# Patient Record
Sex: Female | Born: 1970
Health system: Southern US, Community
[De-identification: ages and names within clinical notes are randomized; demographics above are authoritative.]

## PROBLEM LIST (undated history)

## (undated) DIAGNOSIS — F419 Anxiety disorder, unspecified: Secondary | ICD-10-CM

## (undated) DIAGNOSIS — F329 Major depressive disorder, single episode, unspecified: Secondary | ICD-10-CM

## (undated) DIAGNOSIS — L309 Dermatitis, unspecified: Secondary | ICD-10-CM

## (undated) DIAGNOSIS — IMO0002 Reserved for concepts with insufficient information to code with codable children: Secondary | ICD-10-CM

## (undated) DIAGNOSIS — I10 Essential (primary) hypertension: Secondary | ICD-10-CM

## (undated) DIAGNOSIS — E785 Hyperlipidemia, unspecified: Secondary | ICD-10-CM

## (undated) DIAGNOSIS — M1990 Unspecified osteoarthritis, unspecified site: Secondary | ICD-10-CM

## (undated) DIAGNOSIS — F32A Depression, unspecified: Secondary | ICD-10-CM

## (undated) DIAGNOSIS — E7800 Pure hypercholesterolemia, unspecified: Secondary | ICD-10-CM

## (undated) DIAGNOSIS — E0590 Thyrotoxicosis, unspecified without thyrotoxic crisis or storm: Secondary | ICD-10-CM

## (undated) DIAGNOSIS — R87619 Unspecified abnormal cytological findings in specimens from cervix uteri: Secondary | ICD-10-CM

## (undated) HISTORY — DX: Dermatitis, unspecified: L30.9

## (undated) HISTORY — DX: Hyperlipidemia, unspecified: E78.5

## (undated) HISTORY — DX: Unspecified abnormal cytological findings in specimens from cervix uteri: R87.619

## (undated) HISTORY — PX: WISDOM TOOTH EXTRACTION: SHX21

## (undated) HISTORY — DX: Pure hypercholesterolemia, unspecified: E78.00

## (undated) HISTORY — DX: Essential (primary) hypertension: I10

## (undated) HISTORY — DX: Reserved for concepts with insufficient information to code with codable children: IMO0002

## (undated) HISTORY — DX: Thyrotoxicosis, unspecified without thyrotoxic crisis or storm: E05.90

## (undated) HISTORY — PX: GYNECOLOGIC CRYOSURGERY: SHX857

---

## 1998-06-30 ENCOUNTER — Other Ambulatory Visit: Admission: RE | Admit: 1998-06-30 | Discharge: 1998-06-30 | Payer: Self-pay | Admitting: Obstetrics and Gynecology

## 1999-09-14 ENCOUNTER — Other Ambulatory Visit: Admission: RE | Admit: 1999-09-14 | Discharge: 1999-09-14 | Payer: Self-pay | Admitting: Obstetrics and Gynecology

## 2000-01-24 ENCOUNTER — Other Ambulatory Visit: Admission: RE | Admit: 2000-01-24 | Discharge: 2000-01-24 | Payer: Self-pay | Admitting: Obstetrics and Gynecology

## 2000-08-22 ENCOUNTER — Inpatient Hospital Stay (HOSPITAL_COMMUNITY): Admission: AD | Admit: 2000-08-22 | Discharge: 2000-08-22 | Payer: Self-pay | Admitting: Obstetrics and Gynecology

## 2000-12-25 ENCOUNTER — Inpatient Hospital Stay (HOSPITAL_COMMUNITY): Admission: AD | Admit: 2000-12-25 | Discharge: 2000-12-28 | Payer: Self-pay | Admitting: Obstetrics and Gynecology

## 2001-01-28 ENCOUNTER — Other Ambulatory Visit: Admission: RE | Admit: 2001-01-28 | Discharge: 2001-01-28 | Payer: Self-pay | Admitting: Obstetrics and Gynecology

## 2002-02-23 ENCOUNTER — Other Ambulatory Visit: Admission: RE | Admit: 2002-02-23 | Discharge: 2002-02-23 | Payer: Self-pay | Admitting: Obstetrics and Gynecology

## 2002-10-11 ENCOUNTER — Other Ambulatory Visit: Admission: RE | Admit: 2002-10-11 | Discharge: 2002-10-11 | Payer: Self-pay | Admitting: Obstetrics and Gynecology

## 2003-03-17 ENCOUNTER — Inpatient Hospital Stay (HOSPITAL_COMMUNITY): Admission: AD | Admit: 2003-03-17 | Discharge: 2003-03-17 | Payer: Self-pay | Admitting: Obstetrics and Gynecology

## 2003-04-08 ENCOUNTER — Inpatient Hospital Stay (HOSPITAL_COMMUNITY): Admission: AD | Admit: 2003-04-08 | Discharge: 2003-04-11 | Payer: Self-pay | Admitting: Obstetrics and Gynecology

## 2003-05-17 ENCOUNTER — Other Ambulatory Visit: Admission: RE | Admit: 2003-05-17 | Discharge: 2003-05-17 | Payer: Self-pay | Admitting: Obstetrics and Gynecology

## 2004-07-20 ENCOUNTER — Other Ambulatory Visit: Admission: RE | Admit: 2004-07-20 | Discharge: 2004-07-20 | Payer: Self-pay | Admitting: Obstetrics and Gynecology

## 2004-08-03 ENCOUNTER — Ambulatory Visit: Payer: Self-pay | Admitting: Family Medicine

## 2004-12-18 ENCOUNTER — Other Ambulatory Visit: Admission: RE | Admit: 2004-12-18 | Discharge: 2004-12-18 | Payer: Self-pay | Admitting: Obstetrics and Gynecology

## 2004-12-18 LAB — HM HIV SCREENING LAB: HM HIV Screening: NEGATIVE

## 2004-12-18 LAB — HM HEPATITIS C SCREENING LAB: HM Hepatitis Screen: NEGATIVE

## 2004-12-18 LAB — CHLAMYDIA SCREEN: Chlamydia, DNA Probe: NEGATIVE

## 2009-12-13 ENCOUNTER — Emergency Department (HOSPITAL_COMMUNITY): Admission: EM | Admit: 2009-12-13 | Discharge: 2009-12-13 | Payer: Self-pay | Admitting: Family Medicine

## 2010-04-07 ENCOUNTER — Encounter: Payer: Self-pay | Admitting: Obstetrics and Gynecology

## 2010-08-03 NOTE — Op Note (Signed)
[name], Angela Wells                             ACCOUNT NO.:  [id number]   MEDICAL RECORD NO.:  [id number]                   PATIENT TYPE:  INP   LOCATION:  9311                                 FACILITY:  [facility]   PHYSICIAN:  Dineen Kid. Rana Snare, M.D.                 DATE OF BIRTH:  28-Nov-1970   DATE OF PROCEDURE:  04/09/2003  DATE OF DISCHARGE:                                 OPERATIVE REPORT   DELIVERY NOTE:  The patient presented at 37-1/2 weeks in early labor.  He was given IV  antibiotics.  Labor was augmented with rupture of membranes.  She progressed  to complete.  The infant was in the right occipitoposterior presentation.  She pushed approximately one hour.  Fetal heart rate now tachycardic in the  190s-200s with variable decelerations.  The patient does have group B  Streptococcus, but has been given IV antibiotics.  The vertex was manually  rotated from ROP to LOA, +3 station.  Continued with deep variables and  fetal tachycardia.  With maternal exhaustion, the patient requested vacuum  extractor.  The risks and benefits were discussed at length.  Informed  consent was obtained.  The Mityvac vacuum extractor was applied and with one  very easy pull delivered a viable female infant.  Apgars were 8 and 8.  The  placenta delivered spontaneously intact with a three-vessel cord.  No  lacerations were noted.  Mother and baby were doing well.  The estimated  blood loss was 400 mL.  The arterial pH returned at 7.04.                                               Dineen Kid Rana Snare, M.D.    DCL/MEDQ  D:  04/10/2003  T:  04/10/2003  Job:  [id number]

## 2010-08-17 HISTORY — PX: IUD REMOVAL: SHX5392

## 2010-10-23 ENCOUNTER — Other Ambulatory Visit: Payer: Self-pay | Admitting: Obstetrics & Gynecology

## 2010-10-23 DIAGNOSIS — Z1231 Encounter for screening mammogram for malignant neoplasm of breast: Secondary | ICD-10-CM

## 2011-02-05 ENCOUNTER — Encounter (HOSPITAL_COMMUNITY): Payer: Self-pay | Admitting: *Deleted

## 2011-02-25 ENCOUNTER — Ambulatory Visit (HOSPITAL_COMMUNITY)
Admission: RE | Admit: 2011-02-25 | Discharge: 2011-02-25 | Disposition: A | Discharge status: Home / Self Care | Payer: 59 | Source: Ambulatory Visit | Attending: Obstetrics & Gynecology | Admitting: Obstetrics & Gynecology

## 2011-02-25 DIAGNOSIS — Z1231 Encounter for screening mammogram for malignant neoplasm of breast: Secondary | ICD-10-CM | POA: Insufficient documentation

## 2011-02-26 ENCOUNTER — Encounter (HOSPITAL_COMMUNITY): Payer: Self-pay

## 2011-03-03 MED ORDER — CEFAZOLIN SODIUM-DEXTROSE 2-3 GM-% IV SOLR
2.0000 g | INTRAVENOUS | Status: AC
  Administered 2011-03-04: 2 g via INTRAVENOUS
  Filled 2011-03-03: qty 50

## 2011-03-04 ENCOUNTER — Encounter (HOSPITAL_COMMUNITY): Payer: Self-pay | Admitting: Anesthesiology

## 2011-03-04 ENCOUNTER — Ambulatory Visit (HOSPITAL_COMMUNITY): Payer: 59 | Admitting: Anesthesiology

## 2011-03-04 ENCOUNTER — Ambulatory Visit (HOSPITAL_COMMUNITY)
Admission: RE | Admit: 2011-03-04 | Discharge: 2011-03-04 | Disposition: A | Discharge status: Home / Self Care | Payer: 59 | Source: Ambulatory Visit | Attending: Obstetrics & Gynecology | Admitting: Obstetrics & Gynecology

## 2011-03-04 ENCOUNTER — Encounter (HOSPITAL_COMMUNITY)
Admission: RE | Disposition: A | Discharge status: Home / Self Care | Payer: Self-pay | Source: Ambulatory Visit | Attending: Obstetrics & Gynecology

## 2011-03-04 ENCOUNTER — Encounter (HOSPITAL_COMMUNITY): Payer: Self-pay | Admitting: *Deleted

## 2011-03-04 DIAGNOSIS — Z302 Encounter for sterilization: Secondary | ICD-10-CM

## 2011-03-04 HISTORY — DX: Anxiety disorder, unspecified: F41.9

## 2011-03-04 HISTORY — PX: LAPAROSCOPIC TUBAL LIGATION: SHX1937

## 2011-03-04 HISTORY — PX: TUBAL LIGATION: SHX77

## 2011-03-04 LAB — CBC
Hemoglobin: 14.1 g/dL (ref 12.0–15.0)
Platelets: 266 10*3/uL (ref 150–400)
RBC: 4.59 MIL/uL (ref 3.87–5.11)
WBC: 8.6 10*3/uL (ref 4.0–10.5)

## 2011-03-04 LAB — HCG, SERUM, QUALITATIVE: Preg, Serum: NEGATIVE

## 2011-03-04 SURGERY — ESSURE TUBAL STERILIZATION
Anesthesia: General | Site: Uterus | Laterality: Bilateral | Wound class: Clean Contaminated

## 2011-03-04 MED ORDER — ROCURONIUM BROMIDE 50 MG/5ML IV SOLN
INTRAVENOUS | Status: AC
  Filled 2011-03-04: qty 1

## 2011-03-04 MED ORDER — LACTATED RINGERS IV SOLN
INTRAVENOUS | Status: DC
  Administered 2011-03-04 (×3): via INTRAVENOUS

## 2011-03-04 MED ORDER — FENTANYL CITRATE 0.05 MG/ML IJ SOLN
25.0000 ug | INTRAMUSCULAR | Status: DC | PRN
  Administered 2011-03-04: 50 ug via INTRAVENOUS
  Administered 2011-03-04: 25 ug via INTRAVENOUS
  Administered 2011-03-04: 50 ug via INTRAVENOUS
  Administered 2011-03-04: 25 ug via INTRAVENOUS

## 2011-03-04 MED ORDER — NEOSTIGMINE METHYLSULFATE 1 MG/ML IJ SOLN
INTRAMUSCULAR | Status: DC | PRN
  Administered 2011-03-04: 4 mg via INTRAVENOUS

## 2011-03-04 MED ORDER — PROPOFOL 10 MG/ML IV BOLUS
INTRAVENOUS | Status: DC | PRN
  Administered 2011-03-04: 120 mg via INTRAVENOUS
  Administered 2011-03-04: 80 mg via INTRAVENOUS
  Administered 2011-03-04: 180 mg via INTRAVENOUS

## 2011-03-04 MED ORDER — FENTANYL CITRATE 0.05 MG/ML IJ SOLN
INTRAMUSCULAR | Status: AC
  Filled 2011-03-04: qty 2

## 2011-03-04 MED ORDER — KETOROLAC TROMETHAMINE 30 MG/ML IJ SOLN
INTRAMUSCULAR | Status: AC
  Filled 2011-03-04: qty 1

## 2011-03-04 MED ORDER — GLYCOPYRROLATE 0.2 MG/ML IJ SOLN
INTRAMUSCULAR | Status: DC | PRN
  Administered 2011-03-04: .07 mg via INTRAVENOUS

## 2011-03-04 MED ORDER — GLYCOPYRROLATE 0.2 MG/ML IJ SOLN
INTRAMUSCULAR | Status: AC
  Filled 2011-03-04: qty 2

## 2011-03-04 MED ORDER — ROCURONIUM BROMIDE 100 MG/10ML IV SOLN
INTRAVENOUS | Status: DC | PRN
  Administered 2011-03-04: 40 mg via INTRAVENOUS

## 2011-03-04 MED ORDER — KETOROLAC TROMETHAMINE 30 MG/ML IJ SOLN
INTRAMUSCULAR | Status: DC | PRN
  Administered 2011-03-04: 30 mg via INTRAVENOUS

## 2011-03-04 MED ORDER — ONDANSETRON HCL 4 MG/2ML IJ SOLN
INTRAMUSCULAR | Status: AC
  Filled 2011-03-04: qty 2

## 2011-03-04 MED ORDER — FENTANYL CITRATE 0.05 MG/ML IJ SOLN
INTRAMUSCULAR | Status: AC
  Administered 2011-03-04: 25 ug via INTRAVENOUS
  Filled 2011-03-04: qty 2

## 2011-03-04 MED ORDER — KETOROLAC TROMETHAMINE 30 MG/ML IJ SOLN: 15.0000 mg | Freq: Once | INTRAMUSCULAR | Status: DC | PRN

## 2011-03-04 MED ORDER — PROPOFOL 10 MG/ML IV EMUL
INTRAVENOUS | Status: AC
  Filled 2011-03-04: qty 20

## 2011-03-04 MED ORDER — LIDOCAINE-EPINEPHRINE 1 %-1:100000 IJ SOLN
INTRAMUSCULAR | Status: DC | PRN
  Administered 2011-03-04: 10 mL

## 2011-03-04 MED ORDER — SODIUM CHLORIDE 0.9 % IR SOLN
Status: DC | PRN
  Administered 2011-03-04: 3000 mL

## 2011-03-04 MED ORDER — FENTANYL CITRATE 0.05 MG/ML IJ SOLN
INTRAMUSCULAR | Status: DC | PRN
  Administered 2011-03-04: 100 ug via INTRAVENOUS

## 2011-03-04 MED ORDER — HYDROCODONE-ACETAMINOPHEN 5-325 MG PO TABS
1.0000 | ORAL_TABLET | Freq: Once | ORAL | Status: AC
  Administered 2011-03-04: 1 via ORAL

## 2011-03-04 MED ORDER — LIDOCAINE HCL (CARDIAC) 20 MG/ML IV SOLN
INTRAVENOUS | Status: AC
  Filled 2011-03-04: qty 5

## 2011-03-04 MED ORDER — DEXAMETHASONE SODIUM PHOSPHATE 4 MG/ML IJ SOLN
INTRAMUSCULAR | Status: DC | PRN
  Administered 2011-03-04: 10 mg via INTRAVENOUS

## 2011-03-04 MED ORDER — DEXAMETHASONE SODIUM PHOSPHATE 10 MG/ML IJ SOLN
INTRAMUSCULAR | Status: AC
  Filled 2011-03-04: qty 1

## 2011-03-04 MED ORDER — MEPERIDINE HCL 25 MG/ML IJ SOLN
INTRAMUSCULAR | Status: DC | PRN
  Administered 2011-03-04: 25 mg via INTRAVENOUS

## 2011-03-04 MED ORDER — NEOSTIGMINE METHYLSULFATE 1 MG/ML IJ SOLN
INTRAMUSCULAR | Status: AC
  Filled 2011-03-04: qty 10

## 2011-03-04 MED ORDER — LIDOCAINE HCL (CARDIAC) 20 MG/ML IV SOLN
INTRAVENOUS | Status: DC | PRN
  Administered 2011-03-04: 60 mg via INTRAVENOUS

## 2011-03-04 MED ORDER — HYDROCODONE-ACETAMINOPHEN 5-325 MG PO TABS
ORAL_TABLET | ORAL | Status: AC
  Filled 2011-03-04: qty 1

## 2011-03-04 MED ORDER — ONDANSETRON HCL 4 MG/2ML IJ SOLN
INTRAMUSCULAR | Status: DC | PRN
  Administered 2011-03-04: 4 mg via INTRAVENOUS

## 2011-03-04 MED ORDER — MIDAZOLAM HCL 2 MG/2ML IJ SOLN
INTRAMUSCULAR | Status: AC
  Filled 2011-03-04: qty 2

## 2011-03-04 MED ORDER — BUPIVACAINE HCL 0.25 % IJ SOLN
INTRAMUSCULAR | Status: DC | PRN
  Administered 2011-03-04: 4 mL

## 2011-03-04 MED ORDER — MEPERIDINE HCL 25 MG/ML IJ SOLN
INTRAMUSCULAR | Status: AC
  Filled 2011-03-04: qty 1

## 2011-03-04 SURGICAL SUPPLY — 27 items
ADH SKN CLS APL DERMABOND .7 (GAUZE/BANDAGES/DRESSINGS) ×2
CATH ROBINSON RED A/P 16FR (CATHETERS) ×3 IMPLANT
CLIP FILSHIE TUBAL LIGA STRL (Clip) ×1 IMPLANT
CLOTH BEACON ORANGE TIMEOUT ST (SAFETY) ×3 IMPLANT
CONTAINER PREFILL 10% NBF 60ML (FORM) ×4 IMPLANT
COVER LIGHT HANDLE  1/PK (MISCELLANEOUS) ×2
COVER LIGHT HANDLE 1/PK (MISCELLANEOUS) IMPLANT
DERMABOND ADVANCED (GAUZE/BANDAGES/DRESSINGS) ×1
DERMABOND ADVANCED .7 DNX12 (GAUZE/BANDAGES/DRESSINGS) IMPLANT
ELECT REM PT RETURN 9FT ADLT (ELECTROSURGICAL) ×3
ELECTRODE REM PT RTRN 9FT ADLT (ELECTROSURGICAL) IMPLANT
GAUZE SPONGE 4X4 16PLY XRAY LF (GAUZE/BANDAGES/DRESSINGS) ×1 IMPLANT
GLOVE BIOGEL PI IND STRL 7.0 (GLOVE) ×2 IMPLANT
GLOVE BIOGEL PI INDICATOR 7.0 (GLOVE) ×2
GLOVE ECLIPSE 6.5 STRL STRAW (GLOVE) ×6 IMPLANT
GOWN PREVENTION PLUS LG XLONG (DISPOSABLE) ×7 IMPLANT
NDL INSUFFLATION 14GA 120MM (NEEDLE) IMPLANT
NEEDLE INSUFFLATION 14GA 120MM (NEEDLE) ×3 IMPLANT
PACK LAPAROSCOPY BASIN (CUSTOM PROCEDURE TRAY) ×1 IMPLANT
PENCIL BUTTON HOLSTER BLD 10FT (ELECTRODE) ×1 IMPLANT
SUT VIC AB 2-0 FS1 27 (SUTURE) ×1 IMPLANT
SUT VIC AB 3-0 PS2 18 (SUTURE) ×1 IMPLANT
SUT VICRYL 0 UR6 27IN ABS (SUTURE) ×1 IMPLANT
TOWEL OR 17X24 6PK STRL BLUE (TOWEL DISPOSABLE) ×6 IMPLANT
TROCAR XCEL NON-BLD 11X100MML (ENDOMECHANICALS) ×1 IMPLANT
TUBING HYDROFLEX HYSTEROSCOPY (TUBING) ×1 IMPLANT
WATER STERILE IRR 1000ML POUR (IV SOLUTION) ×3 IMPLANT

## 2011-03-04 NOTE — Transfer of Care (Signed)
Immediate Anesthesia Transfer of Care Note  Patient: Angela Wells  Procedure(s) Performed:  ESSURE TUBAL STERILIZATION - Attempted; LAPAROSCOPIC TUBAL LIGATION - with filshie clips  Patient Location: PACU  Anesthesia Type: General  Level of Consciousness: sedated  Airway & Oxygen Therapy: Patient Spontanous Breathing and Patient connected to nasal cannula oxygen  Post-op Assessment: Report given to PACU RN  Post vital signs: Reviewed and stable  Complications: No apparent anesthesia complications

## 2011-03-04 NOTE — OR Nursing (Signed)
Procedure Attempted Essure sterilization, Hysteroscopy, Laparoscopic Tubal sterilization with Filshie Clips.

## 2011-03-04 NOTE — Anesthesia Preprocedure Evaluation (Signed)
Anesthesia Evaluation  Patient identified by MRN, date of birth, ID band Patient awake    Reviewed: Allergy & Precautions, H&P , NPO status , Patient's Chart, lab work & pertinent test results, reviewed documented beta blocker date and time   History of Anesthesia Complications Negative for: history of anesthetic complications  Airway Mallampati: I TM Distance: >3 FB Neck ROM: full    Dental  (+) Teeth Intact   Pulmonary neg pulmonary ROS,  clear to auscultation  Pulmonary exam normal       Cardiovascular Exercise Tolerance: Good neg cardio ROS regular Normal    Neuro/Psych PSYCHIATRIC DISORDERS (anxiety) Negative Neurological ROS     GI/Hepatic negative GI ROS, Neg liver ROS,   Endo/Other  Negative Endocrine ROS  Renal/GU negative Renal ROS  Genitourinary negative   Musculoskeletal   Abdominal   Peds  Hematology negative hematology ROS (+)   Anesthesia Other Findings   Reproductive/Obstetrics negative OB ROS                           Anesthesia Physical Anesthesia Plan  ASA: I  Anesthesia Plan: General LMA   Post-op Pain Management:    Induction:   Airway Management Planned:   Additional Equipment:   Intra-op Plan:   Post-operative Plan:   Informed Consent: I have reviewed the patients History and Physical, chart, labs and discussed the procedure including the risks, benefits and alternatives for the proposed anesthesia with the patient or authorized representative who has indicated his/her understanding and acceptance.   Dental Advisory Given  Plan Discussed with: CRNA and Surgeon  Anesthesia Plan Comments:         Anesthesia Quick Evaluation

## 2011-03-04 NOTE — H&P (Signed)
Angela Wells is an 40 y.o. female who is desirous of permanent sterilization.  Patient, her husband, and I all discussed the options including laparoscopic BTL, Essure tubal occlusion, vasectomy, IUD, oral and injectable contraceptives and patient has decided to proceed with Essure procedure.  She does want to have her tubes tied, no matter what, and if I am unable to do this with the Essure, she wants to proceed with laparoscopic BTL.  Pertinent Gynecological History: Menses: irregular spotting since on the micronor Bleeding: dysfunctional on micronor, otherwise normal Contraception: oral progesterone-only contraceptive DES exposure: denies Blood transfusions: none Sexually transmitted diseases: no past history Previous GYN Procedures: none  Last mammogram: normal Date: 12/12 Last pap: normal Date: 12/12  OB History: G4, P2   Menstrual History: Menarche age: 10 Patient's last menstrual period was 01/28/2011.    Past Medical History  Diagnosis Date  . Anxiety     Past Surgical History  Procedure Date  . No past surgeries     History reviewed. No pertinent family history.  Social History:  reports that she has never smoked. She does not have any smokeless tobacco history on file. She reports that she drinks alcohol. She reports that she does not use illicit drugs.  Allergies: No Known Allergies  No prescriptions prior to admission    Review of Systems  Constitutional: Negative for fever and chills.  Eyes: Negative for blurred vision.  Respiratory: Negative for cough.   Cardiovascular: Negative for chest pain and palpitations.  Gastrointestinal: Negative for heartburn.  Genitourinary: Negative for dysuria.  Musculoskeletal: Negative for myalgias.  Skin: Negative for rash.  Neurological: Negative for dizziness and headaches.  Endo/Heme/Allergies: Does not bruise/bleed easily.  Psychiatric/Behavioral: Negative for depression.    Last menstrual period  01/28/2011. Physical Exam  Constitutional: She is oriented to person, place, and time. She appears well-developed and well-nourished.  HENT:  Head: Normocephalic and atraumatic.  Neck: Normal range of motion. Neck supple. No thyromegaly present.  Cardiovascular: Normal rate and regular rhythm.   Respiratory: Effort normal and breath sounds normal.  GI: Soft. Bowel sounds are normal. She exhibits no distension.  Genitourinary: Vagina normal and uterus normal.  Musculoskeletal: Normal range of motion.  Neurological: She is alert and oriented to person, place, and time.  Skin: Skin is warm and dry.  Psychiatric: She has a normal mood and affect.    No results found for this or any previous visit (from the past 24 hour(s)).  No results found.  Assessment/Plan: 40 year old WF here for Essure tubal occlusion.  Risks and benefits all discussed in my office and are documented in patient's chart.  Patient is ready to proceed.  Angela Wells,M SUZANNE 03/04/2011, 6:57 AM

## 2011-03-04 NOTE — Op Note (Signed)
03/04/2011  1:49 PM  PATIENT:  Angela Wells  40 y.o. female G46 P2 MWF who desires permanent sterilization  PRE-OPERATIVE DIAGNOSIS:  desires sterilization  POST-OPERATIVE DIAGNOSIS:  desires sterilization  PROCEDURE:  Procedure(s): ATTEMPTED HYSTEROSCOPIC ESSURE TUBAL OCCLUSION, LAPAROSCOPIC TUBAL LIGATION  SURGEON:  Drakkar Medeiros,M SUZANNE  ASSISTANTS: OR staff   ANESTHESIA:   LMA and general  ESTIMATED BLOOD LOSS: * No blood loss amount entered *  BLOOD ADMINISTERED:none   FLUIDS: 1700cc LR  UOP: 250cc clear drained   SPECIMEN:  none  DISPOSITION OF SPECIMEN:  N/A  FINDINGS: with hysteroscopy, endometrial cavity was clean.  Left ostium clearly visible.  Right ostium could not be seen due to endometrial fluffiness and possible scar.  With laparoscopy, normal tubes, ovaries, uterus, and normal upper abdomen  DESCRIPTION OF OPERATION:  Patient was taken to the operating room. She is placed in the supine position.  Legs were placed in the low lithotomy position in [place] stirrups. SCDs were on the lower shortness bilaterally and functioning properly. Patient was comfortable before anesthesia was administered. Dr. Rodman Pickle oversaw case. An LMA was placed without difficulty. Timeout was performed. The abdomen was prepped with chlor prep.  T and he inner thighs vagina and perineum were prepped with Betadine x3. The patient was then draped in a normal sterile fashion. Legs are lifted to the high lithotomy position. A bivalve speculum was placed in the vagina and the anterior lip of the cervix was grasped with single-tooth tenaculum. A paracervical block of 1% lidocaine mixed one-to-one with epinephrine (1-100,000 units) was instilled. 10 cc total was used. The uterus sounded to 10 cm. The cervix was dilated up to #21 [facility] dilator. Then a 3.9 mm operative hysteroscope was obtained. Normal saline was the hysteroscopic fluid was used for the procedure. This was passed and endometrial cavity. The left  tubal ostium was noted but the right tubal ostium was very difficult see. There is little fluffy endometrial tissue as well as some scarring is present. The hysteroscopic grasper was obtained and an attempt was made to try and tease this tissue away so I could better see the tubal ostium. This was unsuccessful.  I did actually attempt to pass the Essure device into the tube and this was unsuccessful because I just could not fully identify the tubal opening.   After spending approximately 25 minutes doing this and fluid deficit of 300 cc of normal saline for this portion of the procedure, I decided to stop with the hysteroscopic portion.  Speculum was placed in the vagina.  A single toothed tenaculum was attached to the anterior lip of the cervix.  An acorn uterine manipulator was attached to the tenaculum as a means of manipulating the uterus.  Legs were lowered to the low lithotomy position. The anesthesia was changed to general. Quarter percent Marcaine was used to anesthetize the umbilicus. A 10 mm skin incision was made 11 blade. A Veress needle was obtained. The valve was open.  While lifting the abdomen, the needle was passed through the abdominal layers.  A syringe of CO2 with normal saline attached. An aspiration was performed without any blood or fluid being noted.  Fluid was injected easily into the needle. Another aspiration was performed and no blood, fluid, or saline was obtained. Pneumoperitoneum was achieved with low flow CO2 gas. The pressure never got above 8 mm mercury. Once the 2.5 liters of CO2 gas was in the abdomen, the Veress needle was removed. Then a 10 mm diagnostic laparoscope  was attached to an Optiview port. Using a twisting motion this is passed to the fascial layer and into the abdomen. A survey of the pelvis and upper abdomen was performed. No abnormal findings were noted.  The patient was placed in Trendelenburg positioning. With the uterus placed on stretch to stretch to the left  side, a Filshie clip was placed along the isthmic region of the fallopian tube on the right side.  The fimbriated end of this tube was seen.  Then in a similar fashion with the uterus on stretch to the opposite side the left tube was clamped in the isthmic region with a Filshie clip. At this point photodocumentation was made. Excellent placement of the Filshie clips was noted. There is no bleeding noted. The procedure was ended at this point. The laparoscope was removed. The CRNA give patient several deep breaths in an attempt to remove any gas from the abdomen. Then the port was removed. The incision was closed first at the fascial level with a figure-of-eight suture of #0 Vicryl. Then the skin was closed with subcuticular stitch of 0 Vicryl. The incision was cleansed and Dermabond was placed. The vaginal instruments were removed. There is no bleeding noted from the cervix. The legs were lower back to this low lithotomy position. Patient is in place and the sponge stick position. The skin prep like a bad been. Sponge, lap, instrument, needles were correct or counts were correct x2. Patient was later anesthesia and taken to the room in stable condition.  COUNTS:  YES  PLAN OF CARE: Transfer to PACU

## 2011-03-04 NOTE — Anesthesia Postprocedure Evaluation (Signed)
  Anesthesia Post-op Note  Patient: Angela Wells  Procedure(s) Performed:  ESSURE TUBAL STERILIZATION - Attempted; LAPAROSCOPIC TUBAL LIGATION - with filshie clips  Patient is awake and responsive. Pain and nausea are reasonably well controlled. Vital signs are stable and clinically acceptable. Oxygen saturation is clinically acceptable. There are no apparent anesthetic complications at this time. Patient is ready for discharge.

## 2011-03-05 ENCOUNTER — Encounter (HOSPITAL_COMMUNITY): Payer: Self-pay | Admitting: Obstetrics & Gynecology

## 2011-12-30 ENCOUNTER — Other Ambulatory Visit: Payer: Self-pay | Admitting: Obstetrics & Gynecology

## 2011-12-30 DIAGNOSIS — Z1231 Encounter for screening mammogram for malignant neoplasm of breast: Secondary | ICD-10-CM

## 2012-03-26 ENCOUNTER — Ambulatory Visit (HOSPITAL_COMMUNITY): Payer: 59

## 2012-05-08 ENCOUNTER — Ambulatory Visit (HOSPITAL_COMMUNITY): Payer: 59 | Attending: Obstetrics & Gynecology

## 2012-09-21 ENCOUNTER — Telehealth: Payer: Self-pay | Admitting: Obstetrics & Gynecology

## 2012-09-21 NOTE — Telephone Encounter (Signed)
pt is going to the beach and wants to know if there is something she can take to stop her cycle

## 2012-09-21 NOTE — Telephone Encounter (Signed)
See note below.  [facility] 08/30/2012 for 8-9 days. Patient states since her tubal ligation cycles have been all over the place. Will be at beach for 2 weeks and request to not be bothered with this. She is leaving Saturday for the beach. Please advise.

## 2012-09-22 MED ORDER — PROGESTERONE MICRONIZED 200 MG PO CAPS: 200.0000 mg | ORAL_CAPSULE | Freq: Every day | ORAL | Status: DC

## 2012-09-22 NOTE — Telephone Encounter (Signed)
Per Dr Hyacinth Meeker, Patient could try Prometrium 200mg  QHS and continue until trip is over. Advised that this may not work and that she may feel bloated and have PMS type symptoms while on this medication. Patient will need to consider if potential symptoms are worth the potential benefit of missing menses.  For best result, should start ASAP (patient's pharmacy is already closed for the night so she will have to wait till tomm).  She requests to have rx sent in and she will consider all of this information before deciding to pick it up.

## 2012-09-22 NOTE — Telephone Encounter (Signed)
Since she isn't on ocp, we cannot do a cycle delay without her start of flow, if she just starts ocp, she will have nothing but break thru bleeding, i'm sorry

## 2012-09-22 NOTE — Telephone Encounter (Signed)
Last AEX 04-2012 by Dr Farrel Gobble  Non smoker Last MMG 02-2011 at [facility]. No recent result noted.  Intolerance of OCP in past due to mood changes, noted on 2012 NGYN exam (see paper chart)  Anything to try?

## 2012-10-14 ENCOUNTER — Other Ambulatory Visit: Payer: Self-pay | Admitting: Obstetrics & Gynecology

## 2012-10-14 DIAGNOSIS — Z1231 Encounter for screening mammogram for malignant neoplasm of breast: Secondary | ICD-10-CM

## 2012-10-29 ENCOUNTER — Ambulatory Visit: Payer: 59

## 2012-11-11 ENCOUNTER — Ambulatory Visit
Admission: RE | Admit: 2012-11-11 | Discharge: 2012-11-11 | Disposition: A | Discharge status: Home / Self Care | Payer: 59 | Source: Ambulatory Visit | Attending: Obstetrics & Gynecology | Admitting: Obstetrics & Gynecology

## 2012-11-11 DIAGNOSIS — Z1231 Encounter for screening mammogram for malignant neoplasm of breast: Secondary | ICD-10-CM

## 2012-12-14 ENCOUNTER — Telehealth: Payer: Self-pay | Admitting: Emergency Medicine

## 2012-12-14 NOTE — Telephone Encounter (Signed)
Patient calling with request for appointment with Dr. Hyacinth Meeker. Having some irregular bleeding and has questions regarding bleeding and menopause. Denies increased vaginal bleeding states "it's not urgent, I'd just like to talk to Dr. Hyacinth Meeker about it".  OV appointment scheduled.   Chart routed for signature.

## 2012-12-22 ENCOUNTER — Encounter: Payer: Self-pay | Admitting: Obstetrics & Gynecology

## 2012-12-24 ENCOUNTER — Ambulatory Visit (INDEPENDENT_AMBULATORY_CARE_PROVIDER_SITE_OTHER): Payer: 59 | Admitting: Obstetrics & Gynecology

## 2012-12-24 ENCOUNTER — Encounter: Payer: Self-pay | Admitting: Obstetrics & Gynecology

## 2012-12-24 VITALS — BP 138/84 | HR 60 | Resp 12 | Ht 66.75 in | Wt 210.2 lb

## 2012-12-24 DIAGNOSIS — N949 Unspecified condition associated with female genital organs and menstrual cycle: Secondary | ICD-10-CM

## 2012-12-24 DIAGNOSIS — N938 Other specified abnormal uterine and vaginal bleeding: Secondary | ICD-10-CM

## 2012-12-24 MED ORDER — NORETHIN ACE-ETH ESTRAD-FE 1-20 MG-MCG(24) PO CHEW: 1.0000 | CHEWABLE_TABLET | Freq: Every day | ORAL | Status: DC

## 2012-12-24 NOTE — Patient Instructions (Signed)
Please call and give me an update in about a month or if you have any unacceptable side effects.

## 2012-12-24 NOTE — Progress Notes (Signed)
42 y.o. Married Caucasian female G4P2 here to discuss irregular cycles.  Brought menstrual calendar.  Bleeding is all over the place.  In September, for example, she bled or spotted all fur 7 days.  She is feeling hormonally all over the place as well.  States sometimes she "hates herself".  No suicidal ideations.  Affect is normal.  Just doesn't like the way she feels.    We discussed options including OCPs, micronor, Mirena (which she used in the past but was "lodged" in her cervix and she declines use again), and endometrial ablation.  U/S and endometrial biopsy evaluation of uterus before procedure discussed.  Pt would like to try something conservative first.  OCPs discussed.  Risks including but not limited to headache, DUB, elevated BP, DVT, PE discussed.  Will try monophasic pill.  Instructions for use given.  She will start today.  If has unacceptable side effects or it doesn't help, she is interested in ablation.  Non smoker.  No h/o DVT, even with child bearing.  No HTN hx.  O: Healthy WD,WN female Affect: nl  A:DUB  P: Trial of Minastrin.  RX for 4 months to pharmacy.  Pt to call and give update in one month or sooner wish any issues.  AEX scheduled for 4/15.    ~15 minutes spent with patient >50% of time was in face to face discussion of above.

## 2013-03-10 ENCOUNTER — Other Ambulatory Visit: Payer: Self-pay | Admitting: *Deleted

## 2013-03-10 MED ORDER — CITALOPRAM HYDROBROMIDE 10 MG PO TABS: 10.0000 mg | ORAL_TABLET | Freq: Every day | ORAL | Status: DC

## 2013-03-10 NOTE — Telephone Encounter (Signed)
Faxed refill request received from [facility] for CITALOPRAM Last filled by MD on 08/19/11, #90 X 2 Last AEX - 04/22/12 Next AEX - 07/08/13  Please advise refills.  Paper chart on your desk.

## 2013-04-12 ENCOUNTER — Telehealth: Payer: Self-pay | Admitting: Obstetrics & Gynecology

## 2013-04-12 NOTE — Telephone Encounter (Signed)
Patient is requesting refills of birth control and generic celexa. Patient request a 3 month refill. [facility]

## 2013-04-13 MED ORDER — NORETHIN ACE-ETH ESTRAD-FE 1-20 MG-MCG(24) PO CHEW: 1.0000 | CHEWABLE_TABLET | Freq: Every day | ORAL | Status: DC

## 2013-04-13 MED ORDER — CITALOPRAM HYDROBROMIDE 10 MG PO TABS: 10.0000 mg | ORAL_TABLET | Freq: Every day | ORAL | Status: DC

## 2013-04-13 NOTE — Telephone Encounter (Signed)
Refill given 03/10/13 for Celexa 10mg  #30 x 0 refills, Minastrin 24 Fe 1 pack x 3 refills given 12/24/12 Annual Exam scheduled  For 07/08/13  Please advise on refill for Celexa. Minastrin refill already sent 1 pack w/ 3 refills until Annual Exam 07/08/13.

## 2013-04-13 NOTE — Telephone Encounter (Signed)
Last AEX 04/22/2012 Last refill of citalopram 03/10/2013 #30/0 refills Next AEX 07/08/2013.  Please advise.

## 2013-05-19 ENCOUNTER — Encounter: Payer: Self-pay | Admitting: Obstetrics & Gynecology

## 2013-07-08 ENCOUNTER — Ambulatory Visit (INDEPENDENT_AMBULATORY_CARE_PROVIDER_SITE_OTHER): Payer: 59 | Admitting: Obstetrics & Gynecology

## 2013-07-08 ENCOUNTER — Encounter: Payer: Self-pay | Admitting: Obstetrics & Gynecology

## 2013-07-08 VITALS — BP 118/80 | HR 60 | Resp 16 | Ht 66.75 in | Wt 212.2 lb

## 2013-07-08 DIAGNOSIS — Z01419 Encounter for gynecological examination (general) (routine) without abnormal findings: Secondary | ICD-10-CM

## 2013-07-08 DIAGNOSIS — R232 Flushing: Secondary | ICD-10-CM

## 2013-07-08 DIAGNOSIS — Z0000 Encounter for general adult medical examination without abnormal findings: Secondary | ICD-10-CM

## 2013-07-08 DIAGNOSIS — Z124 Encounter for screening for malignant neoplasm of cervix: Secondary | ICD-10-CM

## 2013-07-08 DIAGNOSIS — N951 Menopausal and female climacteric states: Secondary | ICD-10-CM

## 2013-07-08 LAB — POCT URINALYSIS DIPSTICK
BILIRUBIN UA: NEGATIVE
Blood, UA: NEGATIVE
Glucose, UA: NEGATIVE
KETONES UA: NEGATIVE
Nitrite, UA: NEGATIVE
PH UA: 7.5
Protein, UA: NEGATIVE
Urobilinogen, UA: NEGATIVE

## 2013-07-08 MED ORDER — CITALOPRAM HYDROBROMIDE 20 MG PO TABS: 20.0000 mg | ORAL_TABLET | Freq: Every day | ORAL | Status: DC

## 2013-07-08 NOTE — Progress Notes (Signed)
43 y.o. G4P2 MarriedCaucasianF here for annual exam.  Seeing Dr. Janie Morning, at [facility], next month.  Having follow up next month.  Newly on BP medications.  Off minastrin x 2 months.  Had one cycle but nothing since.  H/O BTL.  Having more hot flashes.  Mood is good.    Patient's last menstrual period was 05/21/2013.          Sexually active: yes  The current method of family planning is tubal ligation.    Exercising: no  not regularly Smoker:  Former smoker 14 years ago  Health Maintenance: Pap:  02/25/11 WNL/negatve HR HPV History of abnormal Pap:  Yes h/o cryosurgery MMG:  11/11/12-normal, 3D Colonoscopy:  none BMD:   Heel test 2011 TDaP:  4/10 Screening Labs: PCP, Hb today: PCP, Urine today: WBC-trace, PH-7.5   reports that she has quit smoking. She has never used smokeless tobacco. She reports that she drinks about .5 ounces of alcohol per week. She reports that she does not use illicit drugs.  Past Medical History  Diagnosis Date  . Anxiety   . Abnormal Pap smear   . Eczema   . Hypertension     on meds, ? secondary to [facility]    Past Surgical History  Procedure Laterality Date  . Tubal ligation  03/04/2011    Procedure: ESSURE TUBAL STERILIZATION;  Surgeon: Felipa Emory;  Location: [facility];  Service: Gynecology;  Laterality: Bilateral;  Attempted  . Laparoscopic tubal ligation  03/04/2011    Procedure: LAPAROSCOPIC TUBAL LIGATION;  Surgeon: Felipa Emory;  Location: [facility];  Service: Gynecology;  Laterality: Bilateral;  with filshie clips  . Gynecologic cryosurgery    . Iud removal  6/12    in cx    Current Outpatient Prescriptions  Medication Sig Dispense Refill  . BL EVENING PRIMROSE OIL PO Take by mouth daily.      . citalopram (CELEXA) 10 MG tablet Take 1 tablet (10 mg total) by mouth at bedtime.  30 tablet  2  . Multiple Vitamins-Minerals (MULTIVITAMINS THER. W/MINERALS) TABS Take 1 tablet by mouth daily.        Marland Kitchen triamterene-hydrochlorothiazide  (MAXZIDE-25) 37.5-25 MG per tablet Take 1 tablet by mouth daily.       No current facility-administered medications for this visit.    Family History  Problem Relation Age of Onset  . Hypertension Father   . Thyroid disease Mother     ROS:  Pertinent items are noted in HPI.  Otherwise, a comprehensive ROS was negative.  Exam:   BP 118/80  Pulse 60  Resp 16  Ht 5' 6.75" (1.695 m)  Wt 212 lb 3.2 oz (96.253 kg)  BMI 33.50 kg/m2  LMP 05/21/2013  Weight change: -6#   Height: 5' 6.75" (169.5 cm)  Ht Readings from Last 3 Encounters:  07/08/13 5' 6.75" (1.695 m)  12/24/12 5' 6.75" (1.695 m)  03/04/11 5' 6.5" (1.689 m)    General appearance: alert, cooperative and appears stated age Head: Normocephalic, without obvious abnormality, atraumatic Neck: no adenopathy, supple, symmetrical, trachea midline and thyroid normal to inspection and palpation Lungs: clear to auscultation bilaterally Breasts: normal appearance, no masses or tenderness Heart: regular rate and rhythm Abdomen: soft, non-tender; bowel sounds normal; no masses,  no organomegaly Extremities: extremities normal, atraumatic, no cyanosis or edema Skin: Skin color, texture, turgor normal. No rashes or lesions Lymph nodes: Cervical, supraclavicular, and axillary nodes normal. No abnormal inguinal nodes palpated Neurologic: Grossly normal  Pelvic: External genitalia:  no lesions              Urethra:  normal appearing urethra with no masses, tenderness or lesions              Bartholins and Skenes: normal                 Vagina: normal appearing vagina with normal color and discharge, no lesions              Cervix: no lesions              Pap taken: yes Bimanual Exam:  Uterus:  normal size, contour, position, consistency, mobility, non-tender              Adnexa: normal adnexa and no mass, fullness, tenderness               Rectovaginal: Confirms               Anus:  normal sphincter tone, no lesions  A:  Well  Woman with normal exam H/O BTL H/O Eczema Hypertension Hot flashes, off OCPs.  P:   Mammogram yearly.  D/W 3D due to breast density pap smear only today. Neg HR HPV 2012. FSH, Estradiol today. Rx for Celexa 20mg  daily to pharmacy.  #90/4 RF. return annually or prn  An After Visit Summary was printed and given to the patient.

## 2013-07-08 NOTE — Patient Instructions (Signed)

## 2013-07-09 ENCOUNTER — Telehealth: Payer: Self-pay | Admitting: Orthopedic Surgery

## 2013-07-09 LAB — FOLLICLE STIMULATING HORMONE: FSH: 69.2 m[IU]/mL

## 2013-07-09 LAB — ESTRADIOL: ESTRADIOL: 15.7 pg/mL

## 2013-07-09 NOTE — Telephone Encounter (Signed)
LMTCB for results.  ([facility] in-basket)

## 2013-07-09 NOTE — Telephone Encounter (Signed)
Spoke with patient. Results given from [facility] as seen below. Patient states that she may want to consider HRT depending on what her options are. Would consider HRT if Dr.Miller can recommend something that will not effect her blood pressure. Advised would send message to [facility] for recommendations and would give patient a call back. Patient agreeable.  Notes Recorded by Oleta Mouse, RN on 07/09/2013 at 2:48 PM [facility] for results. ------  Notes Recorded by Lyman Speller, MD on 07/09/2013 at 7:03 AM Inform pt labs show she is in menopause. She should not have any more bleeding and needs to call if she does. I don't think she wants to be on HRT but can try black cohosh if hot flashes are really bothersome to her.

## 2013-07-12 LAB — IPS PAP SMEAR ONLY

## 2013-07-12 NOTE — Telephone Encounter (Signed)
Well, she can always start with OTC products that are safe and will no increase BP.  First would be black cohosh or Estroven (which contains black cohosh).  Can take black cohosh once or twice daily.  Estroven as per product instructions.  Start with this--may be all she needs.  Use for one month before deciding on starting HRT.

## 2013-07-12 NOTE — Telephone Encounter (Signed)
Left message to call Kaitlyn at [phone]. 

## 2013-07-12 NOTE — Telephone Encounter (Signed)
Spoke with patient. Message from [facility] given as seen below. Patient agreeable and verbalizes understanding. Will call back after a month if OTC does not relieve symptoms.  Routing to provider for final review. Patient agreeable to disposition. Will close encounter

## 2013-12-09 ENCOUNTER — Other Ambulatory Visit: Payer: Self-pay

## 2013-12-09 DIAGNOSIS — Z1231 Encounter for screening mammogram for malignant neoplasm of breast: Secondary | ICD-10-CM

## 2013-12-14 ENCOUNTER — Ambulatory Visit (INDEPENDENT_AMBULATORY_CARE_PROVIDER_SITE_OTHER): Payer: 59 | Admitting: Gynecology

## 2013-12-14 ENCOUNTER — Telehealth: Payer: Self-pay | Admitting: Obstetrics & Gynecology

## 2013-12-14 ENCOUNTER — Telehealth: Payer: Self-pay | Admitting: *Deleted

## 2013-12-14 ENCOUNTER — Encounter: Payer: Self-pay | Admitting: Gynecology

## 2013-12-14 VITALS — BP 122/70 | HR 68 | Temp 98.4°F | Resp 18 | Ht 66.75 in | Wt 211.0 lb

## 2013-12-14 DIAGNOSIS — N950 Postmenopausal bleeding: Secondary | ICD-10-CM

## 2013-12-14 LAB — CBC
HCT: 40.2 % (ref 36.0–46.0)
HEMOGLOBIN: 14.2 g/dL (ref 12.0–15.0)
MCH: 31 pg (ref 26.0–34.0)
MCHC: 35.3 g/dL (ref 30.0–36.0)
MCV: 87.8 fL (ref 78.0–100.0)
PLATELETS: 355 10*3/uL (ref 150–400)
RBC: 4.58 MIL/uL (ref 3.87–5.11)
RDW: 13.5 % (ref 11.5–15.5)
WBC: 8.2 10*3/uL (ref 4.0–10.5)

## 2013-12-14 MED ORDER — NORETHINDRONE ACETATE 5 MG PO TABS: ORAL_TABLET | ORAL | Status: DC

## 2013-12-14 NOTE — Progress Notes (Signed)
Pt called to report severe menorrhagia.  Current bleeding started 9/23 and then got heavy 12/10/13.  Pt states bleeding through super tampons every 2-3h with clots and cramping.  Prior cycle was 3/15, heavy but not this heavy.  Pt had an elevated [place] 4/15-69.2, E2 15.7.  Pt had labs based on irratic cycles but never went 1y without cycle.  No hormones, +hot flashes, uses black cohash. Pt has BTL. Pt denies fatigue, SOB, nausea, fever or chills  BP 122/70  Pulse 68  Temp(Src) 98.4 F (36.9 C) (Oral)  Resp 18  Ht 5' 6.75" (1.695 m)  Wt 211 lb (95.709 kg)  BMI 33.31 kg/m2  LMP 12/10/2013 General appearance: alert, cooperative and appears stated age Pelvic: external genitalia normal, no adnexal masses or tenderness, no cervical motion tenderness, uterus normal size, shape, and consistency and cervix with clot, vagina with moderate dark menstrum Neurologic: Grossly normal   Recommend EMB, pt agrees and consents.  Endometrial Biopsy Procedure Note     Procedure Details   The risks (including infection, bleeding, pain, and uterine perforation) and benefits of the procedure were explained to the patient and written informed consent was obtained.    The patient was placed in the dorsal lithotomy position.  Bimanual exam was performed to assess uterine size and position.  A  speculum inserted in the vagina, and the cervix prepped with povidone iodine.  Xylocaine jelly placed anterior lip and endocervix.  A sharp tenaculum  was  applied to the cervix.  Dilation of the cervix was not  necessary.    A pipelle was used to sample the endometrium.  The uterus sounded to  7 cm.   Moderate amount of tissue and blood on 2 passes. Sample was sent for pathologic examination.  Condition: Stable  Complications: None  Plan:  1. Postmenopausal bleeding  - Tissue Biopsy - CBC Aygestin-Take 3 tablets at once daily until bleeding stops then 2 tablets for 5d and once a day for a week.   The patient  was advised to call for any fever or for prolonged or severe pain or bleeding. She was advised to use OTC pain relievers as needed for mild to moderate pain. She was advised to avoid vaginal intercourse for 48 hours or until the bleeding has completely stopped.  An after visit summary was provided to the patient.

## 2013-12-14 NOTE — Patient Instructions (Addendum)
Endometrial Biopsy Post- Procedure Instructions  1. You may take Ibuprofen, Aleve or Tylenol for pain if needed.     Cramping should resolve within 24 hours.  2.  You may have a small amount of spotting. You should wear a mini pad for the next few days.  3. You may have intercourse after 24 hours.  4. You need to call if you have any pelvic pain, fever, heavy bleeding or foul smelling     vaginal discharge.  5. Shower or bathe as normal.  6. We will call you within one week with results or we will discuss the results at your follow-up appointment if needed.   Take 3 tablets at once daily until bleeding stops then 2 tablets for 5d and once a day for a week

## 2013-12-14 NOTE — Telephone Encounter (Signed)
Pt says she has started bleeding heavily since Friday.

## 2013-12-14 NOTE — Telephone Encounter (Signed)
Fax from [facility] wanting to clarify rx that was sent today for patient's Aygestin.  They needed to clarify frequency according to Dr. Brion Aliment note   Patient is to Take 3 tablets at once daily until bleeding stops then 2 tablets for 5d and once a day for a week.  S/w associate Monica and clarified rx.  Routed to provider for review, encounter closed.

## 2013-12-14 NOTE — Telephone Encounter (Signed)
Angela Wells is a 43 y.o. female C/o post menopausal bleeding. Started last week with spotting and cramping. Over the weekend, bleeding increased with heavy cramps and some clotting. Patient reports that she is changing her super tampon q 2 hours. Feels unwell, but denies dizziness, fatigue, sob.   Spoke with Dr. Charlies Constable, ask patient to come now. Patient coming now.   Not on HRT. Hx BTL.  Last [facility] in 06/2013, 69.2   Ordered endometrial biopsy for precert.

## 2013-12-16 LAB — IPS OTHER TISSUE BIOPSY

## 2013-12-17 ENCOUNTER — Telehealth: Payer: Self-pay

## 2013-12-17 NOTE — Telephone Encounter (Signed)
Message copied by Jasmine Awe on Fri Dec 17, 2013 11:22 AM ------      Message from: Angela Wells      Created: Fri Dec 17, 2013  8:25 AM       INFORM BX IS BENIGN, PLS ASK HOW HER BLEEDING IS ------

## 2013-12-17 NOTE — Telephone Encounter (Signed)
Spoke with patient. Advised of message as seen below from Dr.Lathrop. Patient is agreeable and verbalizes understanding. Patient states that her bleeding has now stopped.  Routing to provider for final review. Patient agreeable to disposition. Will close encounter

## 2013-12-22 ENCOUNTER — Ambulatory Visit
Admission: RE | Admit: 2013-12-22 | Discharge: 2013-12-22 | Disposition: A | Discharge status: Home / Self Care | Payer: 59 | Source: Ambulatory Visit

## 2013-12-22 DIAGNOSIS — Z1231 Encounter for screening mammogram for malignant neoplasm of breast: Secondary | ICD-10-CM

## 2013-12-24 ENCOUNTER — Encounter: Payer: Self-pay | Admitting: Gynecology

## 2014-01-17 ENCOUNTER — Encounter: Payer: Self-pay | Admitting: Gynecology

## 2014-07-22 ENCOUNTER — Ambulatory Visit: Payer: 59 | Admitting: Obstetrics & Gynecology

## 2014-07-29 ENCOUNTER — Ambulatory Visit: Payer: 59 | Admitting: Obstetrics & Gynecology

## 2014-10-18 ENCOUNTER — Other Ambulatory Visit: Payer: Self-pay | Admitting: Obstetrics & Gynecology

## 2014-10-18 NOTE — Telephone Encounter (Signed)
Medication refill request: Celexa 20 mg Last AEX:  07/08/13 with SM Next AEX: 11/10/14 with SM  Last MMG (if hormonal medication request): n/a Refill authorized: #[id number]

## 2014-10-19 ENCOUNTER — Other Ambulatory Visit: Payer: Self-pay | Admitting: Obstetrics & Gynecology

## 2014-10-19 NOTE — Telephone Encounter (Signed)
Medication refill request: Celexa 20 Last AEX:  07/08/13 SM Next AEX: 11/10/14 SM Last MMG (if hormonal medication request):  Refill authorized: please advise

## 2014-11-08 ENCOUNTER — Other Ambulatory Visit: Payer: Self-pay

## 2014-11-08 DIAGNOSIS — Z1231 Encounter for screening mammogram for malignant neoplasm of breast: Secondary | ICD-10-CM

## 2014-11-10 ENCOUNTER — Ambulatory Visit (INDEPENDENT_AMBULATORY_CARE_PROVIDER_SITE_OTHER): Payer: 59 | Admitting: Obstetrics & Gynecology

## 2014-11-10 ENCOUNTER — Encounter: Payer: Self-pay | Admitting: Obstetrics & Gynecology

## 2014-11-10 VITALS — BP 108/64 | HR 56 | Resp 12 | Ht 66.75 in | Wt 173.0 lb

## 2014-11-10 DIAGNOSIS — N938 Other specified abnormal uterine and vaginal bleeding: Secondary | ICD-10-CM | POA: Diagnosis not present

## 2014-11-10 DIAGNOSIS — Z01419 Encounter for gynecological examination (general) (routine) without abnormal findings: Secondary | ICD-10-CM

## 2014-11-10 DIAGNOSIS — E7800 Pure hypercholesterolemia, unspecified: Secondary | ICD-10-CM

## 2014-11-10 DIAGNOSIS — M1990 Unspecified osteoarthritis, unspecified site: Secondary | ICD-10-CM

## 2014-11-10 DIAGNOSIS — N950 Postmenopausal bleeding: Secondary | ICD-10-CM

## 2014-11-10 DIAGNOSIS — Z0000 Encounter for general adult medical examination without abnormal findings: Secondary | ICD-10-CM

## 2014-11-10 DIAGNOSIS — Z124 Encounter for screening for malignant neoplasm of cervix: Secondary | ICD-10-CM | POA: Diagnosis not present

## 2014-11-10 LAB — POCT URINALYSIS DIPSTICK
Bilirubin, UA: NEGATIVE
Blood, UA: NEGATIVE
Glucose, UA: NEGATIVE
KETONES UA: NEGATIVE
Nitrite, UA: NEGATIVE
PH UA: 5
PROTEIN UA: NEGATIVE
UROBILINOGEN UA: NEGATIVE

## 2014-11-10 LAB — HEMOGLOBIN, FINGERSTICK: Hemoglobin, fingerstick: 13.5 g/dL (ref 12.0–16.0)

## 2014-11-10 MED ORDER — CITALOPRAM HYDROBROMIDE 20 MG PO TABS: 20.0000 mg | ORAL_TABLET | Freq: Every day | ORAL | Status: DC

## 2014-11-10 NOTE — Addendum Note (Signed)
Addended by: [name] on: 11/10/2014 11:30 AM   Modules accepted: Orders, SmartSet

## 2014-11-10 NOTE — Progress Notes (Addendum)
44 y.o. G4P2 MarriedCaucasianF here for annual exam.  Pt having a little bit of irregular bleeding.  Had a biopsy 12/14/13 after having an episode of heavy bleeding that was proceeded by six months of amenorrhea.    Down 40 pounds form last year.  Has watched diet and exercising three to four times a week.    PCP:  Janie Morning.  [facility].  Seen recently due to arthritis in thumbs.   Patient's last menstrual period was 10/18/2014.          Sexually active: Yes.    The current method of family planning is tubal ligation.    Exercising: Yes.     Smoker:  Former smoker  Health Maintenance: Pap:  07/08/13 WNL, 02/25/11 WNL/negative HR HPV History of abnormal Pap:  Yes h/o cryo age 72 MMG:  12/22/13 3D BiRads 1-negative Colonoscopy:  none BMD:   none TDaP:  4/10 Screening Labs: will do at work, Hb today: 13.5, Urine today: WBC-trace   reports that she has quit smoking. She has never used smokeless tobacco. She reports that she drinks alcohol. She reports that she does not use illicit drugs.  Past Medical History  Diagnosis Date  . Anxiety   . Abnormal Pap smear   . Eczema   . Hypertension     on meds, ? secondary to [facility]    Past Surgical History  Procedure Laterality Date  . Tubal ligation  03/04/2011    Procedure: ESSURE TUBAL STERILIZATION;  Surgeon: Felipa Emory;  Location: [facility];  Service: Gynecology;  Laterality: Bilateral;  Attempted  . Laparoscopic tubal ligation  03/04/2011    Procedure: LAPAROSCOPIC TUBAL LIGATION;  Surgeon: Felipa Emory;  Location: [facility];  Service: Gynecology;  Laterality: Bilateral;  with filshie clips  . Gynecologic cryosurgery    . Iud removal  6/12    in cx    Current Outpatient Prescriptions  Medication Sig Dispense Refill  . BL EVENING PRIMROSE OIL PO Take by mouth daily.    Marland Kitchen BLACK COHOSH PO Take by mouth.    . citalopram (CELEXA) 20 MG tablet TAKE 1 TABLET BY MOUTH AT BEDTIME. 90 tablet 0  . IRON PO Take 40 mg by  mouth.    . Multiple Vitamins-Minerals (MULTIVITAMINS THER. W/MINERALS) TABS Take 1 tablet by mouth daily.      . Pyridoxine HCl (VITAMIN B-6 PO) Take by mouth.    . vitamin B-12 (CYANOCOBALAMIN) 100 MCG tablet Take 100 mcg by mouth daily.     No current facility-administered medications for this visit.    Family History  Problem Relation Age of Onset  . Hypertension Father   . Thyroid disease Mother     ROS:  Pertinent items are noted in HPI.  Otherwise, a comprehensive ROS was negative.  Exam:   LMP 10/18/2014  Weight change: -41#      Ht Readings from Last 3 Encounters:  12/14/13 5' 6.75" (1.695 m)  07/08/13 5' 6.75" (1.695 m)  12/24/12 5' 6.75" (1.695 m)   General appearance: alert, cooperative and appears stated age Head: Normocephalic, without obvious abnormality, atraumatic Neck: no adenopathy, supple, symmetrical, trachea midline and thyroid normal to inspection and palpation Lungs: clear to auscultation bilaterally Breasts: normal appearance, no masses or tenderness Heart: regular rate and rhythm Abdomen: soft, non-tender; bowel sounds normal; no masses,  no organomegaly Extremities: extremities normal, atraumatic, no cyanosis or edema Skin: Skin color, texture, turgor normal. No rashes or lesions Lymph nodes: Cervical,  supraclavicular, and axillary nodes normal. No abnormal inguinal nodes palpated Neurologic: Grossly normal   Pelvic: External genitalia:  no lesions              Urethra:  normal appearing urethra with no masses, tenderness or lesions              Bartholins and Skenes: normal                 Vagina: normal appearing vagina with normal color and discharge, no lesions              Cervix: no lesions              Pap taken: Yes.   Bimanual Exam:  Uterus:  normal size, contour, position, consistency, mobility, non-tender              Adnexa: normal adnexa and no mass, fullness, tenderness               Rectovaginal: Confirms               Anus:   normal sphincter tone, no lesions  Chaperone was present for exam.  A:  Well Woman with normal exam H/O BTL H/O Eczema Hypertension DUB  P: Mammogram yearly. D/W 3D due to breast density pap smear only today. Neg HR HPV 2012. [place] today.  CMP and lipids today. Rx for Celexa 20mg  daily to pharmacy. #90/4 RF. return annually or prn

## 2014-11-11 LAB — COMPREHENSIVE METABOLIC PANEL
ALK PHOS: 43 U/L (ref 33–115)
ALT: 22 U/L (ref 6–29)
AST: 22 U/L (ref 10–35)
Albumin: 4.3 g/dL (ref 3.6–5.1)
BILIRUBIN TOTAL: 0.3 mg/dL (ref 0.2–1.2)
BUN: 22 mg/dL (ref 7–25)
CALCIUM: 9.6 mg/dL (ref 8.6–10.2)
CO2: 27 mmol/L (ref 20–31)
Chloride: 104 mmol/L (ref 98–110)
Creat: 0.84 mg/dL (ref 0.50–1.10)
Glucose, Bld: 89 mg/dL (ref 65–99)
POTASSIUM: 4.1 mmol/L (ref 3.5–5.3)
Sodium: 141 mmol/L (ref 135–146)
TOTAL PROTEIN: 7.1 g/dL (ref 6.1–8.1)

## 2014-11-11 LAB — LIPID PANEL
CHOL/HDL RATIO: 3.6 ratio (ref <=5.0)
CHOLESTEROL: 249 mg/dL — AB (ref 125–200)
HDL: 69 mg/dL (ref >=46)
LDL Cholesterol: 155 mg/dL — ABNORMAL HIGH (ref <130)
Triglycerides: 127 mg/dL (ref <150)
VLDL: 25 mg/dL (ref <30)

## 2014-11-11 LAB — FOLLICLE STIMULATING HORMONE: FSH: 89.7 m[IU]/mL

## 2014-11-11 NOTE — Addendum Note (Signed)
Addended by: [name] on: 11/11/2014 05:13 AM   Modules accepted: Orders, SmartSet

## 2014-11-14 ENCOUNTER — Telehealth: Payer: Self-pay | Admitting: Emergency Medicine

## 2014-11-14 LAB — IPS PAP TEST WITH REFLEX TO HPV

## 2014-11-14 NOTE — Telephone Encounter (Signed)
Call to patient she is given message from Dr. Sabra Heck. Patient will have lipid panel recheck, she has prescription from Dr. Sabra Heck.  She is agreeable to Pelvic ultrasound and is scheduled for 11/24/14 at 1600 with Dr. Sabra Heck. Advised patient will be contacted with benefits information and patient agreeable.  Routing to provider for final review. Patient agreeable to disposition. Will close encounter.  cc Kerry Hough

## 2014-11-14 NOTE — Telephone Encounter (Signed)
-----   Message from [name], MD sent at 11/11/2014  5:11 AM EDT ----- Inform pt that her CMP was normal.  Her lipids are elevated but it wasn't a fasting test.  Total cholesterol was 249 and LDL was 155.  HDLs are great.  Triglycerides are normal.  I think she should repeat fasting.  Can do here or at her work.  She doesn't need to have the CMP repeated.  Her [place] is clearly in menopausal range.  She has had some irregular spotting this year.  Should return for PUS.  Order is placed for this and lipids if she decides to do this in our office.  Can do same day--have lab in AM and return for PUS as well.

## 2014-11-23 ENCOUNTER — Telehealth: Payer: Self-pay | Admitting: Obstetrics & Gynecology

## 2014-11-23 NOTE — Telephone Encounter (Signed)
Spoke with patient. Reviewed benefit for procedure. Patient understood and agreeable. Verified arrival date/time for procedure. Patient agreeable. Ok to close. °

## 2014-11-23 NOTE — Telephone Encounter (Signed)
Called patient to review benefits for procedure. Left voicemail to call back and review. °

## 2014-11-24 ENCOUNTER — Ambulatory Visit (INDEPENDENT_AMBULATORY_CARE_PROVIDER_SITE_OTHER): Payer: 59

## 2014-11-24 ENCOUNTER — Ambulatory Visit (INDEPENDENT_AMBULATORY_CARE_PROVIDER_SITE_OTHER): Payer: 59 | Admitting: Obstetrics & Gynecology

## 2014-11-24 VITALS — BP 104/68 | HR 60 | Resp 12

## 2014-11-24 DIAGNOSIS — N938 Other specified abnormal uterine and vaginal bleeding: Secondary | ICD-10-CM | POA: Diagnosis not present

## 2014-11-24 DIAGNOSIS — N950 Postmenopausal bleeding: Secondary | ICD-10-CM

## 2014-11-24 NOTE — Progress Notes (Addendum)
44 y.o. Angela Wells here for a pelvic ultrasound due to perimenopausal DUB.  Pt reported irregular but light bleeding with AEX 11/10/14.  [place] was 89 that day.  Previous [facility] 07/08/13 was 69.  Pt has gone with extended episode of no bleeding for almost six months in 2015 that was then followed by heavy cycle.  Endometria biopsy was done showing: ENDOMETRIUM, BIOPSY: -ABUNDANT FRAGMENTED ENDOMETRIUM CONSISTING OF FRAGMENTS OF ENDOMETRIAL GLANDS WITH INACTIVE APPEARANCE, ENDOMETRIAL GLANDULAR EPITHELIUM AND PORTIONS OF COLLAPSED STROMA. -NO INTACT ENDOMETRIUM IDENTIFIED. -NEGATIVE FOR MALIGNANCY OR ATYPIA.  Pt reports today that she doesn't think she described her cycles very well to me when here for her AEX.  She reports the bleeding is very infrequent and is very light and very short.  Denies any clots.  Is having more vasomotor symptoms over the last three to four weeks as well.  Using black cohosh and evening of primrose oil.  Asks is this could contribute to the bleeding but I am doubtful.    Patient's last menstrual period was 10/18/2014.  Sexually active:  yes  Contraception: bilateral tubal ligation  FINDINGS: UTERUS: 6.0 x 3.9 x 3.5cm EMS: 3.15mm- 4.67mm  ADNEXA:   Left ovary 1.6 x 1.1 x 0.8cm with 76mm involuted follicle   Right ovary 1.9 x 1.5 x 1.3cm CUL DE SAC: no free fluid  Findings reviewed with pt.  Endometrium thin.  Risk for endometrial cancer low based on age and significant weight loss during pas year as well.  Feel with utrasound findings and description of bleeding provided today, can wait to proceed with endometrial biopsy.  Signs and symptoms reviewed for reasons for biopsy including heavier bleeding, clots, or more frequent (even if light) bleeding.  Pt states will monitor and call with any changes.  Assessment:  Perimenopausal DUB Plan: Pt will call with changes, otherwise, she will plan to follow up with AEX.  ~15 minutes spent with patient >50% of time was in face to  face discussion of above.

## 2014-11-27 ENCOUNTER — Encounter: Payer: Self-pay | Admitting: Obstetrics & Gynecology

## 2014-12-12 ENCOUNTER — Encounter: Payer: Self-pay | Admitting: Obstetrics & Gynecology

## 2014-12-15 ENCOUNTER — Telehealth: Payer: Self-pay

## 2014-12-15 ENCOUNTER — Encounter: Payer: Self-pay | Admitting: Obstetrics and Gynecology

## 2014-12-15 NOTE — Telephone Encounter (Signed)
Spoke with patient. Advised we have received her lipid panel results from [facility] lab. Dr.Silva has reviewed as Dr.Miller is out of the office this week. Advised cholesterol levels are still elevated with essentially no change from 1 month ago. Dr.silva recommends a low cholesterol diet with increased exercise and repeat levels in one year. Patient states she is concerned because she has been working out 4 days a week and has lost 41 pounds since January. States elevated cholesterol runs in her family. Advised with additional concerns would be best to see PCP for follow up of cholesterol levels and management. Patient is agreeable.  Routing to provider for final review. Patient agreeable to disposition. Will close encounter.

## 2014-12-26 ENCOUNTER — Ambulatory Visit: Payer: 59

## 2015-01-16 ENCOUNTER — Encounter: Payer: Self-pay | Admitting: Obstetrics & Gynecology

## 2015-01-16 ENCOUNTER — Telehealth: Payer: Self-pay | Admitting: Emergency Medicine

## 2015-01-16 NOTE — Telephone Encounter (Signed)
Chief Complaint  Patient presents with  . Advice Only    Patient sent mychart message     ===View-only below this line===   ----- Message -----    From: [name]    Sent: 01/16/2015 10:23 AM EDT      To: Lyman Speller, MD Subject: Non-Urgent Medical Question  Dr Sabra Heck is aware of my new workout schedule and I seem to have developed ringworm in my groin that I have treated with otc cream and it has not helped.  It is roundish,red,scaley and very itchy.    Is there anyway she would be willing to call me in something? thanks so much Nayda

## 2015-01-16 NOTE — Telephone Encounter (Signed)
Spoke with patient. She states she has two areas one on left groin and one on left labia that have been itching and red. Patient states she has used OTC antifungal cream without relief of symptoms. Increased working out and states that area in groin remains moist. Advised office visit indicted for evaluation. Patient agreeable. Scheduled office visit with Dr. Sabra Heck for 01/19/15 at 1045 (date due to patient preference with work schedule).   Routing to provider for final review. Patient agreeable to disposition. Will close encounter.

## 2015-01-16 NOTE — Telephone Encounter (Signed)
Telephone call for triage created to discuss message with patient and disposition as appropriate.   

## 2015-01-19 ENCOUNTER — Ambulatory Visit: Payer: 59

## 2015-01-19 ENCOUNTER — Ambulatory Visit (INDEPENDENT_AMBULATORY_CARE_PROVIDER_SITE_OTHER): Payer: 59 | Admitting: Obstetrics & Gynecology

## 2015-01-19 VITALS — BP 118/80 | HR 56 | Resp 16 | Wt 173.0 lb

## 2015-01-19 DIAGNOSIS — B354 Tinea corporis: Secondary | ICD-10-CM | POA: Diagnosis not present

## 2015-01-19 MED ORDER — FLUCONAZOLE 200 MG PO TABS: ORAL_TABLET | ORAL | Status: DC

## 2015-01-19 MED ORDER — NYSTATIN 100000 UNIT/GM EX CREA: 1.0000 "application " | TOPICAL_CREAM | Freq: Two times a day (BID) | CUTANEOUS | Status: DC

## 2015-01-19 NOTE — Progress Notes (Signed)
Subjective:     Patient ID: Angela Wells, female   DOB: January 02, 1971, 44 y.o.   MRN: [id number]  HPI Very nice 44 yo G4P2 MWF here for complaint of left thigh rash and vulvar itching.  Pt has been working out a lot more this year and has lost weight.  Doing a lot of spinning and really trying to continue with weight loss.  Reports the area on inner thigh has been present for several months.  She thinks it is ring work and has tried a host of OTC products.  She tried jock itch cream, A&D ointment, hydrocortisone cream, vinegar.  She's changes her soap and she's changed her detergent.  Nothing seems to make it go away.  Would like some suggestions.  Denies vaginal bleeding or vaginal discharge.  Also denies vaginal itching or odor.  No urinary symptoms.    Review of Systems  All other systems reviewed and are negative.      Objective:   Physical Exam  Constitutional: She is oriented to person, place, and time. She appears well-developed and well-nourished.  Genitourinary:    There is no rash, tenderness, lesion or injury on the right labia. There is no rash, tenderness, lesion or injury on the left labia.  Neurological: She is alert and oriented to person, place, and time.  Skin: Skin is warm and dry.     Psychiatric: She has a normal mood and affect.       Assessment:     Tinea corporis on left inner thigh     Plan:     Fluconazole 200mg  every three days for three doses Nystatin cream BID x 7 days Pt to call if does not resolve, would treat with fluconazole for longer if improved but not gone.  If this doesn't resolve skin issues, would then biopsy or refer.

## 2015-01-22 ENCOUNTER — Encounter: Payer: Self-pay | Admitting: Obstetrics & Gynecology

## 2015-02-17 ENCOUNTER — Ambulatory Visit: Payer: 59

## 2015-03-17 ENCOUNTER — Ambulatory Visit
Admission: RE | Admit: 2015-03-17 | Discharge: 2015-03-17 | Disposition: A | Discharge status: Home / Self Care | Payer: 59 | Source: Ambulatory Visit

## 2015-03-17 DIAGNOSIS — Z1231 Encounter for screening mammogram for malignant neoplasm of breast: Secondary | ICD-10-CM

## 2015-04-08 MED FILL — CITALOPRAM HBR 20 MG TABLET: 20 | 90 days supply | Qty: 90 | Fill #1

## 2015-04-08 MED FILL — FLUCONAZOLE 200 MG TABLET: 200 | 3 days supply | Qty: 3 | Fill #1

## 2015-04-10 ENCOUNTER — Encounter: Payer: Self-pay | Admitting: Obstetrics & Gynecology

## 2015-04-11 ENCOUNTER — Telehealth: Payer: Self-pay | Admitting: Emergency Medicine

## 2015-04-11 NOTE — Telephone Encounter (Signed)
Patient is returning a call to Tracy. °

## 2015-04-11 NOTE — Telephone Encounter (Signed)
Message left to return call to [name] at [phone].    

## 2015-04-11 NOTE — Telephone Encounter (Signed)
Telephone call for triage created to discuss message with patient and disposition as appropriate.   

## 2015-04-11 NOTE — Telephone Encounter (Signed)
Spoke with patient. Reviewed mychart message with her. She states she is not having heavy bleeding at this time but has concerns about irregular cycles continuing. Advised office visit recommended at this time. . She is advised that any bleeding will need to be evaluated with by Dr. Sabra Heck. Patient agreeable to office visit to discuss with Dr. Sabra Heck.  Has a vacation coming up and does not want to have any heavy bleeding during this time.   Office visit with Dr. Sabra Heck scheduled for 04/13/15 at 0900.  Advised patient to call back with any increase in symptoms or any concerns. Routing to provider for final review. Patient agreeable to disposition. Will close encounter.

## 2015-04-11 NOTE — Telephone Encounter (Signed)
Called patient and Message left to return call to [place] at [phone] on mobile.   Sent mychart message as well.   Last FSH 89.7 on 11/10/14. Will need office visit with Dr. Sabra Heck.

## 2015-04-11 NOTE — Telephone Encounter (Signed)
Chief Complaint  Patient presents with  . Advice Only    Patient sent mychart message   . Appointment    ===View-only below this line===   ----- Message -----    From: [name]    Sent: 04/10/2015  2:22 PM EST      To: Lyman Speller, MD Subject: Non-Urgent Medical Question  Hello Dr. Addison Bailey the last time I was in your office I am now on the beginning of my third cycle.  The first was 12/8,second was 12/29 and another today.  I have very tender nipples for about a week-two weeks then have a light then to lots of clotting for about 4 days.  Is this just to a new trend for me for a while? Thanks so much for your help, Angela Wells [id number]

## 2015-04-13 ENCOUNTER — Encounter: Payer: Self-pay | Admitting: Obstetrics & Gynecology

## 2015-04-13 ENCOUNTER — Ambulatory Visit (INDEPENDENT_AMBULATORY_CARE_PROVIDER_SITE_OTHER): Payer: 59 | Admitting: Obstetrics & Gynecology

## 2015-04-13 VITALS — BP 120/74 | HR 56 | Resp 18 | Ht 66.75 in | Wt 173.0 lb

## 2015-04-13 DIAGNOSIS — N840 Polyp of corpus uteri: Secondary | ICD-10-CM | POA: Diagnosis not present

## 2015-04-13 DIAGNOSIS — N950 Postmenopausal bleeding: Secondary | ICD-10-CM

## 2015-04-13 LAB — FOLLICLE STIMULATING HORMONE: FSH: 12.9 m[IU]/mL

## 2015-04-13 MED ORDER — NORETHINDRONE 0.35 MG PO TABS: 1.0000 | ORAL_TABLET | Freq: Every day | ORAL | Status: DC

## 2015-04-13 MED FILL — NORETHINDRONE 0.35 MG TAB: 0.35 | 84 days supply | Qty: 84 | Fill #0

## 2015-04-13 NOTE — Progress Notes (Signed)
Subjective:     Patient ID: Angela Wells, female   DOB: May 24, 1970, 45 y.o.   MRN: [id number]  HPI 44 yo G2P2 here for complaint of bleeding in December and then again in January.  Pt had an [place] in 8/16 of 89 and in 4/15 of 69.  Actually today, she's started spotting so this would be the third bleeding episode if it turns into anything more significant.  Feels moody for several days around the bleeding episodes.  Only feels like she's having two good weeks each month.    Last Pap smear was 8/16.  This was normal.  She did have an endometrial biopsy on 12/14/13.    Pt is going on a cruise Feb 18- Feb 23.  Is transitioning to a new job at the [employer].  Early march would be good for her.    Review of Systems  Reason unable to perform ROS: increased moodiness.  All other systems reviewed and are negative.      Objective:   Physical Exam  Constitutional: She is oriented to person, place, and time. She appears well-developed and well-nourished.  Abdominal: Soft. Bowel sounds are normal.  Genitourinary: Vagina normal and uterus normal. There is no rash, tenderness, lesion or injury on the right labia. There is no rash, tenderness, lesion or injury on the left labia. Cervix exhibits no motion tenderness, no discharge and no friability. Right adnexum displays no mass, no tenderness and no fullness. Left adnexum displays no mass, no tenderness and no fullness.  Lymphadenopathy:       Right: No inguinal adenopathy present.       Left: No inguinal adenopathy present.  Neurological: She is alert and oriented to person, place, and time.  Skin: Skin is warm and dry.  Psychiatric: She has a normal mood and affect.   Endometrial biopsy recommended.  Discussed with patient.  Verbal and written consent obtained.   Procedure:  Speculum placed.  Cervix visualized and cleansed with betadine prep.  A single toothed tenaculum was not applied to the anterior lip of the cervix.  Endometrial  pipelle was advanced through the cervix into the endometrial cavity without difficulty.  Pipelle passed to 8cm.  Suction applied and pipelle removed with good tissue sample obtained.  Tenculum removed.  No bleeding noted.  Patient tolerated procedure well.     Assessment:     PMP bleeding in pt with FSH of 89 last fall      Plan:     Repeat [facility] today Endometrial biopsy results pending.  If biopsy is normal, she would like to consider an endometrial ablation in the office.  Start micronor now as she does not want to be bleeding when she goes on her cruise

## 2015-04-19 ENCOUNTER — Telehealth: Payer: Self-pay | Admitting: Emergency Medicine

## 2015-04-19 NOTE — Telephone Encounter (Signed)
Message left to return call to Billal Rollo at [phone].    

## 2015-04-19 NOTE — Telephone Encounter (Signed)
-----   Message from [name], MD sent at 04/16/2015  9:26 AM EST ----- Please call pt.  She was sent a message throught myChart that her pathology was negative.  Pt interested in endometrial ablation but I think this needs to be done in the OR due to polyp on pathology report.  She would need hysteroscopy with possible polyp resection, then endometrial ablation.  Pt has had a BTL.  CC:  Emelia Salisbury

## 2015-04-19 NOTE — Telephone Encounter (Signed)
Patient returned call.  She is thinking about completing procedure in OR but concerned about potential costs. Has transferred positions at [employer] so will need to wait until at least early part of March to plan procedure.   She is advised that Scientist, clinical (histocompatibility and immunogenetics) will contact her with benefits information and she can let us know what her plans are.  Patient agreeable. She will wait for call regarding coverage. Will call back with any concerns prior.   Routing to provider for final review. Patient agreeable to disposition. Will close encounter.

## 2015-04-20 NOTE — Telephone Encounter (Signed)
Sending to Gay Filler to know pt may need surgery scheduling.

## 2015-04-21 ENCOUNTER — Telehealth: Payer: Self-pay | Admitting: Obstetrics & Gynecology

## 2015-04-21 NOTE — Telephone Encounter (Signed)
Called patient to discuss benefits for a procedure. Left Voicemail requesting a call back. °

## 2015-04-25 NOTE — Telephone Encounter (Signed)
Called patient to discuss benefits for a procedure. Left Voicemail requesting a call back. °

## 2015-05-03 NOTE — Telephone Encounter (Signed)
Called patient to discuss benefits for a recommended procedure. Left Voicemail requesting a call back.

## 2015-05-09 NOTE — Telephone Encounter (Signed)
Called patient to review benefits for a recommended procedure. Left Voicemail requesting a call back. °

## 2015-05-18 ENCOUNTER — Telehealth: Payer: Self-pay | Admitting: Obstetrics & Gynecology

## 2015-05-18 NOTE — Telephone Encounter (Signed)
Patient asking to speak to [facility].

## 2015-05-18 NOTE — Telephone Encounter (Signed)
Spoke with pt regarding benefit for surgery. Patient understood and agreeable. Patient ready to schedule. Patient provided surgery deposit over the phone. Patient aware this is professional benefit only. Patient aware will be contacted by hospital for separate benefits. Staff message to Sally for scheduling °

## 2015-05-23 ENCOUNTER — Telehealth: Payer: Self-pay | Admitting: *Deleted

## 2015-05-23 NOTE — Telephone Encounter (Signed)
Patient returns call. Desires to proceed with procedure at end of March or first 3 weeks of April. Patient requesting Friday surgery date.  Advised will need to check on Friday availability and call her back.

## 2015-05-23 NOTE — Telephone Encounter (Signed)
Call to patient to schedule surgery. Left message to call back. 

## 2015-05-25 ENCOUNTER — Other Ambulatory Visit: Payer: Self-pay | Admitting: Obstetrics & Gynecology

## 2015-05-29 NOTE — Telephone Encounter (Signed)
Patient calling see if Gay Filler has a date for her.

## 2015-05-31 NOTE — Telephone Encounter (Signed)
Patient returned call. Informed of surgery date, time, location and patient agreeable. Surgery instruction sheet reviewed and printed copy will be mailed, see copy scanned to chart.  Patient last office visit 04-13-15. Surgery consult scheduled for 06-05-15.  Routing to provider for final review. Patient agreeable to disposition. Will close encounter.

## 2015-05-31 NOTE — Telephone Encounter (Signed)
Call to patient, left message to call back. Surgery is scheduled for Friday 06-16-15 at 0730 at [facility] as patient requested.

## 2015-06-05 ENCOUNTER — Encounter: Payer: Self-pay | Admitting: Obstetrics & Gynecology

## 2015-06-05 ENCOUNTER — Ambulatory Visit (INDEPENDENT_AMBULATORY_CARE_PROVIDER_SITE_OTHER): Payer: 59 | Admitting: Obstetrics & Gynecology

## 2015-06-05 VITALS — BP 118/66 | HR 56 | Resp 14 | Ht 66.75 in | Wt 174.0 lb

## 2015-06-05 DIAGNOSIS — N840 Polyp of corpus uteri: Secondary | ICD-10-CM

## 2015-06-05 DIAGNOSIS — L682 Localized hypertrichosis: Secondary | ICD-10-CM | POA: Diagnosis not present

## 2015-06-05 DIAGNOSIS — N920 Excessive and frequent menstruation with regular cycle: Secondary | ICD-10-CM | POA: Diagnosis not present

## 2015-06-05 DIAGNOSIS — L678 Other hair color and hair shaft abnormalities: Secondary | ICD-10-CM

## 2015-06-05 MED ORDER — EFLORNITHINE HCL 13.9 % EX CREA: TOPICAL_CREAM | CUTANEOUS | Status: DC

## 2015-06-05 MED ORDER — DIAZEPAM 10 MG PO TABS: 5.0000 mg | ORAL_TABLET | Freq: Three times a day (TID) | ORAL | Status: DC | PRN

## 2015-06-05 MED ORDER — HYDROCODONE-ACETAMINOPHEN 5-325 MG PO TABS: 1.0000 | ORAL_TABLET | Freq: Four times a day (QID) | ORAL | Status: DC | PRN

## 2015-06-05 MED ORDER — CITALOPRAM HYDROBROMIDE 20 MG PO TABS
20.0000 mg | ORAL_TABLET | Freq: Every day | ORAL | Status: DC
Start: 2015-06-05 — End: 2016-02-23

## 2015-06-05 NOTE — Progress Notes (Addendum)
45 y.o. G52P2 MarriedCaucasian female here for discussion of upcoming procedure.  DILATATION & CURETTAGE/HYSTEROSCOPY WITH MYOSURE and NOVASURE ABLATION is planned for 06/16/15.  Pt has been considering this for some time now.  She was recently on micornor but does not desire long term hormonal therapy.  After last visit, she called to go ahead and get procedure scheduled.  She wanted to be able to plan for work.  [place] was elevated last year on two different occasions but she's been working on weight loss over the last year or so and is down >40 pounds.  Since October, she's essentially had normal cycles.  They are heavy and last 5-7 days.  She does pass clots at times.  She is just sick of the amount of bleeding she's having.  She has tried an IUD in the past but expelled it and she has been on micronor in the past.  She is desirous of something more definitive.  Pt did have an endometrial biopsy on 04/13/15 and polyp was noted.  Hysteroscopy with polyp resection was recommended.  She had already been considering an endometrial ablation so she decided after that visit to proceed with all of this.  Since that was in January and she has postponed this due to personal needs, she is here for additional recommendations.    Ultrasound done 11/24/14 showed uterus 6 x 4 x 3.5cm.  Endometrium was 3.5-60mm and ovaries appeared normal.    Procedure discussed with patient.  Recovery and pain management discussed.  Risks discussed including but not limited to bleeding, rare risk of transfusion, infection, 1% risk of uterine perforation with risks of fluid deficit causing cardiac arrythmia, cerebral swelling and/or need to stop procedure early.  Fluid emboli and rare risk of death discussed.  Need to not proceed with endometrial ablation reviewed if perforation occurs.  DVT/PE, rare risk of risk of bowel/bladder/ureteral/vascular injury.  Patient aware if pathology abnormal she may need additional treatment.  She also knows she  should never be pregnant after ablation procedure.  She had BTL in the past.  Watery discharge and cramping after procedure reviewed as well.  All questions answered.    Lastly, and unrelated, pt reports she has some facial hair that she plucks and she's seen some ads for Vaniqa.  She asks my experience and results.  Advised I have other pt's who use this.  Advised to start with use on another part of body, not face, at first to ensure no reaction.  BID use advised.  Rx will be provided.  Ob Hx:   Patient's last menstrual period was 04/10/2015.          Sexually active: Yes.   Birth control: bilateral tubal ligation Last pap: 11/10/14 Neg Last MMG: 03/17/15 BIRADS1:neg Tobacco: Former  Past Surgical History  Procedure Laterality Date  . Tubal ligation  03/04/2011    Procedure: ESSURE TUBAL STERILIZATION;  Surgeon: Felipa Emory;  Location: [facility];  Service: Gynecology;  Laterality: Bilateral;  Attempted  . Laparoscopic tubal ligation  03/04/2011    Procedure: LAPAROSCOPIC TUBAL LIGATION;  Surgeon: Felipa Emory;  Location: [facility];  Service: Gynecology;  Laterality: Bilateral;  with filshie clips  . Gynecologic cryosurgery    . Iud removal  6/12    in cx    Past Medical History  Diagnosis Date  . Anxiety   . Abnormal Pap smear   . Eczema   . Hypertension     on meds, ? secondary to OCP  .  Dyslipidemia with elevated low density lipoprotein (LDL) cholesterol and abnormally low high density lipoprotein cholesterol     Allergies: Review of patient's allergies indicates no known allergies.  Current Outpatient Prescriptions  Medication Sig Dispense Refill  . BL EVENING PRIMROSE OIL PO Take by mouth daily. Reported on 06/05/2015    . BLACK COHOSH PO Take by mouth. Reported on 06/05/2015    . citalopram (CELEXA) 20 MG tablet Take 1 tablet (20 mg total) by mouth at bedtime. (Patient not taking: Reported on 06/05/2015) 90 tablet 4  . DHA-EPA-Vitamin E (OMEGA-3 COMPLEX) 192-251-11  MG-MG-UNIT CAPS Take 2 capsules by mouth 2 (two) times daily. Reported on 06/05/2015    . Maca Root 500 MG CAPS Take 1 capsule by mouth daily. Reported on 06/05/2015    . Multiple Vitamins-Minerals (MULTIVITAMINS THER. W/MINERALS) TABS Take 1 tablet by mouth daily. Reported on 06/05/2015    . NON FORMULARY Reported on 06/05/2015    . NON FORMULARY Reported on 06/05/2015    . nystatin cream (MYCOSTATIN) Apply 1 application topically 2 (two) times daily. Apply to affected area BID for up to 7 days. (Patient not taking: Reported on 06/05/2015) 30 g 1  . Pyridoxine HCl (VITAMIN B-6 PO) Take by mouth. Reported on 06/05/2015    . Turmeric 500 MG TABS Take 1 tablet by mouth daily. Reported on 06/05/2015    . vitamin B-12 (CYANOCOBALAMIN) 100 MCG tablet Take 100 mcg by mouth daily. Reported on 06/05/2015     No current facility-administered medications for this visit.    ROS: Pertinent items noted in HPI and remainder of comprehensive ROS otherwise negative.  Exam:    BP 118/66 mmHg  Pulse 56  Resp 14  Ht 5' 6.75" (1.695 m)  Wt 174 lb (78.926 kg)  BMI 27.47 kg/m2  LMP 04/10/2015  General appearance: alert and cooperative Head: Normocephalic, without obvious abnormality, atraumatic Neck: no adenopathy, supple, symmetrical, trachea midline and thyroid not enlarged, symmetric, no tenderness/mass/nodules Lungs: clear to auscultation bilaterally Heart: regular rate and rhythm, S1, S2 normal, no murmur, click, rub or gallop Abdomen: soft, non-tender; bowel sounds normal; no masses,  no organomegaly Extremities: extremities normal, atraumatic, no cyanosis or edema Skin: Skin color, texture, turgor normal. No rashes or lesions Lymph nodes: Cervical, supraclavicular, and axillary nodes normal. no inguinal nodes palpated Neurologic: Grossly normal  Pelvic: External genitalia:  no lesions              Urethra: normal appearing urethra with no masses, tenderness or lesions              Bartholins and  Skenes: Bartholin's, Urethra, Skene's normal                 Vagina: normal appearing vagina with normal color and discharge, no lesions              Cervix: normal appearance              Pap taken: No.        Bimanual Exam:  Uterus:  uterus is normal size, shape, consistency and nontender                                      Adnexa:    normal adnexa in size, nontender and no masses  Rectovaginal: Deferred                                      Anus:  normal sphincter tone, no lesions  A: Menorrhagia in pt who has failed Mirena IUD use and micronor use  H/O BTL 12/12 Endometrial polyp on pathology with endometrial biopsy 9/16   Facial hair  P:  Hysteroscopy with polyp resection, possible D&C and then endometrial ablation with Novasure planned Rx for Vicodin 5/325mg  po 1-2 every 6 hours prn given.  #0/0RF given.  Pt advised to also take motrin 800mg  every 8 hours.  She does not need rx.  Lastly, Valium 5mg  every 8 hours for unrelieved pain given.  #5/0RF Medications/Vitamins reviewed.  Pt knows there is nothing that she needs to stop at this time.  She is aware not to take aspirin products <5 days before procedure. Rx for vaniqu 13.9% cream to skin BID.    ~30 minutes spent with patient >50% of time was in face to face discussion of above.

## 2015-06-08 ENCOUNTER — Other Ambulatory Visit: Payer: Self-pay | Admitting: Obstetrics & Gynecology

## 2015-06-12 ENCOUNTER — Other Ambulatory Visit: Payer: Self-pay

## 2015-06-12 ENCOUNTER — Encounter (HOSPITAL_COMMUNITY)
Admission: RE | Admit: 2015-06-12 | Discharge: 2015-06-12 | Disposition: A | Discharge status: Home / Self Care | Payer: 59 | Source: Ambulatory Visit | Attending: Obstetrics & Gynecology | Admitting: Obstetrics & Gynecology

## 2015-06-12 ENCOUNTER — Encounter (HOSPITAL_COMMUNITY): Payer: Self-pay

## 2015-06-12 DIAGNOSIS — Z01810 Encounter for preprocedural cardiovascular examination: Secondary | ICD-10-CM | POA: Insufficient documentation

## 2015-06-12 DIAGNOSIS — Z01812 Encounter for preprocedural laboratory examination: Secondary | ICD-10-CM | POA: Insufficient documentation

## 2015-06-12 HISTORY — DX: Unspecified osteoarthritis, unspecified site: M19.90

## 2015-06-12 HISTORY — DX: Major depressive disorder, single episode, unspecified: F32.9

## 2015-06-12 HISTORY — DX: Depression, unspecified: F32.A

## 2015-06-12 LAB — BASIC METABOLIC PANEL
Anion gap: 9 (ref 5–15)
BUN: 16 mg/dL (ref 6–20)
CHLORIDE: 104 mmol/L (ref 101–111)
CO2: 27 mmol/L (ref 22–32)
Calcium: 9.6 mg/dL (ref 8.9–10.3)
Creatinine, Ser: 0.76 mg/dL (ref 0.44–1.00)
GFR calc Af Amer: >60 mL/min (ref >60)
GFR calc non Af Amer: >60 mL/min (ref >60)
Glucose, Bld: 135 mg/dL — ABNORMAL HIGH (ref 65–99)
POTASSIUM: 3.6 mmol/L (ref 3.5–5.1)
SODIUM: 140 mmol/L (ref 135–145)

## 2015-06-12 LAB — CBC
HCT: 37.9 % (ref 36.0–46.0)
Hemoglobin: 13 g/dL (ref 12.0–15.0)
MCH: 31.3 pg (ref 26.0–34.0)
MCHC: 34.3 g/dL (ref 30.0–36.0)
MCV: 91.1 fL (ref 78.0–100.0)
PLATELETS: 245 10*3/uL (ref 150–400)
RBC: 4.16 MIL/uL (ref 3.87–5.11)
RDW: 12.8 % (ref 11.5–15.5)
WBC: 7.8 10*3/uL (ref 4.0–10.5)

## 2015-06-12 NOTE — Patient Instructions (Addendum)
Your procedure is scheduled on: Friday, March 31  Enter through the [employer] [facility] at: [employer] up the phone at the desk and dial [phone].  Call this number if you have problems the morning of surgery: [phone].  Remember: Do NOT eat food: after midnight Thursday Do NOT drink clear liquids after midnight Thursday Take these medicines the morning of surgery with a SIP OF WATER:  None.  Do NOT wear jewelry (body piercing), metal hair clips/bobby pins, make-up, or nail polish. Do NOT wear lotions, powders, or perfumes.  You may wear deoderant. Do NOT shave for 48 hours prior to surgery. Do NOT bring valuables to the hospital.  Have a responsible adult drive you home and stay with you for 24 hours after your procedure.  Home with husband Jimmy cell [phone].

## 2015-06-15 MED FILL — HYDROCODON-APAP 5-325: 5-325 | 2 days supply | Qty: 20 | Fill #0

## 2015-06-15 MED FILL — diazePAM 10 MG TABS: 10 | 1 days supply | Qty: 5 | Fill #0

## 2015-06-16 ENCOUNTER — Encounter (HOSPITAL_COMMUNITY): Payer: Self-pay | Admitting: Anesthesiology

## 2015-06-16 ENCOUNTER — Ambulatory Visit (HOSPITAL_COMMUNITY)
Admission: RE | Admit: 2015-06-16 | Discharge: 2015-06-16 | Disposition: A | Discharge status: Home / Self Care | Payer: 59 | Source: Ambulatory Visit | Attending: Obstetrics & Gynecology | Admitting: Obstetrics & Gynecology

## 2015-06-16 ENCOUNTER — Ambulatory Visit (HOSPITAL_COMMUNITY): Payer: 59 | Admitting: Anesthesiology

## 2015-06-16 ENCOUNTER — Encounter (HOSPITAL_COMMUNITY)
Admission: RE | Disposition: A | Discharge status: Home / Self Care | Payer: Self-pay | Source: Ambulatory Visit | Attending: Obstetrics & Gynecology

## 2015-06-16 DIAGNOSIS — F418 Other specified anxiety disorders: Secondary | ICD-10-CM | POA: Insufficient documentation

## 2015-06-16 DIAGNOSIS — N938 Other specified abnormal uterine and vaginal bleeding: Secondary | ICD-10-CM | POA: Diagnosis not present

## 2015-06-16 DIAGNOSIS — N920 Excessive and frequent menstruation with regular cycle: Secondary | ICD-10-CM | POA: Insufficient documentation

## 2015-06-16 DIAGNOSIS — N840 Polyp of corpus uteri: Secondary | ICD-10-CM | POA: Diagnosis not present

## 2015-06-16 DIAGNOSIS — Z87891 Personal history of nicotine dependence: Secondary | ICD-10-CM | POA: Insufficient documentation

## 2015-06-16 HISTORY — PX: DILATATION & CURETTAGE/HYSTEROSCOPY WITH MYOSURE: SHX6511

## 2015-06-16 LAB — PREGNANCY, URINE: Preg Test, Ur: NEGATIVE

## 2015-06-16 SURGERY — DILATATION & CURETTAGE/HYSTEROSCOPY WITH MYOSURE
Anesthesia: General

## 2015-06-16 MED ORDER — LIDOCAINE-EPINEPHRINE 1 %-1:100000 IJ SOLN
INTRAMUSCULAR | Status: DC | PRN
  Administered 2015-06-16: 7 mL

## 2015-06-16 MED ORDER — ONDANSETRON HCL 4 MG/2ML IJ SOLN
INTRAMUSCULAR | Status: AC
  Filled 2015-06-16: qty 2

## 2015-06-16 MED ORDER — LIDOCAINE-EPINEPHRINE 1 %-1:100000 IJ SOLN
INTRAMUSCULAR | Status: AC
  Filled 2015-06-16: qty 1

## 2015-06-16 MED ORDER — LACTATED RINGERS IV SOLN
INTRAVENOUS | Status: DC
  Administered 2015-06-16 (×2): via INTRAVENOUS

## 2015-06-16 MED ORDER — LIDOCAINE HCL (CARDIAC) 20 MG/ML IV SOLN
INTRAVENOUS | Status: AC
  Filled 2015-06-16: qty 5

## 2015-06-16 MED ORDER — KETOROLAC TROMETHAMINE 30 MG/ML IJ SOLN
INTRAMUSCULAR | Status: AC
  Filled 2015-06-16: qty 1

## 2015-06-16 MED ORDER — HYDROMORPHONE HCL 1 MG/ML IJ SOLN: 0.2500 mg | INTRAMUSCULAR | Status: DC | PRN

## 2015-06-16 MED ORDER — KETOROLAC TROMETHAMINE 30 MG/ML IJ SOLN
INTRAMUSCULAR | Status: DC | PRN
  Administered 2015-06-16: 30 mg via INTRAVENOUS

## 2015-06-16 MED ORDER — PROPOFOL 10 MG/ML IV BOLUS
INTRAVENOUS | Status: DC | PRN
  Administered 2015-06-16: 180 mg via INTRAVENOUS

## 2015-06-16 MED ORDER — MIDAZOLAM HCL 2 MG/2ML IJ SOLN
INTRAMUSCULAR | Status: DC | PRN
  Administered 2015-06-16: 2 mg via INTRAVENOUS

## 2015-06-16 MED ORDER — FENTANYL CITRATE (PF) 250 MCG/5ML IJ SOLN
INTRAMUSCULAR | Status: AC
  Filled 2015-06-16: qty 5

## 2015-06-16 MED ORDER — ONDANSETRON HCL 4 MG/2ML IJ SOLN: 4.0000 mg | Freq: Once | INTRAMUSCULAR | Status: DC | PRN

## 2015-06-16 MED ORDER — ONDANSETRON HCL 4 MG/2ML IJ SOLN
INTRAMUSCULAR | Status: DC | PRN
Start: 2015-06-16 — End: 2015-06-16
  Administered 2015-06-16: 4 mg via INTRAVENOUS

## 2015-06-16 MED ORDER — GLYCOPYRROLATE 0.2 MG/ML IJ SOLN
INTRAMUSCULAR | Status: DC | PRN
  Administered 2015-06-16 (×2): 0.1 mg via INTRAVENOUS

## 2015-06-16 MED ORDER — PHENYLEPHRINE HCL 10 MG/ML IJ SOLN
INTRAMUSCULAR | Status: DC | PRN
  Administered 2015-06-16: 40 ug via INTRAVENOUS

## 2015-06-16 MED ORDER — FENTANYL CITRATE (PF) 250 MCG/5ML IJ SOLN
INTRAMUSCULAR | Status: DC | PRN
  Administered 2015-06-16: 100 ug via INTRAVENOUS

## 2015-06-16 MED ORDER — PROPOFOL 10 MG/ML IV BOLUS
INTRAVENOUS | Status: AC
  Filled 2015-06-16: qty 20

## 2015-06-16 MED ORDER — SCOPOLAMINE 1 MG/3DAYS TD PT72: 1.0000 | MEDICATED_PATCH | Freq: Once | TRANSDERMAL | Status: DC

## 2015-06-16 MED ORDER — DEXAMETHASONE SODIUM PHOSPHATE 10 MG/ML IJ SOLN
INTRAMUSCULAR | Status: AC
  Filled 2015-06-16: qty 1

## 2015-06-16 MED ORDER — LIDOCAINE HCL (CARDIAC) 20 MG/ML IV SOLN
INTRAVENOUS | Status: DC | PRN
  Administered 2015-06-16: 60 mg via INTRAVENOUS

## 2015-06-16 MED ORDER — DEXAMETHASONE SODIUM PHOSPHATE 4 MG/ML IJ SOLN
INTRAMUSCULAR | Status: DC | PRN
  Administered 2015-06-16: 4 mg via INTRAVENOUS

## 2015-06-16 MED ORDER — MIDAZOLAM HCL 2 MG/2ML IJ SOLN
INTRAMUSCULAR | Status: AC
  Filled 2015-06-16: qty 2

## 2015-06-16 MED ORDER — MEPERIDINE HCL 25 MG/ML IJ SOLN: 6.2500 mg | INTRAMUSCULAR | Status: DC | PRN

## 2015-06-16 SURGICAL SUPPLY — 22 items
ABLATOR ENDOMETRIAL BIPOLAR (ABLATOR) ×1 IMPLANT
CANISTER SUCT 3000ML (MISCELLANEOUS) ×2 IMPLANT
CATH ROBINSON RED A/P 16FR (CATHETERS) ×2 IMPLANT
CLOTH BEACON ORANGE TIMEOUT ST (SAFETY) ×2 IMPLANT
CONTAINER PREFILL 10% NBF 60ML (FORM) ×4 IMPLANT
DEVICE MYOSURE LITE (MISCELLANEOUS) IMPLANT
DEVICE MYOSURE REACH (MISCELLANEOUS) IMPLANT
DILATOR CANAL MILEX (MISCELLANEOUS) IMPLANT
ELECT REM PT RETURN 9FT ADLT (ELECTROSURGICAL)
ELECTRODE REM PT RTRN 9FT ADLT (ELECTROSURGICAL) IMPLANT
FILTER ARTHROSCOPY CONVERTOR (FILTER) ×2 IMPLANT
GLOVE BIOGEL PI IND STRL 7.0 (GLOVE) ×2 IMPLANT
GLOVE BIOGEL PI INDICATOR 7.0 (GLOVE) ×2
GLOVE ECLIPSE 6.5 STRL STRAW (GLOVE) ×4 IMPLANT
GOWN STRL REUS W/TWL LRG LVL3 (GOWN DISPOSABLE) ×4 IMPLANT
PACK VAGINAL MINOR WOMEN LF (CUSTOM PROCEDURE TRAY) ×2 IMPLANT
PAD OB MATERNITY 4.3X12.25 (PERSONAL CARE ITEMS) ×2 IMPLANT
SEAL ROD LENS SCOPE MYOSURE (ABLATOR) ×2 IMPLANT
TOWEL OR 17X24 6PK STRL BLUE (TOWEL DISPOSABLE) ×4 IMPLANT
TUBING AQUILEX INFLOW (TUBING) ×2 IMPLANT
TUBING AQUILEX OUTFLOW (TUBING) ×2 IMPLANT
WATER STERILE IRR 1000ML POUR (IV SOLUTION) ×2 IMPLANT

## 2015-06-16 NOTE — Discharge Instructions (Signed)
Post-surgical Instructions, Outpatient Surgery  You may expect to feel dizzy, weak, and drowsy for as long as 24 hours after receiving the medicine that made you sleep (anesthetic). For the first 24 hours after your surgery:    Do not drive a car, ride a bicycle, participate in physical activities, or take public transportation until you are done taking narcotic pain medicines or as directed by Dr. Sabra Heck.   Do not drink alcohol or take tranquilizers.   Do not take medicine that has not been prescribed by your physicians.   Do not sign important papers or make important decisions while on narcotic pain medicines.   Have a responsible person with you.   PAIN MANAGEMENT  Motrin 800mg .  (This is the same as 4-200mg  over the counter tablets of Motrin or ibuprofen.)  You may take this every eight hours or as needed for cramping.    Percocet 5/325mg .  For more severe pain, take one or two tablets every four to six hours as needed for pain control.  (Remember that narcotic pain medications increase your risk of constipation.  If this becomes a problem, you may take an over the counter stool softener like Colace 100mg  up to four times a day.)  Valium 5mg .  If the percocet doesn't work, you can then take 1/2 to one tab of valium.    DO'S AND DON'T'S  Do not take a tub bath for one week.  You may shower on the first day after your surgery  Do not do any heavy lifting for one to two weeks.  This increases the chance of bleeding.  Do move around as you feel able.  Stairs are fine.  You may begin to exercise again as you feel able.  Do not lift any weights for two weeks.  This can increase bleeding.  Do not put anything in the vagina for two weeks--no tampons, intercourse, or douching.    REGULAR MEDIATIONS/VITAMINS:  You may restart all of your regular medications as prescribed.  You may restart all of your vitamins as you normally take them.    PLEASE CALL OR SEEK MEDICAL CARE IF:  You  have persistent nausea and vomiting.   You have trouble eating or drinking.   You have an oral temperature above 100.5.   You have constipation that is not helped by adjusting diet or increasing fluid intake. Pain medicines are a common cause of constipation.   You have heavy vaginal bleeding

## 2015-06-16 NOTE — Transfer of Care (Signed)
Immediate Anesthesia Transfer of Care Note  Patient: Angela Wells  Procedure(s) Performed: Procedure(s): DILATATION & CURETTAGE/HYSTEROSCOPY WITH Novasure (N/A)  Patient Location: PACU  Anesthesia Type:General  Level of Consciousness: awake, alert  and oriented  Airway & Oxygen Therapy: Patient Spontanous Breathing and Patient connected to nasal cannula oxygen  Post-op Assessment: Report given to RN and Post -op Vital signs reviewed and stable  Post vital signs: Reviewed and stable  Last Vitals:  Filed Vitals:   06/16/15 0601  BP: 117/64  Pulse: 55  Temp: 36.6 C  Resp: 20    Complications: No apparent anesthesia complications

## 2015-06-16 NOTE — Op Note (Signed)
06/16/2015  7:55 AM  PATIENT:  Angela Wells  45 y.o. female  PRE-OPERATIVE DIAGNOSIS:  DUB with menorrhagia, possible endometrial polyp,   POST-OPERATIVE DIAGNOSIS:  DUB with menorrhagia, possible endometrial polyp,   PROCEDURE:  Procedure(s): DILATATION & CURETTAGE/HYSTEROSCOPY WITH Novasure NOVASURE ABLATION  SURGEON:  Finian Helvey SUZANNE  ASSISTANTS: OR staff   ANESTHESIA:   general, Dr. Royce Macadamia oversaw the case  ESTIMATED BLOOD LOSS: 5cc  BLOOD ADMINISTERED:none   FLUIDS: 1800cc LR (1L of this given in the pre-op area)  UOP: 30cc drained with I&O cath  SPECIMEN:  Endometrial curettings  DISPOSITION OF SPECIMEN:  PATHOLOGY  FINDINGS: thin endometrium  DESCRIPTION OF OPERATION: Patient was taken to the operating room.  She is placed in the supine position. SCDs were on her lower extremities and functioning properly. General anesthesia with an LMA was administered without difficulty. Dr. Royce Macadamia oversaw case.  Legs were then placed in the [place] in the low lithotomy position. The legs were lifted to the high lithotomy position and the Betadine prep was used on the inner thighs perineum and vagina x3. Patient was draped in a normal standard fashion. An in and out catheterization with a red rubber Foley catheter was performed. Approximately 30 cc of clear urine was noted. A bivalve speculum was placed the vagina. The anterior lip of the cervix was grasped with single-tooth tenaculum.  A paracervical block of 1% lidocaine mixed one-to-one with epinephrine (1:100,000 units).  10 cc was used total. The cervix is dilated up to #21 [facility] dilators. The endometrial cavity sounded to 7.5 cm.   A 2.9 millimeter diagnostic hysteroscope was obtained.  Saline was used as a hysteroscopic fluid. The hysteroscope was advanced through the endocervical canal into the endometrial cavity. The tubal ostia were noted bilaterally.  There was some irregular appearing endometrium on the posterior  uterine cavity.  This may have been the are where the biopsy in January showed an endometrial polyp.  Decision made to proceed with curettage.  Hysteroscope was removed.  Using #1 toothed curette, the endometrial cavity was scraped until a rough gritty texture was noted in all quadrants.  Tissue was sent for pathology.  The hysteroscope was used to re visualize the cavity and the area seen previously was now gone.    Next the Novasure device was obtained.  Cervical length was 3.5cm.  The device was opened to ensure working properly.  Then the device was passed into the endometrial cavity.  It was opened and the device seated.  The cavity was 3.6.  These settings were applied.  The power was 79.  Then the cavity assessment test was performed.  Once this was passed, the ablation cycle was begun.  The ablation cycle lasted 79 seconds.  Once the ablation was completed, the device was removed.  The cavity was re visualized and good ablation was noted.  Photo documentation was obtained.  At this point, the procedure was ended.  The hysteroscope was removed.  The fluid deficit was 88 cc. The tenaculum was removed from the anterior lip of the cervix. The speculum was returned vagina. The prep was cleansed of the patient's skin. The legs are positioned back in the supine position. Sponge, lap, needle, initially counts were correct x2. Patient was taken to recovery in stable condition.  COUNTS:  YES  PLAN OF CARE: Transfer to PACU

## 2015-06-16 NOTE — Anesthesia Postprocedure Evaluation (Signed)
Anesthesia Post Note  Patient: Angela Wells  Procedure(s) Performed: Procedure(s) (LRB): DILATATION & CURETTAGE/HYSTEROSCOPY WITH Novasure (N/A)  Patient location during evaluation: PACU Anesthesia Type: General Level of consciousness: awake and alert Pain management: pain level controlled Vital Signs Assessment: post-procedure vital signs reviewed and stable Respiratory status: spontaneous breathing, nonlabored ventilation, respiratory function stable and patient connected to nasal cannula oxygen Cardiovascular status: blood pressure returned to baseline and stable Postop Assessment: no signs of nausea or vomiting Anesthetic complications: no    Last Vitals:  Filed Vitals:   06/16/15 0900 06/16/15 0909  BP: 117/65 117/65  Pulse: 63 53  Temp:    Resp: 17 17    Last Pain:  Filed Vitals:   06/16/15 0911  PainSc: 2                  Chelcea Zahn DAVID

## 2015-06-16 NOTE — Anesthesia Procedure Notes (Signed)
Procedure Name: LMA Insertion Date/Time: 06/16/2015 7:26 AM Performed by: Flossie Dibble Pre-anesthesia Checklist: Patient identified, Timeout performed, Emergency Drugs available, Suction available and Patient being monitored Patient Re-evaluated:Patient Re-evaluated prior to inductionOxygen Delivery Method: Circle system utilized Preoxygenation: Pre-oxygenation with 100% oxygen Intubation Type: IV induction LMA: LMA inserted LMA Size: 4.0 Number of attempts: 1 Placement Confirmation: breath sounds checked- equal and bilateral and positive ETCO2 Tube secured with: Tape Dental Injury: Teeth and Oropharynx as per pre-operative assessment

## 2015-06-16 NOTE — Anesthesia Preprocedure Evaluation (Signed)
Anesthesia Evaluation  Patient identified by MRN, date of birth, ID band Patient awake    Reviewed: Allergy & Precautions, NPO status , Patient's Chart, lab work & pertinent test results  Airway Mallampati: I  TM Distance: >3 FB Neck ROM: Full    Dental   Pulmonary former smoker,    Pulmonary exam normal        Cardiovascular hypertension, Pt. on medications Normal cardiovascular exam     Neuro/Psych Anxiety Depression    GI/Hepatic   Endo/Other    Renal/GU      Musculoskeletal   Abdominal   Peds  Hematology   Anesthesia Other Findings   Reproductive/Obstetrics                             Anesthesia Physical Anesthesia Plan  ASA: II  Anesthesia Plan: General   Post-op Pain Management:    Induction: Intravenous  Airway Management Planned: LMA  Additional Equipment:   Intra-op Plan:   Post-operative Plan: Extubation in OR  Informed Consent: I have reviewed the patients History and Physical, chart, labs and discussed the procedure including the risks, benefits and alternatives for the proposed anesthesia with the patient or authorized representative who has indicated his/her understanding and acceptance.     Plan Discussed with: CRNA and Surgeon  Anesthesia Plan Comments:         Anesthesia Quick Evaluation

## 2015-06-16 NOTE — H&P (Signed)
Angela Wells is an 45 y.o. female  G4P2 MWF here for treatment of menorrhagia and endometrial biopsy.  Pt has decided on hysteroscopy with polyp removal and Novasure endometrial ablation.  Pt had negative endometrial biopsy 1/17 but this showed and endometrial polyp.  Last PUS was 9/16 showing uterus 6 x 4 x 3.5cm with 36mm endometrial thickness.  Pt has been on micronor for a short while but does not with to continue this due to this being a hormonal medication.  Risks and benefits and alternative have been reviewed.  Pt is here and ready to proceed.  Pertinent Gynecological History: Menses: irregular and heavy Contraception: tubal ligation DES exposure: denies Blood transfusions: none Sexually transmitted diseases: no past history Previous GYN Procedures: BTL with attempted essure  Last mammogram: normal Date: 12/16 Last pap: normal Date: 8/16 OB History: G4, P2   Menstrual History: No LMP recorded. Patient is not currently having periods (Reason: Perimenopausal).    Past Medical History  Diagnosis Date  . Anxiety   . Abnormal Pap smear   . Eczema   . Dyslipidemia with elevated low density lipoprotein (LDL) cholesterol and abnormally low high density lipoprotein cholesterol     diet controlled  . Hypertension     resolved - lost weight, no med  . Depression   . Arthritis     hands  . SVD (spontaneous vaginal delivery)     x 2    Past Surgical History  Procedure Laterality Date  . Tubal ligation  03/04/2011    Procedure: ESSURE TUBAL STERILIZATION;  Surgeon: Felipa Emory;  Location: [facility];  Service: Gynecology;  Laterality: Bilateral;  Attempted  . Laparoscopic tubal ligation  03/04/2011    Procedure: LAPAROSCOPIC TUBAL LIGATION;  Surgeon: Felipa Emory;  Location: [place] [place] [facility];  Service: Gynecology;  Laterality: Bilateral;  with filshie clips  . Gynecologic cryosurgery    . Iud removal  6/12    in cx  . Wisdom tooth extraction      Family History  Problem Relation  Age of Onset  . Hypertension Father   . Thyroid disease Mother     Social History:  reports that she quit smoking about 17 years ago. Her smoking use included Cigarettes. She smoked 0.25 packs per day. She has never used smokeless tobacco. She reports that she drinks alcohol. She reports that she does not use illicit drugs.  Allergies: No Known Allergies  Prescriptions prior to admission  Medication Sig Dispense Refill Last Dose  . acetaminophen (TYLENOL) 325 MG tablet Take 650 mg by mouth every 6 (six) hours as needed for mild pain or headache.     . BL EVENING PRIMROSE OIL PO Take 1 capsule by mouth 2 (two) times daily. Reported on 06/05/2015   06/15/2015 at 2200  . BLACK COHOSH PO Take 1 tablet by mouth 2 (two) times daily. Reported on 06/05/2015   06/15/2015 at 2200  . citalopram (CELEXA) 20 MG tablet Take 1 tablet (20 mg total) by mouth at bedtime. 90 tablet 4 06/15/2015 at 2200  . DHA-EPA-Vitamin E (OMEGA-3 COMPLEX) 192-251-11 MG-MG-UNIT CAPS Take 2 capsules by mouth 2 (two) times daily. Reported on 06/05/2015   Not Taking  . diazepam (VALIUM) 10 MG tablet Take 0.5 tablets (5 mg total) by mouth every 8 (eight) hours as needed (for cramping). 5 tablet 0   . Eflornithine HCl (VANIQA) 13.9 % cream Use thin amount of cream topically twice daily 45 g 5   . HYDROcodone-acetaminophen (NORCO/VICODIN)  5-325 MG tablet Take 1-2 tablets by mouth every 6 (six) hours as needed for moderate pain or severe pain. 20 tablet 0   . Maca Root 500 MG CAPS Take 1 capsule by mouth daily. Reported on 06/05/2015   Not Taking  . Multiple Vitamins-Minerals (MULTIVITAMINS THER. W/MINERALS) TABS Take 1 tablet by mouth daily. Reported on 06/05/2015   Not Taking  . NON FORMULARY Reported on 06/05/2015   Not Taking  . Nutritional Supplements (CATALYTIC FORMULA) TABS Take 1 tablet by mouth daily. Reported on 06/05/2015   Not Taking  . nystatin cream (MYCOSTATIN) Apply 1 application topically 2 (two) times daily. Apply to affected  area BID for up to 7 days. (Patient not taking: Reported on 06/05/2015) 30 g 1 Not Taking  . Pyridoxine HCl (VITAMIN B-6 PO) Take 1 tablet by mouth daily. Reported on 06/05/2015   Not Taking  . Turmeric 500 MG TABS Take 1 tablet by mouth daily. Reported on 06/05/2015   Not Taking  . vitamin B-12 (CYANOCOBALAMIN) 100 MCG tablet Take 100 mcg by mouth daily. Reported on 06/05/2015   Not Taking    Review of Systems  All other systems reviewed and are negative.   Blood pressure 117/64, pulse 55, temperature 97.9 F (36.6 C), temperature source Oral, resp. rate 20, SpO2 100 %. Physical Exam  Constitutional: She appears well-developed and well-nourished.  Cardiovascular: Normal rate and regular rhythm.   Respiratory: Effort normal and breath sounds normal.  Skin: Skin is warm and dry.  Psychiatric: She has a normal mood and affect.    Results for orders placed or performed during the hospital encounter of 06/16/15 (from the past 24 hour(s))  Pregnancy, urine     Status: None   Collection Time: 06/16/15  6:00 AM  Result Value Ref Range   Preg Test, Ur NEGATIVE NEGATIVE    No results found.  Assessment/Plan: 45 yo G4P2 MWF with irregular but heavy menstrual cycles and endometrial polyp and endometrial biopsy specimen, here for hysteroscopy, polyp resection, possible D&C and then novasure ablation.  All questions answered.    Hale Bogus [facility] 06/16/2015, 6:41 AM

## 2015-06-19 ENCOUNTER — Encounter (HOSPITAL_COMMUNITY): Payer: Self-pay | Admitting: Obstetrics & Gynecology

## 2015-07-03 ENCOUNTER — Encounter: Payer: Self-pay | Admitting: Obstetrics & Gynecology

## 2015-07-03 ENCOUNTER — Ambulatory Visit (INDEPENDENT_AMBULATORY_CARE_PROVIDER_SITE_OTHER): Payer: 59 | Admitting: Obstetrics & Gynecology

## 2015-07-03 VITALS — BP 112/70 | HR 60 | Resp 16 | Ht 66.0 in | Wt 174.0 lb

## 2015-07-03 DIAGNOSIS — B372 Candidiasis of skin and nail: Secondary | ICD-10-CM

## 2015-07-03 DIAGNOSIS — N921 Excessive and frequent menstruation with irregular cycle: Secondary | ICD-10-CM | POA: Diagnosis not present

## 2015-07-03 MED ORDER — FLUCONAZOLE 150 MG PO TABS: ORAL_TABLET | ORAL | Status: DC

## 2015-07-03 MED ORDER — TRIAMCINOLONE ACETONIDE 0.5 % EX CREA: 1.0000 "application " | TOPICAL_CREAM | Freq: Three times a day (TID) | CUTANEOUS | Status: DC

## 2015-07-03 NOTE — Progress Notes (Addendum)
Post Operative Visit  Procedure: DILATATION & CURETTAGE/HYSTEROSCOPY WITH MYOSURE Days Post-op: 18 days   Subjective: Doing well.  No vaginal bleeding.  Minimal vaginal discharge.  Took only one pain pill.  Looking forward to improved bleeding.  Pt reports significant external itching that started after the procedure.  This is again on her thigh.  She took diflucan in the fall and this seemed to resolved.  She has not seen a dermatologist.  She did not get antibiotics with the procedure.  Denies vaginal discharge.  Denies urinary symptoms.  Denies fever.  Objective: BP 112/70 mmHg  Pulse 60  Resp 16  Ht 5\' 6"  (1.676 m)  Wt 174 lb (78.926 kg)  BMI 28.10 kg/m2  LMP 04/10/2015  EXAM General: alert and cooperative GI: soft, non-tender; bowel sounds normal; no masses,  no organomegaly Extremities: extremities normal, atraumatic, no cyanosis or edema Vaginal Bleeding: none  Gyn:  NAEFG except for erythematous patch with satellite lesions c/w yeast, vaginal pink and moist, cervix close, scant darkish discharge (consistent with old blood noted), no CMT, no pelvic/adnexal masses  Assessment: s/p Hysteroscopy with D&C and Novasure ablation Yeast on the skin  Plan: Pt knows to call with any irregular bleeding.  Plan follow-up 3-6 months. Diflucan 150mg  for three dosages Kenalog 0.5% cream tid for up to a week.  #30/0RF.  Pt knows to call if symptoms do not fully resolve.

## 2015-07-04 MED FILL — FLUCONAZOLE 150 MG TABLET: 150 | 9 days supply | Qty: 3 | Fill #0

## 2015-07-04 MED FILL — TRIAMCINOLONE 0.5% CREAM: 0.5 | 10 days supply | Qty: 30 | Fill #0

## 2015-07-06 DIAGNOSIS — L821 Other seborrheic keratosis: Secondary | ICD-10-CM | POA: Diagnosis not present

## 2015-07-06 DIAGNOSIS — D2262 Melanocytic nevi of left upper limb, including shoulder: Secondary | ICD-10-CM | POA: Diagnosis not present

## 2015-07-06 DIAGNOSIS — D225 Melanocytic nevi of trunk: Secondary | ICD-10-CM | POA: Diagnosis not present

## 2015-07-06 DIAGNOSIS — D1801 Hemangioma of skin and subcutaneous tissue: Secondary | ICD-10-CM | POA: Diagnosis not present

## 2015-07-06 DIAGNOSIS — D2271 Melanocytic nevi of right lower limb, including hip: Secondary | ICD-10-CM | POA: Diagnosis not present

## 2015-07-06 DIAGNOSIS — D2272 Melanocytic nevi of left lower limb, including hip: Secondary | ICD-10-CM | POA: Diagnosis not present

## 2015-07-06 DIAGNOSIS — D2261 Melanocytic nevi of right upper limb, including shoulder: Secondary | ICD-10-CM | POA: Diagnosis not present

## 2015-07-06 DIAGNOSIS — D2211 Melanocytic nevi of right eyelid, including canthus: Secondary | ICD-10-CM | POA: Diagnosis not present

## 2015-07-06 DIAGNOSIS — D2372 Other benign neoplasm of skin of left lower limb, including hip: Secondary | ICD-10-CM | POA: Diagnosis not present

## 2015-07-11 DIAGNOSIS — H524 Presbyopia: Secondary | ICD-10-CM | POA: Diagnosis not present

## 2015-07-11 DIAGNOSIS — H5213 Myopia, bilateral: Secondary | ICD-10-CM | POA: Diagnosis not present

## 2015-07-11 DIAGNOSIS — H52223 Regular astigmatism, bilateral: Secondary | ICD-10-CM | POA: Diagnosis not present

## 2015-07-21 MED FILL — CITALOPRAM HBR 20 MG TABLET: 20 | 90 days supply | Qty: 90 | Fill #2

## 2015-08-21 ENCOUNTER — Other Ambulatory Visit: Payer: Self-pay | Admitting: Obstetrics & Gynecology

## 2015-08-22 ENCOUNTER — Telehealth: Payer: Self-pay

## 2015-08-22 ENCOUNTER — Other Ambulatory Visit: Payer: Self-pay | Admitting: Obstetrics & Gynecology

## 2015-08-22 MED ORDER — FLUCONAZOLE 150 MG PO TABS: ORAL_TABLET | ORAL | Status: DC

## 2015-08-22 MED FILL — FLUCONAZOLE 150 MG TABLET: 150 | 9 days supply | Qty: 3 | Fill #0

## 2015-08-22 NOTE — Telephone Encounter (Signed)
Left detailed message at number provided [phone], okay per ROI. Advised rx for Diflucan has been refilled to her pharmacy on file. Advised Dr.Miller recommends that she seek evaluation with dermatology as well. Advised she may contact [facility] at her convenience to schedule appointment. Advised to return call to the office with any further questions.  Routing to provider for final review. Patient agreeable to disposition. Will close encounter.

## 2015-08-22 NOTE — Telephone Encounter (Signed)
Medication Renewal Request  Message [id number]   From  [place], MD   Sent  08/22/2015 2:49 PM     Original authorizing provider: Lyman Speller, MD   Angela Wells would like a refill of the following medications:  fluconazole (DIFLUCAN) 150 MG tablet [MILLER, Satira Anis, MD]   Preferred pharmacy: [place], [place]   Comment:      Responsible Party    Pool - [facility] No one has taken responsibility for this message.     No actions have been taken on this message.     Spoke with patient. Patient states that she is experiencing itching on her thigh. Reports she took 3 diflucan, 1 tablet every 3 days as directed by Dr.Miller. Reports this got rid of her skin irritation and itching. Since then she has been using Kenalog cream as needed. 1 week ago skin irritation and itching on thigh began again. Reports Kenalog cream is not helping. Requesting a refill of Diflucan at this time. Advised I will speak with Dr.Miller and return call with further recommendations. She is agreeable.

## 2015-08-22 NOTE — Telephone Encounter (Signed)
The Diflucan refill is fine but I think she should also plan to see a dermatologist.  Thanks.

## 2015-10-23 MED FILL — CITALOPRAM HBR 20 MG TABLET: 20 | 90 days supply | Qty: 90 | Fill #3

## 2015-12-21 ENCOUNTER — Encounter: Payer: Self-pay | Admitting: Obstetrics & Gynecology

## 2016-01-26 ENCOUNTER — Other Ambulatory Visit: Payer: Self-pay | Admitting: Obstetrics & Gynecology

## 2016-01-26 NOTE — Telephone Encounter (Signed)
Medication refill request: CITALOPRAM Last AEX:  11/10/14 Next AEX: 02/23/16 Last MMG (if hormonal medication request): 03/17/15, Bi-Rads 1:  Negative Last filled: 06/05/15, #90 with 4 refills  I spoke to [facility], refill request was made from expired prescription. She does not see refill authorization from 06/05/15.  Verbal order given for 90 day supply with one additional refill until 05/2016.  Routing to provider for final review.

## 2016-02-13 ENCOUNTER — Other Ambulatory Visit (HOSPITAL_BASED_OUTPATIENT_CLINIC_OR_DEPARTMENT_OTHER): Payer: 59

## 2016-02-13 ENCOUNTER — Other Ambulatory Visit: Payer: Self-pay | Admitting: Gynecologic Oncology

## 2016-02-13 ENCOUNTER — Encounter: Payer: Self-pay | Admitting: Gynecologic Oncology

## 2016-02-13 DIAGNOSIS — R3989 Other symptoms and signs involving the genitourinary system: Secondary | ICD-10-CM | POA: Diagnosis not present

## 2016-02-13 DIAGNOSIS — R109 Unspecified abdominal pain: Secondary | ICD-10-CM

## 2016-02-13 LAB — URINALYSIS, MICROSCOPIC - CHCC
Bilirubin (Urine): NEGATIVE
Blood: NEGATIVE
GLUCOSE UR CHCC: NEGATIVE mg/dL
KETONES: NEGATIVE mg/dL
LEUKOCYTE ESTERASE: NEGATIVE
NITRITE: NEGATIVE
PROTEIN: NEGATIVE mg/dL
RBC / HPF: NEGATIVE (ref 0–2)
Specific Gravity, Urine: 1.01 (ref 1.003–1.035)
UROBILINOGEN UR: 0.2 mg/dL (ref 0.2–1)
pH: 6.5 (ref 4.6–8.0)

## 2016-02-13 NOTE — Progress Notes (Signed)
Patient presented today to the office with complaints of persistent lower abdominal pressure.  Mild frequency reported.  Denies hematuria, urgency.  No fever reported.  Urine sample obtained for further testing.

## 2016-02-14 LAB — URINE CULTURE

## 2016-02-23 ENCOUNTER — Encounter: Payer: Self-pay | Admitting: Obstetrics & Gynecology

## 2016-02-23 ENCOUNTER — Ambulatory Visit (INDEPENDENT_AMBULATORY_CARE_PROVIDER_SITE_OTHER): Payer: 59 | Admitting: Obstetrics & Gynecology

## 2016-02-23 VITALS — BP 112/82 | HR 62 | Resp 16 | Ht 66.5 in | Wt 182.0 lb

## 2016-02-23 DIAGNOSIS — Z124 Encounter for screening for malignant neoplasm of cervix: Secondary | ICD-10-CM

## 2016-02-23 DIAGNOSIS — Z01419 Encounter for gynecological examination (general) (routine) without abnormal findings: Secondary | ICD-10-CM | POA: Diagnosis not present

## 2016-02-23 NOTE — Progress Notes (Signed)
45 y.o. G4P2 MarriedCaucasianF here for annual exam.  Still cycling very minimally.  Having some PMS but not much bleeding.  Doing well.    Decided to stop her Celexa about a month ago.  Using some supplements.    PCP:  Janie Morning, DO.  In [place].    Patient's last menstrual period was 01/12/2016.          Sexually active: Yes.    The current method of family planning is tubal ligation.    Exercising: Yes.    Spin, jogging, Hit Smoker:  no  Health Maintenance: Pap:  11/10/14 Neg. 02/25/11 Neg. HR HPV:neg  History of abnormal Pap:  yes MMG:  03/17/15 BIRADS1:neg  Colonoscopy:  Never BMD:   Never TDaP:  06/2008  Pneumonia vaccine(s):  N/A Zostavax:   N/A Hep C testing: Unot indicated Screening Labs: Not today   reports that she quit smoking about 17 years ago. Her smoking use included Cigarettes. She smoked 0.25 packs per day. She has never used smokeless tobacco. She reports that she drinks alcohol. She reports that she does not use drugs.  Past Medical History:  Diagnosis Date  . Abnormal Pap smear   . Anxiety   . Arthritis    hands  . Depression   . Dyslipidemia with elevated low density lipoprotein (LDL) cholesterol and abnormally low high density lipoprotein cholesterol    diet controlled  . Eczema   . Hypertension    resolved - lost weight, no med  . SVD (spontaneous vaginal delivery)    x 2    Past Surgical History:  Procedure Laterality Date  . DILATATION & CURETTAGE/HYSTEROSCOPY WITH MYOSURE N/A 06/16/2015   Procedure: DILATATION & CURETTAGE/HYSTEROSCOPY WITH Novasure;  Surgeon: [name], MD;  Location: [facility];  Service: Gynecology;  Laterality: N/A;  . GYNECOLOGIC CRYOSURGERY    . IUD REMOVAL  6/12   in cx  . LAPAROSCOPIC TUBAL LIGATION  03/04/2011   Procedure: LAPAROSCOPIC TUBAL LIGATION;  Surgeon: Felipa Emory;  Location: [facility];  Service: Gynecology;  Laterality: Bilateral;  with filshie clips  . TUBAL LIGATION  03/04/2011   Procedure: ESSURE  TUBAL STERILIZATION;  Surgeon: Felipa Emory;  Location: [facility];  Service: Gynecology;  Laterality: Bilateral;  Attempted  . WISDOM TOOTH EXTRACTION      Current Outpatient Prescriptions  Medication Sig Dispense Refill  . 5-HTP CAPS Take by mouth daily.    Marland Kitchen acetaminophen (TYLENOL) 325 MG tablet Take 650 mg by mouth every 6 (six) hours as needed for mild pain or headache.    . BL EVENING PRIMROSE OIL PO Take 1 capsule by mouth 2 (two) times daily. Reported on 06/05/2015    . BLACK COHOSH PO Take 1 tablet by mouth 2 (two) times daily. Reported on 06/05/2015    . DHA-EPA-Vitamin E (OMEGA-3 COMPLEX) 192-251-11 MG-MG-UNIT CAPS Take 2 capsules by mouth 2 (two) times daily. Reported on 06/05/2015    . L-THEANINE PO Take by mouth daily.    . Multiple Vitamins-Minerals (MULTIVITAMINS THER. W/MINERALS) TABS Take 1 tablet by mouth daily. Reported on 06/05/2015    . Nutritional Supplements (CATALYTIC FORMULA) TABS Take 1 tablet by mouth daily. Reported on 06/05/2015    . nystatin cream (MYCOSTATIN) Apply 1 application topically 2 (two) times daily. Apply to affected area BID for up to 7 days. 30 g 1  . triamcinolone cream (KENALOG) 0.5 % Apply 1 application topically 3 (three) times daily. 30 g 0  . Turmeric 500  MG TABS Take 1 tablet by mouth daily. Reported on 06/05/2015    . vitamin B-12 (CYANOCOBALAMIN) 100 MCG tablet Take 100 mcg by mouth daily. Reported on 06/05/2015     No current facility-administered medications for this visit.     Family History  Problem Relation Age of Onset  . Hypertension Father   . Thyroid disease Mother     ROS:  Pertinent items are noted in HPI.  Otherwise, a comprehensive ROS was negative.  Exam:   BP 112/82 (BP Location: Right Arm, Patient Position: Sitting, Cuff Size: Normal)   Pulse 62   Resp 16   Ht 5' 6.5" (1.689 m)   Wt 182 lb (82.6 kg)   LMP 01/12/2016   BMI 28.94 kg/m   Weight change: +6#  Height: 5' 6.5" (168.9 cm)  Ht Readings from Last 3 Encounters:   02/23/16 5' 6.5" (1.689 m)  07/03/15 5\' 6"  (1.676 m)  06/12/15 5\' 6"  (1.676 m)    General appearance: alert, cooperative and appears stated age Head: Normocephalic, without obvious abnormality, atraumatic Neck: no adenopathy, supple, symmetrical, trachea midline and thyroid normal to inspection and palpation Lungs: clear to auscultation bilaterally Breasts: normal appearance, no masses or tenderness Heart: regular rate and rhythm Abdomen: soft, non-tender; bowel sounds normal; no masses,  no organomegaly Extremities: extremities normal, atraumatic, no cyanosis or edema Skin: Skin color, texture, turgor normal. No rashes or lesions Lymph nodes: Cervical, supraclavicular, and axillary nodes normal. No abnormal inguinal nodes palpated Neurologic: Grossly normal  Pelvic: External genitalia:  no lesions              Urethra:  normal appearing urethra with no masses, tenderness or lesions              Bartholins and Skenes: normal                 Vagina: normal appearing vagina with normal color and discharge, no lesions              Cervix: no lesions              Pap taken: Yes.   Bimanual Exam:  Uterus:  normal size, contour, position, consistency, mobility, non-tender              Adnexa: normal adnexa and no mass, fullness, tenderness               Rectovaginal: Confirms               Anus:  normal sphincter tone, no lesions  Chaperone was present for exam.  A:   Well Woman with normal exam H/O BTL H/O Eczema Hypertension, resolved with weight loss Perimenopausal bleeding Family hx of elevated cholesterol  P: Mammogram yearly. D/W 3D due to breast density Pap and HR HPV obtained today. Stopped Celexa this fall and doing well off of it return annually or prn

## 2016-02-27 LAB — PAP IG AND HPV HIGH-RISK: HPV DNA High Risk: NOT DETECTED

## 2016-02-29 ENCOUNTER — Other Ambulatory Visit: Payer: Self-pay | Admitting: Obstetrics & Gynecology

## 2016-02-29 DIAGNOSIS — Z1231 Encounter for screening mammogram for malignant neoplasm of breast: Secondary | ICD-10-CM

## 2016-04-02 ENCOUNTER — Ambulatory Visit
Admission: RE | Admit: 2016-04-02 | Discharge: 2016-04-02 | Disposition: A | Discharge status: Home / Self Care | Payer: 59 | Source: Ambulatory Visit | Attending: Obstetrics & Gynecology | Admitting: Obstetrics & Gynecology

## 2016-04-02 DIAGNOSIS — Z1231 Encounter for screening mammogram for malignant neoplasm of breast: Secondary | ICD-10-CM

## 2016-07-01 ENCOUNTER — Encounter: Payer: Self-pay | Admitting: Obstetrics & Gynecology

## 2016-07-01 ENCOUNTER — Telehealth: Payer: Self-pay | Admitting: *Deleted

## 2016-07-01 NOTE — Telephone Encounter (Signed)
Spoke with patient. Patient states natural supplements initially worked for hot flashes, but no longer works. Hot flashes are getting worse and keeping her up at night making her miserable, would like to try something else. Recommended OV for evaluation and discussion of HRT, patient scheduled for 07/04/16 at 9am with Dr. Sabra Heck. Patient is agreeable to date and time.  Routing to provider for final review. Patient is agreeable to disposition. Will close encounter.    From [name] To [name], MD Sent 07/01/2016 1:05 PM  I have been taking natural supplements for hot flashes:Black Cohosh,Evening Primrose Oil,Wild Yam Root and this is no longer working for me. I am waking up 2-5 times a night and now request help with another method. Thank you  Angela Wells  [phone]  [phone]

## 2016-07-01 NOTE — Telephone Encounter (Signed)
See telephone encounter dated 07/01/16. 

## 2016-07-04 ENCOUNTER — Ambulatory Visit (INDEPENDENT_AMBULATORY_CARE_PROVIDER_SITE_OTHER): Payer: 59 | Admitting: Obstetrics & Gynecology

## 2016-07-04 ENCOUNTER — Encounter: Payer: Self-pay | Admitting: Obstetrics & Gynecology

## 2016-07-04 VITALS — BP 102/66 | HR 64 | Resp 16 | Wt 171.0 lb

## 2016-07-04 DIAGNOSIS — Z0000 Encounter for general adult medical examination without abnormal findings: Secondary | ICD-10-CM | POA: Diagnosis not present

## 2016-07-04 DIAGNOSIS — N951 Menopausal and female climacteric states: Secondary | ICD-10-CM | POA: Diagnosis not present

## 2016-07-04 LAB — CBC
HEMATOCRIT: 40.5 % (ref 35.0–45.0)
HEMOGLOBIN: 13.6 g/dL (ref 11.7–15.5)
MCH: 30.4 pg (ref 27.0–33.0)
MCHC: 33.6 g/dL (ref 32.0–36.0)
MCV: 90.6 fL (ref 80.0–100.0)
MPV: 9.4 fL (ref 7.5–12.5)
PLATELETS: 279 10*3/uL (ref 140–400)
RBC: 4.47 MIL/uL (ref 3.80–5.10)
RDW: 12.9 % (ref 11.0–15.0)
WBC: 6.2 10*3/uL (ref 3.8–10.8)

## 2016-07-04 LAB — LIPID PANEL
CHOL/HDL RATIO: 3.6 ratio (ref <5.0)
Cholesterol: 293 mg/dL — ABNORMAL HIGH (ref <200)
HDL: 82 mg/dL (ref >50)
LDL Cholesterol: 195 mg/dL — ABNORMAL HIGH (ref <100)
Triglycerides: 82 mg/dL (ref <150)
VLDL: 16 mg/dL (ref <30)

## 2016-07-04 LAB — COMPREHENSIVE METABOLIC PANEL
ALT: 18 U/L (ref 6–29)
AST: 20 U/L (ref 10–35)
Albumin: 4.7 g/dL (ref 3.6–5.1)
Alkaline Phosphatase: 23 U/L — ABNORMAL LOW (ref 33–115)
BUN: 21 mg/dL (ref 7–25)
CALCIUM: 9.9 mg/dL (ref 8.6–10.2)
CO2: 24 mmol/L (ref 20–31)
Chloride: 105 mmol/L (ref 98–110)
Creat: 0.96 mg/dL (ref 0.50–1.10)
GLUCOSE: 85 mg/dL (ref 65–99)
Potassium: 4.5 mmol/L (ref 3.5–5.3)
SODIUM: 142 mmol/L (ref 135–146)
Total Bilirubin: 0.5 mg/dL (ref 0.2–1.2)
Total Protein: 7.2 g/dL (ref 6.1–8.1)

## 2016-07-04 LAB — TSH: TSH: 3.57 m[IU]/L

## 2016-07-04 MED ORDER — GABAPENTIN 100 MG PO CAPS: ORAL_CAPSULE | ORAL | 0 refills | Status: DC

## 2016-07-04 MED FILL — GABAPENTIN 100 MG CAPSULE: 100 | 28 days supply | Qty: 63 | Fill #0

## 2016-07-04 NOTE — Progress Notes (Addendum)
GYNECOLOGY  VISIT   HPI: 46 y.o. G4P2 Married Caucasian female here for discussion of hot flashes and night sweats.  Biggest issue is with sleep due to waking up with hot flashes that cause her to wake up, throw off the covers, and then when she cools down, she is sweaty and then cold.  Definitely disturbing her sleep.  Has tried OTC herbal supplements including black cohosh, wild yam root, evening primrose oil.  She has used some benadryl to help with sleep.  This has helped some but she feels groggy/sleepy with this.    GYNECOLOGIC HISTORY: No LMP recorded. Patient is not currently having periods (Reason: Perimenopausal). Contraception: tubal ligation, PMP  Patient Active Problem List   Diagnosis Date Noted  . Arthritis 11/10/2014    Past Medical History:  Diagnosis Date  . Abnormal Pap smear   . Anxiety   . Arthritis    hands  . Depression   . Dyslipidemia with elevated low density lipoprotein (LDL) cholesterol and abnormally low high density lipoprotein cholesterol    diet controlled  . Eczema   . Hypertension    resolved - lost weight, no med  . SVD (spontaneous vaginal delivery)    x 2    Past Surgical History:  Procedure Laterality Date  . DILATATION & CURETTAGE/HYSTEROSCOPY WITH MYOSURE N/A 06/16/2015   Procedure: DILATATION & CURETTAGE/HYSTEROSCOPY WITH Novasure;  Surgeon: [name], MD;  Location: [facility];  Service: Gynecology;  Laterality: N/A;  . GYNECOLOGIC CRYOSURGERY    . IUD REMOVAL  6/12   in cx  . LAPAROSCOPIC TUBAL LIGATION  03/04/2011   Procedure: LAPAROSCOPIC TUBAL LIGATION;  Surgeon: Felipa Emory;  Location: [facility];  Service: Gynecology;  Laterality: Bilateral;  with filshie clips  . TUBAL LIGATION  03/04/2011   Procedure: ESSURE TUBAL STERILIZATION;  Surgeon: Felipa Emory;  Location: [facility];  Service: Gynecology;  Laterality: Bilateral;  Attempted  . WISDOM TOOTH EXTRACTION      MEDS:  Reviewed in EPIC and UTD  ALLERGIES: Patient has no  known allergies.  Family History  Problem Relation Age of Onset  . Hypertension Father   . Thyroid disease Mother     SH:  Married, non smoker  Review of Systems  All other systems reviewed and are negative.   PHYSICAL EXAMINATION:    BP 102/66 (BP Location: Right Arm, Patient Position: Sitting, Cuff Size: Normal)   Pulse 64   Resp 16   Wt 171 lb (77.6 kg)   BMI 27.19 kg/m     General appearance: alert, cooperative and appears stated age No other exam performed  Assessment: Menopausal symptoms Sleep disturbance  Plan: Discussed with pt options including HRT, SSRI, and gabapentin.  She would like to try gabapentin.  Will start with 100mg  nightly x 7 days, increasing to 200mg  nightly x 7 days, and then 300mg  if needed.  She will call and give update.   Fasting labs will be obtained today.  CBC, CMP, TSH, Vit D, Lipids.   ~15 minutes spent with patient >50% of time was in face to face discussion of above.

## 2016-07-04 NOTE — Addendum Note (Signed)
Addended by: [name] on: 07/04/2016 09:36 AM   Modules accepted: Orders

## 2016-07-05 LAB — VITAMIN D 25 HYDROXY (VIT D DEFICIENCY, FRACTURES): VIT D 25 HYDROXY: 38 ng/mL (ref 30–100)

## 2016-07-08 ENCOUNTER — Encounter: Payer: Self-pay | Admitting: Obstetrics & Gynecology

## 2016-08-02 ENCOUNTER — Other Ambulatory Visit: Payer: Self-pay | Admitting: Obstetrics & Gynecology

## 2016-08-02 ENCOUNTER — Encounter: Payer: Self-pay | Admitting: Obstetrics & Gynecology

## 2016-08-02 MED ORDER — GABAPENTIN 100 MG PO CAPS: ORAL_CAPSULE | ORAL | 3 refills | Status: DC

## 2016-08-02 MED FILL — GABAPENTIN 100 MG CAPSULE: 100 | 90 days supply | Qty: 360 | Fill #0

## 2016-12-11 DIAGNOSIS — Z0000 Encounter for general adult medical examination without abnormal findings: Secondary | ICD-10-CM | POA: Insufficient documentation

## 2016-12-11 NOTE — Progress Notes (Signed)
Subjective:    Patient ID: Angela Wells, female    DOB: 1970-06-14, 46 y.o.   MRN: [id number]  HPI:  Angela Wells is here to establish as a new pt.  She is a very pleasant 46 year old female.  PMH: HTN, HL, Anxiety, Depression, Arthritis, insomnia, and left foot toenail fungus.  She has lost >53 lbs over the last 3 years with regular exercise (cardio and wt training) and macro eating plan- GREAT JOB!  She would still like to loss another 10-15 lbs.  She drinks >80 ounces water/day and avoid tobacco use.  She reports maybe drinking 3-6 beers/month.  She has been struggling with insomnia (early waking) for years and feels that it has worsened since she began menopause.  She has tries various OTC remedies without success.  She also complains of "ugel toenails on my left foot", present since 2015.  She is marries and has children.  Last physical/labs April 2018, unremarkable with exception of HL that she is using TLC to normalize.  Patient Care Team    Relationship Specialty Notifications Start End  Esaw Grandchild, NP PCP - General Family Medicine  12/03/16     Patient Active Problem List   Diagnosis Date Noted  . Insomnia 12/12/2016  . Onychomycosis 12/12/2016  . Healthcare maintenance 12/11/2016  . Arthritis 11/10/2014     Past Medical History:  Diagnosis Date  . Abnormal Pap smear   . Anxiety   . Arthritis    hands  . Depression   . Dyslipidemia with elevated low density lipoprotein (LDL) cholesterol and abnormally low high density lipoprotein cholesterol    diet controlled  . Eczema   . Hypertension    resolved - lost weight, no med  . SVD (spontaneous vaginal delivery)    x 2     Past Surgical History:  Procedure Laterality Date  . DILATATION & CURETTAGE/HYSTEROSCOPY WITH MYOSURE N/A 06/16/2015   Procedure: DILATATION & CURETTAGE/HYSTEROSCOPY WITH Novasure;  Surgeon: [name], MD;  Location: [facility];  Service: Gynecology;  Laterality: N/A;  . GYNECOLOGIC CRYOSURGERY     . IUD REMOVAL  6/12   in cx  . LAPAROSCOPIC TUBAL LIGATION  03/04/2011   Procedure: LAPAROSCOPIC TUBAL LIGATION;  Surgeon: Felipa Emory;  Location: [facility];  Service: Gynecology;  Laterality: Bilateral;  with filshie clips  . TUBAL LIGATION  03/04/2011   Procedure: ESSURE TUBAL STERILIZATION;  Surgeon: Felipa Emory;  Location: [facility];  Service: Gynecology;  Laterality: Bilateral;  Attempted  . WISDOM TOOTH EXTRACTION       Family History  Problem Relation Age of Onset  . Hypertension Father   . Hyperlipidemia Father   . Thyroid disease Mother   . Hypertension Sister   . Hyperlipidemia Sister   . Healthy Sister   . Healthy Daughter   . Healthy Son      History  Drug Use No     History  Alcohol Use  . 0.0 oz/week    Comment: socially beer     History  Smoking Status  . Former Smoker  . Packs/day: 0.25  . Years: 13.00  . Types: Cigarettes  . Quit date: 03/18/1998  Smokeless Tobacco  . Never Used     Outpatient Encounter Prescriptions as of 12/12/2016  Medication Sig  . 5-HTP CAPS Take by mouth daily.  Marland Kitchen acetaminophen (TYLENOL) 325 MG tablet Take 650 mg by mouth every 6 (six) hours as needed for mild pain or headache.  Marland Kitchen  BL EVENING PRIMROSE OIL PO Take 1 capsule by mouth 2 (two) times daily. Reported on 06/05/2015  . BLACK COHOSH PO Take 1 tablet by mouth 2 (two) times daily. Reported on 06/05/2015  . DHA-EPA-Vitamin E (OMEGA-3 COMPLEX) 192-251-11 MG-MG-UNIT CAPS Take 2 capsules by mouth 2 (two) times daily. Reported on 06/05/2015  . L-THEANINE PO Take by mouth daily.  . Multiple Vitamins-Minerals (MULTIVITAMINS THER. W/MINERALS) TABS Take 1 tablet by mouth daily. Reported on 06/05/2015  . Nutritional Supplements (CATALYTIC FORMULA) TABS Take 1 tablet by mouth daily. Reported on 06/05/2015  . nystatin cream (MYCOSTATIN) Apply 1 application topically 2 (two) times daily. Apply to affected area BID for up to 7 days.  Marland Kitchen triamcinolone cream (KENALOG) 0.5 % Apply 1  application topically 3 (three) times daily.  . vitamin B-12 (CYANOCOBALAMIN) 100 MCG tablet Take 100 mcg by mouth daily. Reported on 06/05/2015  . terbinafine (LAMISIL) 250 MG tablet Take 1 tablet (250 mg total) by mouth daily.  Marland Kitchen zolpidem (AMBIEN CR) 6.25 MG CR tablet Take 1 tablet (6.25 mg total) by mouth at bedtime as needed for sleep.  . [DISCONTINUED] gabapentin (NEURONTIN) 100 MG capsule Take 300mg  nightly and 100mg  every am.   No facility-administered encounter medications on file as of 12/12/2016.     Allergies: Patient has no known allergies.  Body mass index is 25.91 kg/m.  Blood pressure 111/68, pulse (!) 54, height 5' 6.5" (1.689 m), weight 163 lb (73.9 kg).      Review of Systems  Constitutional: Positive for fatigue. Negative for activity change, appetite change, chills, diaphoresis, fever and unexpected weight change.  HENT: Negative for congestion.   Eyes: Negative for visual disturbance.  Respiratory: Negative for cough, chest tightness, shortness of breath, wheezing and stridor.   Cardiovascular: Negative for chest pain, palpitations and leg swelling.  Gastrointestinal: Negative for abdominal distention, abdominal pain, blood in stool, constipation, diarrhea, nausea and vomiting.  Endocrine: Negative for cold intolerance, heat intolerance, polydipsia, polyphagia and polyuria.  Genitourinary: Negative for difficulty urinating, dysuria, flank pain, frequency and hematuria.  Musculoskeletal: Negative for arthralgias, back pain, gait problem, joint swelling, myalgias, neck pain and neck stiffness.  Skin: Positive for rash. Negative for color change, pallor and wound.       Toenail fungus L foot 2/4th toes  Neurological: Negative for dizziness, tremors, weakness and headaches.  Hematological: Does not bruise/bleed easily.  Psychiatric/Behavioral: Positive for sleep disturbance. Negative for confusion, decreased concentration, self-injury and suicidal ideas. The patient  is not nervous/anxious.        Objective:   Physical Exam  Constitutional: She is oriented to person, place, and time. She appears well-developed and well-nourished. No distress.  HENT:  Head: Normocephalic and atraumatic.  Right Ear: External ear normal.  Left Ear: External ear normal.  Cardiovascular: Regular rhythm, normal heart sounds and intact distal pulses.  Bradycardia present.   No murmur heard. Well conditioned cardiovascular system  Pulmonary/Chest: Effort normal and breath sounds normal. No respiratory distress. She has no wheezes. She has no rales. She exhibits no tenderness.  Neurological: She is alert and oriented to person, place, and time.  Skin: Skin is warm and dry. She is not diaphoretic.  Onychomycosis on L foot 2/3 toes  Psychiatric: She has a normal mood and affect. Her behavior is normal. Judgment and thought content normal.  Nursing note and vitals reviewed.         Assessment & Plan:   1. Healthcare maintenance   2. Insomnia, unspecified type  3. Onychomycosis     Healthcare maintenance Increase water intake, strive for at least 80 ounces/day.   Follow Heart Healthy diet Increase regular exercise.  Recommend at least 30 minutes daily, 5 days per week of walking, jogging, biking, swimming, YouTube/Pinterest workout videos. Physical with fasting labs April 2019. Keep up the excellent exercise and healthy eating!!!  Insomnia Practices sleep hygiene and used OTC: Diphenhydramine Melatonin Valerian Root None provide adequate/restful sleep. Please take Ambien 6.26 mg as needed for insomnia- if RF needed please call or send MyChart message- OV not required. Unable to check [place] Controlled Substance Database to review controlled substance Rx hx- system down.   Onychomycosis AST 20, ALT 18 checked on 06/2016 Once daily Terbinafine 250mg  daily for toenail fungus- avoid alcohol while taking. We will re-check liver function in 3 months and evaluate  effectiveness of Terbinafine therapy.    FOLLOW-UP:  Return in about 3 months (around 03/13/2017) for Evaluate Medication Effectiveness, ALT check.

## 2016-12-12 ENCOUNTER — Encounter: Payer: Self-pay | Admitting: Adult Health

## 2016-12-12 ENCOUNTER — Ambulatory Visit (INDEPENDENT_AMBULATORY_CARE_PROVIDER_SITE_OTHER): Payer: 59 | Admitting: Adult Health

## 2016-12-12 DIAGNOSIS — Z0000 Encounter for general adult medical examination without abnormal findings: Secondary | ICD-10-CM

## 2016-12-12 DIAGNOSIS — G4700 Insomnia, unspecified: Secondary | ICD-10-CM | POA: Diagnosis not present

## 2016-12-12 DIAGNOSIS — B351 Tinea unguium: Secondary | ICD-10-CM | POA: Diagnosis not present

## 2016-12-12 MED ORDER — TERBINAFINE HCL 250 MG PO TABS: 250.0000 mg | ORAL_TABLET | Freq: Every day | ORAL | 0 refills | Status: DC

## 2016-12-12 MED ORDER — ZOLPIDEM TARTRATE ER 6.25 MG PO TBCR: 6.2500 mg | EXTENDED_RELEASE_TABLET | Freq: Every evening | ORAL | 0 refills | Status: DC | PRN

## 2016-12-12 MED FILL — TERBINAFINE HCL 250 MG TAB: 250 | 90 days supply | Qty: 90 | Fill #0

## 2016-12-12 MED FILL — ZOLPIDEM TART ER 6.25 MG TA: 6.25 | 30 days supply | Qty: 30 | Fill #0

## 2016-12-12 NOTE — Assessment & Plan Note (Signed)
Practices sleep hygiene and used OTC: Diphenhydramine Melatonin Valerian Root None provide adequate/restful sleep. Please take Ambien 6.26 mg as needed for insomnia- if RF needed please call or send MyChart message- OV not required. Unable to check [place] Controlled Substance Database to review controlled substance Rx hx- system down.

## 2016-12-12 NOTE — Assessment & Plan Note (Signed)
AST 20, ALT 18 checked on 06/2016 Once daily Terbinafine 250mg  daily for toenail fungus- avoid alcohol while taking. We will re-check liver function in 3 months and evaluate effectiveness of Terbinafine therapy.

## 2016-12-12 NOTE — Assessment & Plan Note (Signed)
Increase water intake, strive for at least 80 ounces/day.   Follow Heart Healthy diet Increase regular exercise.  Recommend at least 30 minutes daily, 5 days per week of walking, jogging, biking, swimming, YouTube/Pinterest workout videos. Physical with fasting labs April 2019. Keep up the excellent exercise and healthy eating!!!

## 2016-12-12 NOTE — Patient Instructions (Signed)
Heart-Healthy Eating Plan Many factors influence your heart health, including eating and exercise habits. Heart (coronary) risk increases with abnormal blood fat (lipid) levels. Heart-healthy meal planning includes limiting unhealthy fats, increasing healthy fats, and making other small dietary changes. This includes maintaining a healthy body weight to help keep lipid levels within a normal range. What is my plan? Your health care provider recommends that you:  Get no more than __25__% of the total calories in your daily diet from fat.  Limit your intake of saturated fat to less than ___5__% of your total calories each day.  Limit the amount of cholesterol in your diet to less than __300___ mg per day.  What types of fat should I choose?  Choose healthy fats more often. Choose monounsaturated and polyunsaturated fats, such as olive oil and canola oil, flaxseeds, walnuts, almonds, and seeds.  Eat more omega-3 fats. Good choices include salmon, mackerel, sardines, tuna, flaxseed oil, and ground flaxseeds. Aim to eat fish at least two times each week.  Limit saturated fats. Saturated fats are primarily found in animal products, such as meats, butter, and cream. Plant sources of saturated fats include palm oil, palm kernel oil, and coconut oil.  Avoid foods with partially hydrogenated oils in them. These contain trans fats. Examples of foods that contain trans fats are stick margarine, some tub margarines, cookies, crackers, and other baked goods. What general guidelines do I need to follow?  Check food labels carefully to identify foods with trans fats or high amounts of saturated fat.  Fill one half of your plate with vegetables and green salads. Eat 4-5 servings of vegetables per day. A serving of vegetables equals 1 cup of raw leafy vegetables,  cup of raw or cooked cut-up vegetables, or  cup of vegetable juice.  Fill one fourth of your plate with whole grains. Look for the word "whole"  as the first word in the ingredient list.  Fill one fourth of your plate with lean protein foods.  Eat 4-5 servings of fruit per day. A serving of fruit equals one medium whole fruit,  cup of dried fruit,  cup of fresh, frozen, or canned fruit, or  cup of 100% fruit juice.  Eat more foods that contain soluble fiber. Examples of foods that contain this type of fiber are apples, broccoli, carrots, beans, peas, and barley. Aim to get 20-30 g of fiber per day.  Eat more home-cooked food and less restaurant, buffet, and fast food.  Limit or avoid alcohol.  Limit foods that are high in starch and sugar.  Avoid fried foods.  Cook foods by using methods other than frying. Baking, boiling, grilling, and broiling are all great options. Other fat-reducing suggestions include: ? Removing the skin from poultry. ? Removing all visible fats from meats. ? Skimming the fat off of stews, soups, and gravies before serving them. ? Steaming vegetables in water or broth.  Lose weight if you are overweight. Losing just 5-10% of your initial body weight can help your overall health and prevent diseases such as diabetes and heart disease.  Increase your consumption of nuts, legumes, and seeds to 4-5 servings per week. One serving of dried beans or legumes equals  cup after being cooked, one serving of nuts equals 1 ounces, and one serving of seeds equals  ounce or 1 tablespoon.  You may need to monitor your salt (sodium) intake, especially if you have high blood pressure. Talk with your health care provider or dietitian to get  more information about reducing sodium. What foods can I eat? Grains  Breads, including [place], white, pita, wheat, raisin, rye, oatmeal, and [place]. Tortillas that are neither fried nor made with lard or trans fat. Low-fat rolls, including hotdog and hamburger buns and English muffins. Biscuits. Muffins. Waffles. Pancakes. Light popcorn. Whole-grain cereals. Flatbread. Melba  toast. Pretzels. Breadsticks. Rusks. Low-fat snacks and crackers, including oyster, saltine, matzo, graham, animal, and rye. Rice and pasta, including brown rice and those that are made with whole wheat. Vegetables All vegetables. Fruits All fruits, but limit coconut. Meats and Other Protein Sources Lean, well-trimmed beef, veal, pork, and lamb. Chicken and [place] without skin. All fish and shellfish. Wild duck, rabbit, pheasant, and venison. Egg whites or low-cholesterol egg substitutes. Dried beans, peas, lentils, and tofu.Seeds and most nuts. Dairy Low-fat or nonfat cheeses, including ricotta, string, and mozzarella. Skim or 1% milk that is liquid, powdered, or evaporated. Buttermilk that is made with low-fat milk. Nonfat or low-fat yogurt. Beverages Mineral water. Diet carbonated beverages. Sweets and Desserts Sherbets and fruit ices. Honey, jam, marmalade, jelly, and syrups. Meringues and gelatins. Pure sugar candy, such as hard candy, jelly beans, gumdrops, mints, marshmallows, and small amounts of dark chocolate. [employer] Eat all sweets and desserts in moderation. Fats and Oils Nonhydrogenated (trans-free) margarines. Vegetable oils, including soybean, sesame, sunflower, olive, peanut, safflower, corn, canola, and cottonseed. Salad dressings or mayonnaise that are made with a vegetable oil. Limit added fats and oils that you use for cooking, baking, salads, and as spreads. Other Cocoa powder. Coffee and tea. All seasonings and condiments. The items listed above may not be a complete list of recommended foods or beverages. Contact your dietitian for more options. What foods are not recommended? Grains Breads that are made with saturated or trans fats, oils, or whole milk. Croissants. Butter rolls. Cheese breads. Sweet rolls. Donuts. Buttered popcorn. Chow mein noodles. High-fat crackers, such as cheese or butter crackers. Meats and Other Protein Sources Fatty meats, such as  hotdogs, short ribs, sausage, spareribs, bacon, ribeye roast or steak, and mutton. High-fat deli meats, such as salami and bologna. Caviar. Domestic duck and goose. Organ meats, such as kidney, liver, sweetbreads, brains, gizzard, chitterlings, and heart. Dairy Cream, sour cream, cream cheese, and creamed cottage cheese. Whole milk cheeses, including blue (bleu), Monterey Jack, [place], [place], [place], [place], [place], [place], [place], and [place]. Whole or 2% milk that is liquid, evaporated, or condensed. Whole buttermilk. Cream sauce or high-fat cheese sauce. Yogurt that is made from whole milk. Beverages Regular sodas and drinks with added sugar. Sweets and Desserts Frosting. Pudding. Cookies. Cakes other than angel food cake. Candy that has milk chocolate or white chocolate, hydrogenated fat, butter, coconut, or unknown ingredients. Buttered syrups. Full-fat ice cream or ice cream drinks. Fats and Oils Gravy that has suet, meat fat, or shortening. Cocoa butter, hydrogenated oils, palm oil, coconut oil, palm kernel oil. These can often be found in baked products, candy, fried foods, nondairy creamers, and whipped toppings. Solid fats and shortenings, including bacon fat, salt pork, lard, and butter. Nondairy cream substitutes, such as coffee creamers and sour cream substitutes. Salad dressings that are made of unknown oils, cheese, or sour cream. The items listed above may not be a complete list of foods and beverages to avoid. Contact your dietitian for more information. This information is not intended to replace advice given to you by your health care provider. Make sure you discuss any questions you have with your health care  provider. Document Released: 12/12/2007 Document Revised: 09/22/2015 Document Reviewed: 08/26/2013 Elsevier Interactive Patient Education  2017 [place].  Insomnia Insomnia is a sleep disorder that makes it difficult to fall asleep or to stay asleep. Insomnia can  cause tiredness (fatigue), low energy, difficulty concentrating, mood swings, and poor performance at work or school. There are three different ways to classify insomnia:  Difficulty falling asleep.  Difficulty staying asleep.  Waking up too early in the morning.  Any type of insomnia can be long-term (chronic) or short-term (acute). Both are common. Short-term insomnia usually lasts for three months or less. Chronic insomnia occurs at least three times a week for longer than three months. What are the causes? Insomnia may be caused by another condition, situation, or substance, such as:  Anxiety.  Certain medicines.  Gastroesophageal reflux disease (GERD) or other gastrointestinal conditions.  Asthma or other breathing conditions.  Restless legs syndrome, sleep apnea, or other sleep disorders.  Chronic pain.  Menopause. This may include hot flashes.  Stroke.  Abuse of alcohol, tobacco, or illegal drugs.  Depression.  Caffeine.  Neurological disorders, such as Alzheimer disease.  An overactive thyroid (hyperthyroidism).  The cause of insomnia may not be known. What increases the risk? Risk factors for insomnia include:  Gender. Women are more commonly affected than men.  Age. Insomnia is more common as you get older.  Stress. This may involve your professional or personal life.  Income. Insomnia is more common in people with lower income.  Lack of exercise.  Irregular work schedule or night shifts.  Traveling between different time zones.  What are the signs or symptoms? If you have insomnia, trouble falling asleep or trouble staying asleep is the main symptom. This may lead to other symptoms, such as:  Feeling fatigued.  Feeling nervous about going to sleep.  Not feeling rested in the morning.  Having trouble concentrating.  Feeling irritable, anxious, or depressed.  How is this treated? Treatment for insomnia depends on the cause. If your  insomnia is caused by an underlying condition, treatment will focus on addressing the condition. Treatment may also include:  Medicines to help you sleep.  Counseling or therapy.  Lifestyle adjustments.  Follow these instructions at home:  Take medicines only as directed by your health care provider.  Keep regular sleeping and waking hours. Avoid naps.  Keep a sleep diary to help you and your health care provider figure out what could be causing your insomnia. Include: ? When you sleep. ? When you wake up during the night. ? How well you sleep. ? How rested you feel the next day. ? Any side effects of medicines you are taking. ? What you eat and drink.  Make your bedroom a comfortable place where it is easy to fall asleep: ? Put up shades or special blackout curtains to block light from outside. ? Use a white noise machine to block noise. ? Keep the temperature cool.  Exercise regularly as directed by your health care provider. Avoid exercising right before bedtime.  Use relaxation techniques to manage stress. Ask your health care provider to suggest some techniques that may work well for you. These may include: ? Breathing exercises. ? Routines to release muscle tension. ? Visualizing peaceful scenes.  Cut back on alcohol, caffeinated beverages, and cigarettes, especially close to bedtime. These can disrupt your sleep.  Do not overeat or eat spicy foods right before bedtime. This can lead to digestive discomfort that can make it hard for  you to sleep.  Limit screen use before bedtime. This includes: ? Watching TV. ? Using your smartphone, tablet, and computer.  Stick to a routine. This can help you fall asleep faster. Try to do a quiet activity, brush your teeth, and go to bed at the same time each night.  Get out of bed if you are still awake after 15 minutes of trying to sleep. Keep the lights down, but try reading or doing a quiet activity. When you feel sleepy, go  back to bed.  Make sure that you drive carefully. Avoid driving if you feel very sleepy.  Keep all follow-up appointments as directed by your health care provider. This is important. Contact a health care provider if:  You are tired throughout the day or have trouble in your daily routine due to sleepiness.  You continue to have sleep problems or your sleep problems get worse. Get help right away if:  You have serious thoughts about hurting yourself or someone else. This information is not intended to replace advice given to you by your health care provider. Make sure you discuss any questions you have with your health care provider. Document Released: 03/01/2000 Document Revised: 08/04/2015 Document Reviewed: 12/03/2013 Elsevier Interactive Patient Education  2018 [employer].  Increase water intake, strive for at least 80 ounces/day.   Follow Heart Healthy diet Increase regular exercise.  Recommend at least 30 minutes daily, 5 days per week of walking, jogging, biking, swimming, YouTube/Pinterest workout videos. Please take Ambien 6.26 mg as needed for insomnia. Once daily Terbinafine 250mg  daily for toenail fungus- avoid alcohol while taking. We will re-check liver function in 3 months and evaluate effectiveness of Terbinafine therapy. Physical with fasting labs April 2019. Keep up the excellent exercise and healthy eating!!! WELCOME TO THE PRACTICE!

## 2016-12-13 DIAGNOSIS — H5213 Myopia, bilateral: Secondary | ICD-10-CM | POA: Diagnosis not present

## 2017-01-22 ENCOUNTER — Other Ambulatory Visit: Payer: Self-pay | Admitting: Adult Health

## 2017-01-27 ENCOUNTER — Other Ambulatory Visit: Payer: Self-pay | Admitting: Adult Health

## 2017-02-18 ENCOUNTER — Telehealth: Payer: Self-pay | Admitting: Obstetrics & Gynecology

## 2017-02-18 NOTE — Telephone Encounter (Signed)
Patient thinks she may need her hormones checked.  States she is having difficulty sleeping, moody and irritable, night sweats and a little depressed.

## 2017-02-18 NOTE — Telephone Encounter (Signed)
Spoke with patient, requesting OV for hormones to be checked. Reports difficulty sleeping, increased irritability and moodiness, night sweats and depression.   Tried Neurontin in May 2018, felt hung over after taking at night, still couldn't function at work next day. Has tried herbal and OTC supplements for vasomotor symptoms with no resolve.   Offered OV for today, patient declined, scheduled for 12/7 at 11:15am with Dr. Sabra Heck. Patient is agreeable to date and time.   Routing to provider for final review. Patient is agreeable to disposition. Will close encounter.

## 2017-02-21 ENCOUNTER — Ambulatory Visit: Payer: 59 | Admitting: Obstetrics & Gynecology

## 2017-02-21 ENCOUNTER — Other Ambulatory Visit: Payer: Self-pay

## 2017-02-21 ENCOUNTER — Encounter: Payer: Self-pay | Admitting: Obstetrics & Gynecology

## 2017-02-21 VITALS — BP 126/76 | HR 62 | Resp 12 | Ht 67.0 in | Wt 161.0 lb

## 2017-02-21 DIAGNOSIS — G479 Sleep disorder, unspecified: Secondary | ICD-10-CM

## 2017-02-21 DIAGNOSIS — N951 Menopausal and female climacteric states: Secondary | ICD-10-CM

## 2017-02-21 DIAGNOSIS — R4586 Emotional lability: Secondary | ICD-10-CM | POA: Diagnosis not present

## 2017-02-21 MED ORDER — BUPROPION HCL ER (XL) 150 MG PO TB24: 150.0000 mg | ORAL_TABLET | Freq: Every day | ORAL | 2 refills | Status: DC

## 2017-02-21 MED ORDER — ZOLPIDEM TARTRATE ER 6.25 MG PO TBCR: 6.2500 mg | EXTENDED_RELEASE_TABLET | Freq: Every evening | ORAL | 1 refills | Status: DC | PRN

## 2017-02-21 MED FILL — ZOLPIDEM TART ER 6.25 MG TA: 6.25 | 30 days supply | Qty: 30 | Fill #0

## 2017-02-21 MED FILL — BUPROPION HCL XL 150 MG TAB: 150 | 30 days supply | Qty: 30 | Fill #0

## 2017-02-21 NOTE — Progress Notes (Signed)
GYNECOLOGY  VISIT  CC:   Desires hormonal check  HPI: 46 y.o. G4P2 Married Caucasian female here for discussion of several issues.  Having issues with sleep.  Did try Neurontin and this help with hot flashes but didn't really help with sleep that much.  She went through a period of time where the hot flashes resolved and sleep improved.  Ambien has really helped.  Aware no long term safety data and concerns for possible cognitive dysfunction with long term use.  Feels like good sleep really helps everything.    Also feels like her mood is not as good.  Not sure if this is part of menopause.  Of course, is "snappier" when not rested.  Doesn't really want to be on HRT.  Has worked hard on weight loss.  Continues to exercise regularly.  Doesn't really want to be on medication but does want to review today.  Has tried SSRIs without success in the past.  Options reviewed.  Had normal thyroid testing in April.   GYNECOLOGIC HISTORY: No LMP recorded. Patient has had an ablation. Contraception: tubal ligation 12/12 Menopausal hormone therapy: none  Patient Active Problem List   Diagnosis Date Noted  . Insomnia 12/12/2016  . Onychomycosis 12/12/2016  . Healthcare maintenance 12/11/2016  . Arthritis 11/10/2014    Past Medical History:  Diagnosis Date  . Abnormal Pap smear   . Anxiety   . Arthritis    hands  . Depression   . Dyslipidemia with elevated low density lipoprotein (LDL) cholesterol and abnormally low high density lipoprotein cholesterol    diet controlled  . Eczema   . Hypertension    resolved - lost weight, no med  . SVD (spontaneous vaginal delivery)    x 2    Past Surgical History:  Procedure Laterality Date  . DILATATION & CURETTAGE/HYSTEROSCOPY WITH MYOSURE N/A 06/16/2015   Procedure: DILATATION & CURETTAGE/HYSTEROSCOPY WITH Novasure;  Surgeon: [name], MD;  Location: [facility];  Service: Gynecology;  Laterality: N/A;  . GYNECOLOGIC CRYOSURGERY    . IUD REMOVAL   6/12   in cx  . LAPAROSCOPIC TUBAL LIGATION  03/04/2011   Procedure: LAPAROSCOPIC TUBAL LIGATION;  Surgeon: Felipa Emory;  Location: [facility];  Service: Gynecology;  Laterality: Bilateral;  with filshie clips  . TUBAL LIGATION  03/04/2011   Procedure: ESSURE TUBAL STERILIZATION;  Surgeon: Felipa Emory;  Location: [facility];  Service: Gynecology;  Laterality: Bilateral;  Attempted  . WISDOM TOOTH EXTRACTION      MEDS:   Current Outpatient Medications on File Prior to Visit  Medication Sig Dispense Refill  . 5-HTP CAPS Take by mouth daily.    Marland Kitchen acetaminophen (TYLENOL) 325 MG tablet Take 650 mg by mouth every 6 (six) hours as needed for mild pain or headache.    . BL EVENING PRIMROSE OIL PO Take 1 capsule by mouth 2 (two) times daily. Reported on 06/05/2015    . BLACK COHOSH PO Take 1 tablet by mouth 2 (two) times daily. Reported on 06/05/2015    . DHA-EPA-Vitamin E (OMEGA-3 COMPLEX) 192-251-11 MG-MG-UNIT CAPS Take 2 capsules by mouth 2 (two) times daily. Reported on 06/05/2015    . L-THEANINE PO Take by mouth daily.    . Multiple Vitamins-Minerals (MULTIVITAMINS THER. W/MINERALS) TABS Take 1 tablet by mouth daily. Reported on 06/05/2015    . Nutritional Supplements (CATALYTIC FORMULA) TABS Take 1 tablet by mouth daily. Reported on 06/05/2015    . nystatin cream (MYCOSTATIN) Apply 1  application topically 2 (two) times daily. Apply to affected area BID for up to 7 days. 30 g 1  . Safflower Oil (CLA) 1000 MG CAPS     . terbinafine (LAMISIL) 250 MG tablet Take 1 tablet (250 mg total) by mouth daily. 90 tablet 0  . triamcinolone cream (KENALOG) 0.5 % Apply 1 application topically 3 (three) times daily. 30 g 0  . vitamin B-12 (CYANOCOBALAMIN) 100 MCG tablet Take 100 mcg by mouth daily. Reported on 06/05/2015    . zolpidem (AMBIEN CR) 6.25 MG CR tablet TAKE 1 TABLET BY MOUTH AT BEDTIME AS NEEDED FOR SLEEP 30 tablet 0   No current facility-administered medications on file prior to visit.      ALLERGIES: Patient has no known allergies.  Family History  Problem Relation Age of Onset  . Hypertension Father   . Hyperlipidemia Father   . Thyroid disease Mother   . Hypertension Sister   . Hyperlipidemia Sister   . Healthy Sister   . Healthy Daughter   . Healthy Son     SH:  Married, non smoker  Review of Systems  Respiratory: Negative.   Cardiovascular: Negative.   Gastrointestinal: Negative.   Genitourinary: Negative.   Psychiatric/Behavioral: Negative for depression, hallucinations, memory loss, substance abuse and suicidal ideas. The patient has insomnia. The patient is not nervous/anxious.     PHYSICAL EXAMINATION:    BP 126/76 (BP Location: Right Arm, Patient Position: Sitting, Cuff Size: Normal)   Pulse 62   Resp 12   Ht 5\' 7"  (1.702 m)   Wt 161 lb (73 kg)   BMI 25.22 kg/m     General appearance: alert, cooperative and appears stated age CV:  Regular rate and rhythm Lungs:  clear to auscultation, no wheezes, rales or rhonchi, symmetric air entry  Chaperone was present for exam.  Assessment: Insomnia Mood lability  Plan: Trial of Wellbutrin XL 150mg  daily.  #30/2RF. Ambien 6.25mg  daily.  #30/1RF Will refer to Dr. Brett Fairy for evaluation of sleep issues.  Pt aware long term sleep medication would not be my recommendation for her so I'm wondering if Dr. Brett Fairy can give any other suggestions.  Pt comfortable with referral at this time. Recheck 3 months.     ~20 minutes spent with patient >50% of time was in face to face discussion of above.

## 2017-03-23 NOTE — Progress Notes (Signed)
Subjective:    Patient ID: Angela Wells, female    DOB: January 07, 1971, 47 y.o.   MRN: [id number]  HPI:  12/12/16 OV: Angela Wells is here to establish as a new pt.  She is a very pleasant 47 year old female.  PMH: HTN, HL, Anxiety, Depression, Arthritis, insomnia, and left foot toenail fungus.  She has lost >53 lbs over the last 3 years with regular exercise (cardio and wt training) and macro eating plan- GREAT JOB!  She would still like to loss another 10-15 lbs.  She drinks >80 ounces water/day and avoid tobacco use.  She reports maybe drinking 3-6 beers/month.  She has been struggling with insomnia (early waking) for years and feels that it has worsened since she began menopause.  She has tries various OTC remedies without success.  She also complains of "ugel toenails on my left foot", present since 2015.  She is marries and has children.  Last physical/labs April 2018, unremarkable with exception of HL that she is using TLC to normalize.  03/24/17 OV: Angela Wells is here for f/u insomnia and onychomycosis of toenails. She has been on Terbinafine for 12 weeks and reports at least 80% reduction of sx's. She continues to exercise 4 days a week-HITT and wt training. She was recently seen by her OB/GYN and has a sleep study schedule at end of month, that provider refilled her zolpidem 6.25mg  and she reports being able fall asleep but still wakes early during a "hot flash". She is not interested in hormone replacement therapy. She developed intermittent R hip pain >2 months ago.  Pain 4/10, worsens with flexion.  She denies acute injury/trauma prior to on set of sx's. She denies numbness/tingling in R foot. She estimates to drink 40-50 oz water day and continues to avoid ETOH.  Patient Care Team    Relationship Specialty Notifications Start End  Esaw Grandchild, NP PCP - General Family Medicine  12/03/16     Patient Active Problem List   Diagnosis Date Noted  . Acute right hip pain 03/24/2017  .  Dyslipidemia with elevated low density lipoprotein (LDL) cholesterol and abnormally low high density lipoprotein cholesterol 03/24/2017  . Insomnia 12/12/2016  . Onychomycosis 12/12/2016  . Healthcare maintenance 12/11/2016  . Arthritis 11/10/2014     Past Medical History:  Diagnosis Date  . Abnormal Pap smear   . Anxiety   . Arthritis    hands  . Depression   . Dyslipidemia with elevated low density lipoprotein (LDL) cholesterol and abnormally low high density lipoprotein cholesterol    diet controlled  . Eczema   . Hypertension    resolved - lost weight, no med  . SVD (spontaneous vaginal delivery)    x 2     Past Surgical History:  Procedure Laterality Date  . DILATATION & CURETTAGE/HYSTEROSCOPY WITH MYOSURE N/A 06/16/2015   Procedure: DILATATION & CURETTAGE/HYSTEROSCOPY WITH Novasure;  Surgeon: [name], MD;  Location: [facility];  Service: Gynecology;  Laterality: N/A;  . GYNECOLOGIC CRYOSURGERY    . IUD REMOVAL  6/12   in cx  . LAPAROSCOPIC TUBAL LIGATION  03/04/2011   Procedure: LAPAROSCOPIC TUBAL LIGATION;  Surgeon: Felipa Emory;  Location: [facility];  Service: Gynecology;  Laterality: Bilateral;  with filshie clips  . TUBAL LIGATION  03/04/2011   Procedure: ESSURE TUBAL STERILIZATION;  Surgeon: Felipa Emory;  Location: [facility];  Service: Gynecology;  Laterality: Bilateral;  Attempted  . WISDOM TOOTH EXTRACTION  Family History  Problem Relation Age of Onset  . Hypertension Father   . Hyperlipidemia Father   . Thyroid disease Mother   . Hypertension Sister   . Hyperlipidemia Sister   . Healthy Sister   . Healthy Daughter   . Healthy Son      Social History   Substance and Sexual Activity  Drug Use No     Social History   Substance and Sexual Activity  Alcohol Use Yes  . Alcohol/week: 0.0 oz   Comment: socially beer     Social History   Tobacco Use  Smoking Status Former Smoker  . Packs/day: 0.25  . Years: 13.00  . Pack years:  3.25  . Types: Cigarettes  . Last attempt to quit: 03/18/1998  . Years since quitting: 19.0  Smokeless Tobacco Never Used     Outpatient Encounter Medications as of 03/24/2017  Medication Sig  . 5-HTP CAPS Take by mouth daily.  Marland Kitchen acetaminophen (TYLENOL) 325 MG tablet Take 650 mg by mouth every 6 (six) hours as needed for mild pain or headache.  . BL EVENING PRIMROSE OIL PO Take 1 capsule by mouth 2 (two) times daily. Reported on 06/05/2015  . BLACK COHOSH PO Take 1 tablet by mouth 2 (two) times daily. Reported on 06/05/2015  . DHA-EPA-Vitamin E (OMEGA-3 COMPLEX) 192-251-11 MG-MG-UNIT CAPS Take 2 capsules by mouth 2 (two) times daily. Reported on 06/05/2015  . L-THEANINE PO Take by mouth daily.  . Multiple Vitamins-Minerals (MULTIVITAMINS THER. W/MINERALS) TABS Take 1 tablet by mouth daily. Reported on 06/05/2015  . Nutritional Supplements (CATALYTIC FORMULA) TABS Take 1 tablet by mouth daily. Reported on 06/05/2015  . Safflower Oil (CLA) 1000 MG CAPS   . terbinafine (LAMISIL) 250 MG tablet Take 1 tablet (250 mg total) by mouth daily.  . vitamin B-12 (CYANOCOBALAMIN) 100 MCG tablet Take 100 mcg by mouth daily. Reported on 06/05/2015  . zolpidem (AMBIEN CR) 6.25 MG CR tablet Take 1 tablet (6.25 mg total) by mouth at bedtime as needed. for sleep  . [DISCONTINUED] buPROPion (WELLBUTRIN XL) 150 MG 24 hr tablet Take 1 tablet (150 mg total) by mouth daily.  . [DISCONTINUED] nystatin cream (MYCOSTATIN) Apply 1 application topically 2 (two) times daily. Apply to affected area BID for up to 7 days.  . [DISCONTINUED] triamcinolone cream (KENALOG) 0.5 % Apply 1 application topically 3 (three) times daily.   No facility-administered encounter medications on file as of 03/24/2017.     Allergies: Patient has no known allergies.  Body mass index is 25.08 kg/m.  Blood pressure 135/79, pulse 61, height 5\' 7"  (1.702 m), weight 160 lb 1.6 oz (72.6 kg), SpO2 100 %.      Review of Systems  Constitutional:  Positive for fatigue. Negative for activity change, appetite change, chills, diaphoresis, fever and unexpected weight change.  HENT: Negative for congestion.   Eyes: Negative for visual disturbance.  Respiratory: Negative for cough, chest tightness, shortness of breath, wheezing and stridor.   Cardiovascular: Negative for chest pain, palpitations and leg swelling.  Gastrointestinal: Negative for abdominal distention, abdominal pain, blood in stool, constipation, diarrhea, nausea and vomiting.  Endocrine: Negative for cold intolerance, heat intolerance, polydipsia, polyphagia and polyuria.  Genitourinary: Negative for difficulty urinating, dysuria, flank pain, frequency and hematuria.  Musculoskeletal: Negative for arthralgias, back pain, gait problem, joint swelling, myalgias, neck pain and neck stiffness.  Skin: Positive for rash. Negative for color change, pallor and wound.       Toenail fungus L foot  2/4th toes- 80% reduction since initial assessment 9/18  Neurological: Negative for dizziness, tremors, weakness and headaches.  Hematological: Does not bruise/bleed easily.  Psychiatric/Behavioral: Positive for sleep disturbance. Negative for confusion, decreased concentration, self-injury and suicidal ideas. The patient is not nervous/anxious.        Objective:   Physical Exam  Constitutional: She is oriented to person, place, and time. She appears well-developed and well-nourished. No distress.  HENT:  Head: Normocephalic and atraumatic.  Right Ear: External ear normal.  Left Ear: External ear normal.  Cardiovascular: Regular rhythm, normal heart sounds and intact distal pulses. Bradycardia present.  No murmur heard. Well conditioned cardiovascular system  Pulmonary/Chest: Effort normal and breath sounds normal. No respiratory distress. She has no wheezes. She has no rales. She exhibits no tenderness.  Musculoskeletal: Normal range of motion. She exhibits tenderness. She exhibits no  edema.       Right hip: She exhibits tenderness and crepitus. She exhibits normal range of motion and normal strength.       Left hip: Normal.       Right knee: Normal.  Neurological: She is alert and oriented to person, place, and time.  Skin: Skin is warm and dry. She is not diaphoretic.  Onychomycosis on L foot 2/3 toes- almost completely resolved  Psychiatric: She has a normal mood and affect. Her behavior is normal. Judgment and thought content normal.  Nursing note and vitals reviewed.         Assessment & Plan:   1. Onychomycosis   2. Insomnia, unspecified type   3. Acute right hip pain   4. Healthcare maintenance   5. Dyslipidemia with elevated low density lipoprotein (LDL) cholesterol and abnormally low high density lipoprotein cholesterol     Healthcare maintenance Increase water intake, strive for at least 80 ounces/day.   Follow Heart Healthy diet Continue regular exercise.  Recommend at least 30 minutes daily, 5 days per week of walking, jogging, biking, swimming, YouTube/Pinterest workout videos. Please keep sleep study appt at end of month. If you need refill on Ambien, please call clinic. Please schedule full physical with fasting labs in 4 months.  Acute right hip pain PT referral placed for right hip pain.  Onychomycosis 12 weeks is recommended course of Lamisil- complete what you have then use Tea Tree Oil on Left toenails.    Insomnia Please keep sleep study appt at end of month. If you need refill on Ambien, please call clinic. Practice Sleep Hygiene.  Dyslipidemia with elevated low density lipoprotein (LDL) cholesterol and abnormally low high density lipoprotein cholesterol Will check lipid panel at CPE in 4 months    FOLLOW-UP:  Return in about 4 months (around 07/22/2017) for CPE, Fasting Labs.

## 2017-03-24 ENCOUNTER — Ambulatory Visit (INDEPENDENT_AMBULATORY_CARE_PROVIDER_SITE_OTHER): Payer: 59 | Admitting: Adult Health

## 2017-03-24 ENCOUNTER — Other Ambulatory Visit: Payer: Self-pay | Admitting: Adult Health

## 2017-03-24 ENCOUNTER — Encounter: Payer: Self-pay | Admitting: Adult Health

## 2017-03-24 VITALS — BP 135/79 | HR 61 | Ht 67.0 in | Wt 160.1 lb

## 2017-03-24 DIAGNOSIS — Z79899 Other long term (current) drug therapy: Secondary | ICD-10-CM | POA: Diagnosis not present

## 2017-03-24 DIAGNOSIS — M25551 Pain in right hip: Secondary | ICD-10-CM | POA: Diagnosis not present

## 2017-03-24 DIAGNOSIS — E785 Hyperlipidemia, unspecified: Secondary | ICD-10-CM

## 2017-03-24 DIAGNOSIS — B351 Tinea unguium: Secondary | ICD-10-CM

## 2017-03-24 DIAGNOSIS — Z0000 Encounter for general adult medical examination without abnormal findings: Secondary | ICD-10-CM

## 2017-03-24 DIAGNOSIS — G4700 Insomnia, unspecified: Secondary | ICD-10-CM

## 2017-03-24 NOTE — Assessment & Plan Note (Signed)
12 weeks is recommended course of Lamisil- complete what you have then use Tea Tree Oil on Left toenails.

## 2017-03-24 NOTE — Assessment & Plan Note (Signed)
Increase water intake, strive for at least 80 ounces/day.   Follow Heart Healthy diet Continue regular exercise.  Recommend at least 30 minutes daily, 5 days per week of walking, jogging, biking, swimming, YouTube/Pinterest workout videos. Please keep sleep study appt at end of month. If you need refill on Ambien, please call clinic. Please schedule full physical with fasting labs in 4 months.

## 2017-03-24 NOTE — Assessment & Plan Note (Signed)
Please keep sleep study appt at end of month. If you need refill on Ambien, please call clinic. Practice Sleep Hygiene.

## 2017-03-24 NOTE — Assessment & Plan Note (Signed)
Will check lipid panel at CPE in 4 months

## 2017-03-24 NOTE — Patient Instructions (Addendum)
Heart-Healthy Eating Plan Many factors influence your heart health, including eating and exercise habits. Heart (coronary) risk increases with abnormal blood fat (lipid) levels. Heart-healthy meal planning includes limiting unhealthy fats, increasing healthy fats, and making other small dietary changes. This includes maintaining a healthy body weight to help keep lipid levels within a normal range. What is my plan? Your health care provider recommends that you:  Get no more than __25__% of the total calories in your daily diet from fat.  Limit your intake of saturated fat to less than ____5___% of your total calories each day.  Limit the amount of cholesterol in your diet to less than __300__ mg per day.  What types of fat should I choose?  Choose healthy fats more often. Choose monounsaturated and polyunsaturated fats, such as olive oil and canola oil, flaxseeds, walnuts, almonds, and seeds.  Eat more omega-3 fats. Good choices include salmon, mackerel, sardines, tuna, flaxseed oil, and ground flaxseeds. Aim to eat fish at least two times each week.  Limit saturated fats. Saturated fats are primarily found in animal products, such as meats, butter, and cream. Plant sources of saturated fats include palm oil, palm kernel oil, and coconut oil.  Avoid foods with partially hydrogenated oils in them. These contain trans fats. Examples of foods that contain trans fats are stick margarine, some tub margarines, cookies, crackers, and other baked goods. What general guidelines do I need to follow?  Check food labels carefully to identify foods with trans fats or high amounts of saturated fat.  Fill one half of your plate with vegetables and green salads. Eat 4-5 servings of vegetables per day. A serving of vegetables equals 1 cup of raw leafy vegetables,  cup of raw or cooked cut-up vegetables, or  cup of vegetable juice.  Fill one fourth of your plate with whole grains. Look for the word "whole"  as the first word in the ingredient list.  Fill one fourth of your plate with lean protein foods.  Eat 4-5 servings of fruit per day. A serving of fruit equals one medium whole fruit,  cup of dried fruit,  cup of fresh, frozen, or canned fruit, or  cup of 100% fruit juice.  Eat more foods that contain soluble fiber. Examples of foods that contain this type of fiber are apples, broccoli, carrots, beans, peas, and barley. Aim to get 20-30 g of fiber per day.  Eat more home-cooked food and less restaurant, buffet, and fast food.  Limit or avoid alcohol.  Limit foods that are high in starch and sugar.  Avoid fried foods.  Cook foods by using methods other than frying. Baking, boiling, grilling, and broiling are all great options. Other fat-reducing suggestions include: ? Removing the skin from poultry. ? Removing all visible fats from meats. ? Skimming the fat off of stews, soups, and gravies before serving them. ? Steaming vegetables in water or broth.  Lose weight if you are overweight. Losing just 5-10% of your initial body weight can help your overall health and prevent diseases such as diabetes and heart disease.  Increase your consumption of nuts, legumes, and seeds to 4-5 servings per week. One serving of dried beans or legumes equals  cup after being cooked, one serving of nuts equals 1 ounces, and one serving of seeds equals  ounce or 1 tablespoon.  You may need to monitor your salt (sodium) intake, especially if you have high blood pressure. Talk with your health care provider or dietitian to get  more information about reducing sodium. What foods can I eat? Grains  Breads, including [place], white, pita, wheat, raisin, rye, oatmeal, and [place]. Tortillas that are neither fried nor made with lard or trans fat. Low-fat rolls, including hotdog and hamburger buns and English muffins. Biscuits. Muffins. Waffles. Pancakes. Light popcorn. Whole-grain cereals. Flatbread. Melba  toast. Pretzels. Breadsticks. Rusks. Low-fat snacks and crackers, including oyster, saltine, matzo, graham, animal, and rye. Rice and pasta, including brown rice and those that are made with whole wheat. Vegetables All vegetables. Fruits All fruits, but limit coconut. Meats and Other Protein Sources Lean, well-trimmed beef, veal, pork, and lamb. Chicken and [place] without skin. All fish and shellfish. Wild duck, rabbit, pheasant, and venison. Egg whites or low-cholesterol egg substitutes. Dried beans, peas, lentils, and tofu.Seeds and most nuts. Dairy Low-fat or nonfat cheeses, including ricotta, string, and mozzarella. Skim or 1% milk that is liquid, powdered, or evaporated. Buttermilk that is made with low-fat milk. Nonfat or low-fat yogurt. Beverages Mineral water. Diet carbonated beverages. Sweets and Desserts Sherbets and fruit ices. Honey, jam, marmalade, jelly, and syrups. Meringues and gelatins. Pure sugar candy, such as hard candy, jelly beans, gumdrops, mints, marshmallows, and small amounts of dark chocolate. [employer] Eat all sweets and desserts in moderation. Fats and Oils Nonhydrogenated (trans-free) margarines. Vegetable oils, including soybean, sesame, sunflower, olive, peanut, safflower, corn, canola, and cottonseed. Salad dressings or mayonnaise that are made with a vegetable oil. Limit added fats and oils that you use for cooking, baking, salads, and as spreads. Other Cocoa powder. Coffee and tea. All seasonings and condiments. The items listed above may not be a complete list of recommended foods or beverages. Contact your dietitian for more options. What foods are not recommended? Grains Breads that are made with saturated or trans fats, oils, or whole milk. Croissants. Butter rolls. Cheese breads. Sweet rolls. Donuts. Buttered popcorn. Chow mein noodles. High-fat crackers, such as cheese or butter crackers. Meats and Other Protein Sources Fatty meats, such as  hotdogs, short ribs, sausage, spareribs, bacon, ribeye roast or steak, and mutton. High-fat deli meats, such as salami and bologna. Caviar. Domestic duck and goose. Organ meats, such as kidney, liver, sweetbreads, brains, gizzard, chitterlings, and heart. Dairy Cream, sour cream, cream cheese, and creamed cottage cheese. Whole milk cheeses, including blue (bleu), Monterey Jack, [place], [place], American, [place], Swiss, [place], [place], and [place]. Whole or 2% milk that is liquid, evaporated, or condensed. Whole buttermilk. Cream sauce or high-fat cheese sauce. Yogurt that is made from whole milk. Beverages Regular sodas and drinks with added sugar. Sweets and Desserts Frosting. Pudding. Cookies. Cakes other than angel food cake. Candy that has milk chocolate or white chocolate, hydrogenated fat, butter, coconut, or unknown ingredients. Buttered syrups. Full-fat ice cream or ice cream drinks. Fats and Oils Gravy that has suet, meat fat, or shortening. Cocoa butter, hydrogenated oils, palm oil, coconut oil, palm kernel oil. These can often be found in baked products, candy, fried foods, nondairy creamers, and whipped toppings. Solid fats and shortenings, including bacon fat, salt pork, lard, and butter. Nondairy cream substitutes, such as coffee creamers and sour cream substitutes. Salad dressings that are made of unknown oils, cheese, or sour cream. The items listed above may not be a complete list of foods and beverages to avoid. Contact your dietitian for more information. This information is not intended to replace advice given to you by your health care provider. Make sure you discuss any questions you have with your health care  provider. Document Released: 12/12/2007 Document Revised: 09/22/2015 Document Reviewed: 08/26/2013 Elsevier Interactive Patient Education  2018 [employer].  Insomnia Insomnia is a sleep disorder that makes it difficult to fall asleep or to stay asleep. Insomnia can  cause tiredness (fatigue), low energy, difficulty concentrating, mood swings, and poor performance at work or school. There are three different ways to classify insomnia:  Difficulty falling asleep.  Difficulty staying asleep.  Waking up too early in the morning.  Any type of insomnia can be long-term (chronic) or short-term (acute). Both are common. Short-term insomnia usually lasts for three months or less. Chronic insomnia occurs at least three times a week for longer than three months. What are the causes? Insomnia may be caused by another condition, situation, or substance, such as:  Anxiety.  Certain medicines.  Gastroesophageal reflux disease (GERD) or other gastrointestinal conditions.  Asthma or other breathing conditions.  Restless legs syndrome, sleep apnea, or other sleep disorders.  Chronic pain.  Menopause. This may include hot flashes.  Stroke.  Abuse of alcohol, tobacco, or illegal drugs.  Depression.  Caffeine.  Neurological disorders, such as Alzheimer disease.  An overactive thyroid (hyperthyroidism).  The cause of insomnia may not be known. What increases the risk? Risk factors for insomnia include:  Gender. Women are more commonly affected than men.  Age. Insomnia is more common as you get older.  Stress. This may involve your professional or personal life.  Income. Insomnia is more common in people with lower income.  Lack of exercise.  Irregular work schedule or night shifts.  Traveling between different time zones.  What are the signs or symptoms? If you have insomnia, trouble falling asleep or trouble staying asleep is the main symptom. This may lead to other symptoms, such as:  Feeling fatigued.  Feeling nervous about going to sleep.  Not feeling rested in the morning.  Having trouble concentrating.  Feeling irritable, anxious, or depressed.  How is this treated? Treatment for insomnia depends on the cause. If your  insomnia is caused by an underlying condition, treatment will focus on addressing the condition. Treatment may also include:  Medicines to help you sleep.  Counseling or therapy.  Lifestyle adjustments.  Follow these instructions at home:  Take medicines only as directed by your health care provider.  Keep regular sleeping and waking hours. Avoid naps.  Keep a sleep diary to help you and your health care provider figure out what could be causing your insomnia. Include: ? When you sleep. ? When you wake up during the night. ? How well you sleep. ? How rested you feel the next day. ? Any side effects of medicines you are taking. ? What you eat and drink.  Make your bedroom a comfortable place where it is easy to fall asleep: ? Put up shades or special blackout curtains to block light from outside. ? Use a white noise machine to block noise. ? Keep the temperature cool.  Exercise regularly as directed by your health care provider. Avoid exercising right before bedtime.  Use relaxation techniques to manage stress. Ask your health care provider to suggest some techniques that may work well for you. These may include: ? Breathing exercises. ? Routines to release muscle tension. ? Visualizing peaceful scenes.  Cut back on alcohol, caffeinated beverages, and cigarettes, especially close to bedtime. These can disrupt your sleep.  Do not overeat or eat spicy foods right before bedtime. This can lead to digestive discomfort that can make it hard for  you to sleep.  Limit screen use before bedtime. This includes: ? Watching TV. ? Using your smartphone, tablet, and computer.  Stick to a routine. This can help you fall asleep faster. Try to do a quiet activity, brush your teeth, and go to bed at the same time each night.  Get out of bed if you are still awake after 15 minutes of trying to sleep. Keep the lights down, but try reading or doing a quiet activity. When you feel sleepy, go  back to bed.  Make sure that you drive carefully. Avoid driving if you feel very sleepy.  Keep all follow-up appointments as directed by your health care provider. This is important. Contact a health care provider if:  You are tired throughout the day or have trouble in your daily routine due to sleepiness.  You continue to have sleep problems or your sleep problems get worse. Get help right away if:  You have serious thoughts about hurting yourself or someone else. This information is not intended to replace advice given to you by your health care provider. Make sure you discuss any questions you have with your health care provider. Document Released: 03/01/2000 Document Revised: 08/04/2015 Document Reviewed: 12/03/2013 Elsevier Interactive Patient Education  2018 [place].  Neck Exercises Neck exercises can be important for many reasons:  They can help you to improve and maintain flexibility in your neck. This can be especially important as you age.  They can help to make your neck stronger. This can make movement easier.  They can reduce or prevent neck pain.  They may help your upper back.  Ask your health care provider which neck exercises would be best for you. Exercises Neck Press Repeat this exercise 10 times. Do it first thing in the morning and right before bed or as told by your health care provider. 1. Lie on your back on a firm bed or on the floor with a pillow under your head. 2. Use your neck muscles to push your head down on the pillow and straighten your spine. 3. Hold the position as well as you can. Keep your head facing up and your chin tucked. 4. Slowly count to 5 while holding this position. 5. Relax for a few seconds. Then repeat.  Isometric Strengthening Do a full set of these exercises 2 times a day or as told by your health care provider. 1. Sit in a supportive chair and place your hand on your forehead. 2. Push forward with your head and neck  while pushing back with your hand. Hold for 10 seconds. 3. Relax. Then repeat the exercise 3 times. 4. Next, do thesequence again, this time putting your hand against the back of your head. Use your head and neck to push backward against the hand pressure. 5. Finally, do the same exercise on either side of your head, pushing sideways against the pressure of your hand.  Prone Head Lifts Repeat this exercise 5 times. Do this 2 times a day or as told by your health care provider. 1. Lie face-down, resting on your elbows so that your chest and upper back are raised. 2. Start with your head facing downward, near your chest. Position your chin either on or near your chest. 3. Slowly lift your head upward. Lift until you are looking straight ahead. Then continue lifting your head as far back as you can stretch. 4. Hold your head up for 5 seconds. Then slowly lower it to your starting position.  Supine Head Lifts Repeat this exercise 8-10 times. Do this 2 times a day or as told by your health care provider. 1. Lie on your back, bending your knees to point to the ceiling and keeping your feet flat on the floor. 2. Lift your head slowly off the floor, raising your chin toward your chest. 3. Hold for 5 seconds. 4. Relax and repeat.  Scapular Retraction Repeat this exercise 5 times. Do this 2 times a day or as told by your health care provider. 1. Stand with your arms at your sides. Look straight ahead. 2. Slowly pull both shoulders backward and downward until you feel a stretch between your shoulder blades in your upper back. 3. Hold for 10-30 seconds. 4. Relax and repeat.  Contact a health care provider if:  Your neck pain or discomfort gets much worse when you do an exercise.  Your neck pain or discomfort does not improve within 2 hours after you exercise. If you have any of these problems, stop exercising right away. Do not do the exercises again unless your health care provider says that you  can. Get help right away if:  You develop sudden, severe neck pain. If this happens, stop exercising right away. Do not do the exercises again unless your health care provider says that you can. Exercises Neck Stretch  Repeat this exercise 3-5 times. 1. Do this exercise while standing or while sitting in a chair. 2. Place your feet flat on the floor, shoulder-width apart. 3. Slowly turn your head to the right. Turn it all the way to the right so you can look over your right shoulder. Do not tilt or tip your head. 4. Hold this position for 10-30 seconds. 5. Slowly turn your head to the left, to look over your left shoulder. 6. Hold this position for 10-30 seconds.  Neck Retraction Repeat this exercise 8-10 times. Do this 3-4 times a day or as told by your health care provider. 1. Do this exercise while standing or while sitting in a sturdy chair. 2. Look straight ahead. Do not bend your neck. 3. Use your fingers to push your chin backward. Do not bend your neck for this movement. Continue to face straight ahead. If you are doing the exercise properly, you will feel a slight sensation in your throat and a stretch at the back of your neck. 4. Hold the stretch for 1-2 seconds. Relax and repeat.  This information is not intended to replace advice given to you by your health care provider. Make sure you discuss any questions you have with your health care provider. Document Released: 02/13/2015 Document Revised: 08/10/2015 Document Reviewed: 09/12/2014 Elsevier Interactive Patient Education  [employer].   12 weeks is recommended course of Lamisil- complete what you have then use Tea Tree Oil on Left toenails.   PT referral placed for right hip pain. Increase water intake, strive for at least 80 ounces/day.   Follow Heart Healthy diet Continue regular exercise.  Recommend at least 30 minutes daily, 5 days per week of walking, jogging, biking, swimming, YouTube/Pinterest workout  videos. Please keep sleep study appt at end of month. If you need refill on Ambien, please call clinic. Please schedule full physical with fasting labs in 4 months. NICE TO SEE YOU!

## 2017-03-24 NOTE — Assessment & Plan Note (Signed)
PT referral placed for right hip pain.

## 2017-03-25 ENCOUNTER — Encounter: Payer: Self-pay | Admitting: Obstetrics & Gynecology

## 2017-03-25 ENCOUNTER — Encounter: Payer: Self-pay | Admitting: Adult Health

## 2017-03-25 ENCOUNTER — Telehealth: Payer: Self-pay | Admitting: Obstetrics & Gynecology

## 2017-03-25 NOTE — Telephone Encounter (Signed)
MyChart message  Dr. Sabra Heck how do you feel about Bioidentical Hormone Replacement from a compound pharmacy like [facility].

## 2017-03-25 NOTE — Telephone Encounter (Signed)
Spoke with patient. Patient states that she is miserable due to hot flashes, not sleeping well, and emotional changes. States she has tried a lot of OTC products for menopausal symptoms without relief. Asking about bio identical compounded hormones. "A nurse I work with used these and said they worked wonders." Advised we do not prescribe bio identical hormones. Do prescribe certain compounded estrogens for certain needs, but this is not the same. Patient has fears about using HRT due to cancer risk. Asking if there is another herbal or plant based medication she can try. "I am just miserable."

## 2017-03-28 NOTE — Telephone Encounter (Signed)
Left a detailed message for pt regarding bio-identical hormones and my recommendations for starting HRT.  Asked pt to call back or to send mychart message regarding desires.

## 2017-03-31 ENCOUNTER — Encounter: Payer: Self-pay | Admitting: Obstetrics & Gynecology

## 2017-03-31 ENCOUNTER — Other Ambulatory Visit: Payer: Self-pay | Admitting: Obstetrics & Gynecology

## 2017-03-31 MED ORDER — ESTRADIOL-NORETHINDRONE ACET 0.05-0.25 MG/DAY TD PTTW: 1.0000 | MEDICATED_PATCH | TRANSDERMAL | 0 refills | Status: DC

## 2017-03-31 NOTE — Telephone Encounter (Signed)
-----   Message from [place], Generic sent at 03/31/2017 3:08 PM EST -----    Hi Dr.Miller,  Thank you so much for calling me in Friday. Yes, i would like to try the hormone patch. Please call it to [facility].    Thanks for all that you do for me!!  Webb Silversmith

## 2017-03-31 NOTE — Telephone Encounter (Signed)
Routing to Dr. Sabra Heck, please advise on HRT.

## 2017-03-31 NOTE — Telephone Encounter (Signed)
I did a prescription for combipatch 0.05/0.25mg .  She will use this twice weekly.  Needs recheck 1 month after starting.

## 2017-04-01 NOTE — Telephone Encounter (Signed)
Spoke with patient, advised as seen below per Dr. Sabra Heck. OV scheduled for 05/02/17 at 2:30pm. Patient verbalizes understanding and is agreeable. Will close encounter.

## 2017-04-02 ENCOUNTER — Telehealth: Payer: Self-pay | Admitting: Obstetrics & Gynecology

## 2017-04-02 MED FILL — ZOLPIDEM TART ER 6.25 MG TA: 6.25 | 30 days supply | Qty: 30 | Fill #0

## 2017-04-02 NOTE — Telephone Encounter (Signed)
[facility] calling to speak with nurse about patient's combipatch prescription and possibly switching it to something else. Pharmacy number [id number].

## 2017-04-03 ENCOUNTER — Encounter: Payer: Self-pay | Admitting: Obstetrics & Gynecology

## 2017-04-03 NOTE — Telephone Encounter (Signed)
Spoke with Angela Wells at [facility]. Angela Wells states that the patient's Combipatch for a 3 month supply is going to be $535.11. Patient is requesting an alternative due to cost.  Routing to [place] for review and advise.

## 2017-04-03 NOTE — Telephone Encounter (Signed)
Message   ----- Message from Moro, Generic sent at 04/03/2017 12:38 PM EST -----    Good afternoon. I went to the [facility] yesterday to pick up my new Combipatch prescription and it was like $457.00 for a three month supply. I cannot afford this. Is there something in a pill form that is much more affordable?    Thank you

## 2017-04-07 ENCOUNTER — Encounter: Payer: Self-pay | Admitting: Obstetrics & Gynecology

## 2017-04-07 ENCOUNTER — Telehealth: Payer: Self-pay | Admitting: Obstetrics & Gynecology

## 2017-04-07 NOTE — Telephone Encounter (Signed)
Could start with activella HS (0.5mg /0.1mg ) daily.  Ok to send to pharmacy.  This will not be a patch but a pill and that is fine.

## 2017-04-07 NOTE — Telephone Encounter (Signed)
Please see telephone encounter dated 04/02/2017. Encounter closed.

## 2017-04-07 NOTE — Telephone Encounter (Signed)
-----   Message from Fraser, Generic sent at 04/07/2017 10:19 AM EST -----    Just checking on my message from 04/03/17. Thanks

## 2017-04-07 NOTE — Telephone Encounter (Signed)
[name]  to [name], MD      04/07/17 10:19 AM  Just checking on my message from 04/03/17. Thanks

## 2017-04-08 MED ORDER — ESTRADIOL-NORETHINDRONE ACET 0.5-0.1 MG PO TABS: 1.0000 | ORAL_TABLET | Freq: Every day | ORAL | 0 refills | Status: DC

## 2017-04-08 MED FILL — ESTRADIOL-NORETH 0.5-0.1 MG: 0.5-0.1 | 84 days supply | Qty: 84 | Fill #0

## 2017-04-08 NOTE — Telephone Encounter (Signed)
Spoke with patient. Advised of message as seen below from [place]. Patient is agreeable. Rx for Activella 0.5 mg/0.1 mg daily #90 0RF sent to pharmacy on file. Patient is agreeable. Encounter closed.

## 2017-04-16 ENCOUNTER — Ambulatory Visit (INDEPENDENT_AMBULATORY_CARE_PROVIDER_SITE_OTHER): Payer: 59 | Admitting: Neurology

## 2017-04-16 ENCOUNTER — Encounter: Payer: Self-pay | Admitting: Neurology

## 2017-04-16 ENCOUNTER — Institutional Professional Consult (permissible substitution): Payer: 59 | Admitting: Neurology

## 2017-04-16 VITALS — BP 117/69 | HR 54 | Ht 67.0 in | Wt 161.0 lb

## 2017-04-16 DIAGNOSIS — N951 Menopausal and female climacteric states: Secondary | ICD-10-CM | POA: Insufficient documentation

## 2017-04-16 MED ORDER — ZOLPIDEM TARTRATE 10 MG PO TABS: 5.0000 mg | ORAL_TABLET | Freq: Every evening | ORAL | 0 refills | Status: DC | PRN

## 2017-04-16 NOTE — Progress Notes (Signed)
SLEEP MEDICINE CLINIC   Provider:  Larey Seat, M.D.  Primary Care Physician:  Angela Grandchild, NP   Referring Provider: Esaw Grandchild, NP  and Angela Bogus, MD    Chief Complaint  Patient presents with  . New Patient (Initial Visit)    pt alone, rm 10. pt states for the last year she has been struggling with being able to go to sleep and stay asleep. the Lorrin Mais seems to keep her asleep for about 4 hours.     HPI:  Angela Wells is a 47 y.o. female , seen here in a referral from PA. Angela Wells for an evaluation of insomnia  Angela Wells reports he called his entering sleep and sustaining sleep due to menopausal symptoms.  She has not found significant relief with over-the-counter remedies such as black cohosh, evening primrose oil, or soy based phyto estrogens. She had used an IUD for menorrhagia - Dr. Gaetano Net prescribed. The IUD became dislodged, she had ablation and tubal ligation 2016.    She also reports that her energy level was not the same, she felt more irritable, and more overwhelmed than usual with balancing her obligations as a mother of 2 teenagers, a full-time job and supporting her husband. She has been prescriped a Combi patch HRT- but insurance did not cover.  Her gynecologist changed the prescription to Wild Rose which she just started and cannot yet judge as to effectively preventing symptoms or not. She tried Neurontin, Zoloft and Wellbutrin for mood. None helped.   Angela Wells reports that she used to be morbidly obese, changed her lifestyle and lost a significant amount of weight 56 pounds.  She had been on blood pressure medication but she no longer needs these.  She exercises 5 times a week, by cutting out sodas and processed foods mostly.  There was no weight loss surgery involved.  The weight loss took place over the last 3 years and initially gave her more energy, less aches and pains and her sleep was actually better. She no longer snores and has not had witnessed  apneas since.   Chief complaint according to patient : menopausal related sleep disorder  Sleep habits are as follows: The patient had advanced her bedtime to as early as about 9 PM but then started waking up early.  So over the last 3 months she has made a concerted effort to push the bedtime out to about 10 or 10:30 PM, allowing enough sleep to go to the gym at 5.  Unfortunately, she still wakes up too early after only 4 or sometimes less hours of sleep and the remaining sleep is fragmented, restless and she feels constantly hot. She has more dreams when she tried melatonin , benadryl caused PLMs,  and Ambien gives only solid four hours- but better in AM after Ambien .  There is no nocturia reported, no restless legs.  The bedroom is cool quiet and dark but right - but the temperature is not cool enough. She feels her memory foam mattress may be extra warming. Tried a cooling cover- did not work.  Rises at 5.30 to exercise, work starts at 8.30 AM and until 5 PM. 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 No naps, no sleep attacks.   Sleep medical history and family sleep history:  Mother hd early menopause, same symptoms. 2 older sisters had very few symptoms.   Social history: married, 2 teenage children.   Review of Systems:  Out of a complete 14 system review, the patient complains of only the following symptoms, and all other reviewed systems are negative. The patient can only review 1 week of hormone replacement therapy but this seems to have improved some of her symptoms in terms of mood changes.  She has not yet seen any improvement in her sleep.  Epworth score 6 , Fatigue severity score 23  , depression score n/a    Social History   Socioeconomic History  . Marital status: Married    Spouse name: Not on file  . Number of children: Not on file  . Years of education: Not on file  . Highest education level: Not on file  Social Needs  . Financial  resource strain: Not on file  . Food insecurity - worry: Not on file  . Food insecurity - inability: Not on file  . Transportation needs - medical: Not on file  . Transportation needs - non-medical: Not on file  Occupational History  . Not on file  Tobacco Use  . Smoking status: Former Smoker    Packs/day: 0.25    Years: 13.00    Pack years: 3.25    Types: Cigarettes    Last attempt to quit: 03/18/1998    Years since quitting: 19.0  . Smokeless tobacco: Never Used  Substance and Sexual Activity  . Alcohol use: Yes    Alcohol/week: 0.0 oz    Comment: socially beer  . Drug use: No  . Sexual activity: Yes    Partners: Male    Birth control/protection: Surgical    Comment: BTL/Ablation  Other Topics Concern  . Not on file  Social History Narrative  . Not on file    Family History  Problem Relation Age of Onset  . Hypertension Father   . Hyperlipidemia Father   . Thyroid disease Mother   . Hypertension Sister   . Hyperlipidemia Sister   . Healthy Sister   . Healthy Daughter   . Healthy Son     Past Medical History:  Diagnosis Date  . Abnormal Pap smear   . Anxiety   . Arthritis    hands  . Depression   . Dyslipidemia with elevated low density lipoprotein (LDL) cholesterol and abnormally low high density lipoprotein cholesterol    diet controlled  . Eczema   . Hypertension    resolved - lost weight, no med  . SVD (spontaneous vaginal delivery)    x 2    Past Surgical History:  Procedure Laterality Date  . DILATATION & CURETTAGE/HYSTEROSCOPY WITH MYOSURE N/A 06/16/2015   Procedure: DILATATION & CURETTAGE/HYSTEROSCOPY WITH Novasure;  Surgeon: [name], MD;  Location: [facility];  Service: Gynecology;  Laterality: N/A;  . GYNECOLOGIC CRYOSURGERY    . IUD REMOVAL  6/12   in cx  . LAPAROSCOPIC TUBAL LIGATION  03/04/2011   Procedure: LAPAROSCOPIC TUBAL LIGATION;  Surgeon: Felipa Emory;  Location: [facility];  Service: Gynecology;  Laterality: Bilateral;  with  filshie clips  . TUBAL LIGATION  03/04/2011   Procedure: ESSURE TUBAL STERILIZATION;  Surgeon: Felipa Emory;  Location: [facility];  Service: Gynecology;  Laterality: Bilateral;  Attempted  . WISDOM TOOTH EXTRACTION      Current Outpatient Medications  Medication Sig Dispense Refill  . 5-HTP CAPS Take by mouth daily.    Marland Kitchen acetaminophen (TYLENOL) 325 MG tablet Take 650 mg by mouth every 6 (six) hours as needed for mild pain or headache.    Marland Kitchen BL  EVENING PRIMROSE OIL PO Take 1 capsule by mouth 2 (two) times daily. Reported on 06/05/2015    . BLACK COHOSH PO Take 1 tablet by mouth 2 (two) times daily. Reported on 06/05/2015    . DHA-EPA-Vitamin E (OMEGA-3 COMPLEX) 192-251-11 MG-MG-UNIT CAPS Take 2 capsules by mouth 2 (two) times daily. Reported on 06/05/2015    . Estradiol-Norethindrone Acet (ACTIVELLA) 0.5-0.1 MG tablet Take 1 tablet by mouth daily. 90 tablet 0  . L-THEANINE PO Take by mouth daily.    . Multiple Vitamins-Minerals (MULTIVITAMINS THER. W/MINERALS) TABS Take 1 tablet by mouth daily. Reported on 06/05/2015    . Nutritional Supplements (CATALYTIC FORMULA) TABS Take 1 tablet by mouth daily. Reported on 06/05/2015    . Safflower Oil (CLA) 1000 MG CAPS     . terbinafine (LAMISIL) 250 MG tablet Take 1 tablet (250 mg total) by mouth daily. 90 tablet 0  . vitamin B-12 (CYANOCOBALAMIN) 100 MCG tablet Take 100 mcg by mouth daily. Reported on 06/05/2015    . zolpidem (AMBIEN CR) 6.25 MG CR tablet Take 1 tablet (6.25 mg total) by mouth at bedtime as needed. for sleep 30 tablet 1   No current facility-administered medications for this visit.     Allergies as of 04/16/2017  . (No Known Allergies)    Vitals: BP 117/69   Pulse (!) 54   Ht 5\' 7"  (1.702 m)   Wt 161 lb (73 kg)   BMI 25.22 kg/m  Last Weight:  Wt Readings from Last 1 Encounters:  04/16/17 161 lb (73 kg)   OIN:OMVE mass index is 25.22 kg/m.     Last Height:   Ht Readings from Last 1 Encounters:  04/16/17 5\' 7"  (1.702 m)     Physical exam:  General: The patient is awake, alert and appears not in acute distress. The patient is well groomed. Head: Normocephalic, atraumatic. Neck is supple. Mallampati .  neck circumference:14.25 . Nasal polyps, runny nose.  Retrognathia is not seen.  Cardiovascular:  Regular rate and rhythm , without  murmurs or carotid bruit, and without distended neck veins. Respiratory: Lungs are clear to auscultation. Skin:  Without evidence of edema, or rash Trunk: BMI is 25 - The patient's posture is erect.  Neurologic exam : The patient is awake and alert, oriented to place and time.  Memory subjective  described as intact. Attention span & concentration ability appears normal.  Speech is fluent,  without  dysarthria, dysphonia or aphasia. Mood and affect are appropriate.  Cranial nerves: Pupils are equal and briskly reactive to light. Funduscopic exam without  evidence of pallor or edema. Extraocular movements  in vertical and horizontal planes intact and without nystagmus. Visual fields by finger perimetry are intact. Hearing to finger rub intact. Facial sensation intact to fine touch. Facial motor strength is symmetric and tongue and uvula move midline. Shoulder shrug was symmetrical.   Motor exam: Normal tone, muscle bulk and symmetric strength in all extremities. Sensory:  Fine touch, pinprick and vibration were tested in all extremities. Proprioception tested in the upper extremities was normal. Coordination: Rapid alternating movements in the fingers/hands was normal. Finger-to-nose maneuver  normal without evidence of ataxia, dysmetria or tremor. Gait and station: Patient walks without assistive device and is able unassisted to climb up to the exam table. Strength within normal limits. Stance is stable and normal. Tandem gait is unfragmented. Turns with 3 Steps.  Deep tendon reflexes: in the  upper and lower extremities are symmetric and intact. Babinski maneuver  response is  downgoing.    Dear Dr. Sabra Heck, as described above in my introductory note, there is clear time correlation to the patient's changing sleep pattern and early morning arousals after she entered perimenopause.. This seems to be no angle of sleep disordered breathing as she lost significant weight and has since then not been known to snore or have apnea.  She wakes up in the early morning hours she wakes up hot and is sweaty.  She describes the very common symptoms of hormonal induced sleep problems, and over-the-counter remedies have not given her relief. I do not intend to keep her on Ambien for a prolonged period of time but would like for her to look at Ambien as something like a Data processing manager, a medication that is available to her should others fail.  She should avoid taking Ambien for more than 4 days in a row. As to hormone replacement therapy, I would like to consider the generic forms of estradiol and progesterone at the lowest dose manufactured for daily intake, estrogen supplement in the morning, progesterone supplements at bedtime.  This should help hot flashes and progesterone also has a sleep promoting effect.  Patient still has some of her Wellbutrin at home and I suggested that she takes this while she is on the initiated hormone replacement therapy in the morning.  The Wellbutrin has a slightly stimulating effect but is not as anxiolytic is some other medications.  It can cause insomnia if taken later in the day.  It does affect mood beneficially.  There is no addiction or habit-forming process known with this medication.  I would recommend to stay away from tricyclics or Paxil as these do often culminate in periodic limb movements and restless leg symptoms.   Assessment:  After physical and neurologic examination, review of laboratory studies,  Personal review of imaging studies, reports of other /same  Imaging studies, results of polysomnography and / or neurophysiology testing and  pre-existing records as far as provided in visit., my assessment is   1) Continue HRT- as your current medication is very expensive you may find similar relief from estradiol in tablet form and progesterone in capsule form.  2) Wellbutrin in the morning, if you find that your mood is quite stable on hormone replacement therapy alone please forego the antidepressant.  3) sleep hygiene: keep your bedroom at 68 degrees, use a light weight blanket, limit screen light and blue light from your bedroom and keep your bedroom quiet. A hot shower or bath before bedtime helps you to enter sleep with a higher core temperature, as the core temperature slowly sinks sleep promoting neurotransmitters are also released.   The patient was advised of the nature of the diagnosed disorder , the treatment options and the  risks for general health and wellness arising from not treating the condition.   I spent more than 35 minutes of face to face time with the patient.  Greater than 50% of time was spent in counseling and coordination of care. We have discussed the diagnosis and differential and I answered the patient's questions.    Plan:  PRN RV-    Larey Seat, MD 2/72/5366, 4:40 PM  Certified in Neurology by ABPN Certified in [place] by [facility] [address] [place], [place] [address]

## 2017-05-02 ENCOUNTER — Encounter: Payer: Self-pay | Admitting: Obstetrics & Gynecology

## 2017-05-02 ENCOUNTER — Ambulatory Visit (INDEPENDENT_AMBULATORY_CARE_PROVIDER_SITE_OTHER): Payer: 59 | Admitting: Obstetrics & Gynecology

## 2017-05-02 VITALS — BP 108/60 | HR 60 | Resp 14 | Wt 160.0 lb

## 2017-05-02 DIAGNOSIS — Z79890 Hormone replacement therapy (postmenopausal): Secondary | ICD-10-CM | POA: Diagnosis not present

## 2017-05-02 DIAGNOSIS — N951 Menopausal and female climacteric states: Secondary | ICD-10-CM

## 2017-05-02 NOTE — Progress Notes (Signed)
GYNECOLOGY  VISIT  CC:   Discuss HRT  HPI: 47 y.o. G4P2 Married Caucasian female here for HRT follow up.  Started Activella HS daily.  Reports mood is better and hot flashes are better.  Sleep is, therefore, better.  At times, feels PMS like.  She is having a little more lower extremity edema at the end of the day.  This isn't very obvious but just feels like her feet are tight.  Denies breast tenderness.  Also reports she had a little more issue with headaches.  Recently saw her neurologist, Dr. Arrie Senate who suggested we discussed this to see if dose adjustment needs to be made.  GYNECOLOGIC HISTORY: No LMP recorded. Patient has had an ablation. Contraception: post menopausal  Menopausal hormone therapy: activella   Patient Active Problem List   Diagnosis Date Noted  . Symptoms, such as flushing, sleeplessness, headache, lack of concentration, associated with the menopause 04/16/2017  . Acute right hip pain 03/24/2017  . Dyslipidemia with elevated low density lipoprotein (LDL) cholesterol and abnormally low high density lipoprotein cholesterol 03/24/2017  . Insomnia 12/12/2016  . Onychomycosis 12/12/2016  . Healthcare maintenance 12/11/2016  . Arthritis 11/10/2014    Past Medical History:  Diagnosis Date  . Abnormal Pap smear   . Anxiety   . Arthritis    hands  . Depression   . Dyslipidemia with elevated low density lipoprotein (LDL) cholesterol and abnormally low high density lipoprotein cholesterol    diet controlled  . Eczema   . Hypertension    resolved - lost weight, no med  . SVD (spontaneous vaginal delivery)    x 2    Past Surgical History:  Procedure Laterality Date  . DILATATION & CURETTAGE/HYSTEROSCOPY WITH MYOSURE N/A 06/16/2015   Procedure: DILATATION & CURETTAGE/HYSTEROSCOPY WITH Novasure;  Surgeon: [name], MD;  Location: [facility];  Service: Gynecology;  Laterality: N/A;  . GYNECOLOGIC CRYOSURGERY    . IUD REMOVAL  6/12   in cx  . LAPAROSCOPIC TUBAL  LIGATION  03/04/2011   Procedure: LAPAROSCOPIC TUBAL LIGATION;  Surgeon: Felipa Emory;  Location: [facility];  Service: Gynecology;  Laterality: Bilateral;  with filshie clips  . TUBAL LIGATION  03/04/2011   Procedure: ESSURE TUBAL STERILIZATION;  Surgeon: Felipa Emory;  Location: [facility];  Service: Gynecology;  Laterality: Bilateral;  Attempted  . WISDOM TOOTH EXTRACTION      MEDS:   Current Outpatient Medications on File Prior to Visit  Medication Sig Dispense Refill  . 5-HTP CAPS Take by mouth daily.    Marland Kitchen acetaminophen (TYLENOL) 325 MG tablet Take 650 mg by mouth every 6 (six) hours as needed for mild pain or headache.    . BL EVENING PRIMROSE OIL PO Take 1 capsule by mouth 2 (two) times daily. Reported on 06/05/2015    . BLACK COHOSH PO Take 1 tablet by mouth 2 (two) times daily. Reported on 06/05/2015    . DHA-EPA-Vitamin E (OMEGA-3 COMPLEX) 192-251-11 MG-MG-UNIT CAPS Take 2 capsules by mouth 2 (two) times daily. Reported on 06/05/2015    . Estradiol-Norethindrone Acet (ACTIVELLA) 0.5-0.1 MG tablet Take 1 tablet by mouth daily. 90 tablet 0  . L-THEANINE PO Take by mouth daily.    . Multiple Vitamins-Minerals (MULTIVITAMINS THER. W/MINERALS) TABS Take 1 tablet by mouth daily. Reported on 06/05/2015    . Nutritional Supplements (CATALYTIC FORMULA) TABS Take 1 tablet by mouth daily. Reported on 06/05/2015    . Safflower Oil (CLA) 1000 MG CAPS     .  terbinafine (LAMISIL) 250 MG tablet Take 1 tablet (250 mg total) by mouth daily. 90 tablet 0  . vitamin B-12 (CYANOCOBALAMIN) 100 MCG tablet Take 100 mcg by mouth daily. Reported on 06/05/2015    . zolpidem (AMBIEN) 10 MG tablet Take 0.5-1 tablets (5-10 mg total) by mouth at bedtime as needed for sleep. 30 tablet 0   No current facility-administered medications on file prior to visit.     ALLERGIES: Patient has no known allergies.  Family History  Problem Relation Age of Onset  . Hypertension Father   . Hyperlipidemia Father   . Thyroid  disease Mother   . Hypertension Sister   . Hyperlipidemia Sister   . Healthy Sister   . Healthy Daughter   . Healthy Son     SH: Married, non-smoker  Review of Systems  Cardiovascular: Positive for leg swelling.  Neurological: Positive for headaches.  All other systems reviewed and are negative.   PHYSICAL EXAMINATION:    BP 108/60 (BP Location: Right Arm, Patient Position: Sitting, Cuff Size: Normal)   Pulse 60   Resp 14   Wt 160 lb (72.6 kg)   BMI 25.06 kg/m     General appearance: alert, cooperative and appears stated age No other exam was performed.  Discussion: Following the estrogen dose and her HRT, patient would be able to experience left headaches and have less lower extremity edema.  She still has about 2 months of Activella  HS remaining.  I think she would do well with Vivelle dot 0.0375 mg patch dosing but the patient does not want to transition at this time.  So, I will have her take 1 tablet of the Activella HS and alternate this with half tab every other night.  If this does not make any difference in symptoms and she has no worsening hot flashes, I would like her to call us as I would change her dosing to half tab daily.  Patient voiced understanding.  Assessment: HRT use Mild lower extremity swelling, bilaterally, and mild increase in headaches  Plan: Patient will start taking her HRT 1 tablet, alternating with 1/2 tablet, every other night.  She will give me an update in about 3-4 weeks.  If she is not experiencing any new hot flashes then I would decrease her to one half tab daily.  Depending on response to this change, we may need to consider a Vivelle dot for the patient.  Questions answered.   ~15 minutes spent with patient >50% of time was in face to face discussion of above.

## 2017-05-21 ENCOUNTER — Other Ambulatory Visit: Payer: Self-pay | Admitting: Neurology

## 2017-05-21 ENCOUNTER — Encounter: Payer: Self-pay | Admitting: Neurology

## 2017-06-03 MED FILL — ZOLPIDEM TART ER 6.25 MG TA: 6.25 | 30 days supply | Qty: 30 | Fill #1

## 2017-06-05 NOTE — Progress Notes (Signed)
47 y.o. G4P2 MarriedCaucasianF here for annual exam.  Doing well except for stressors with new job.  Taking 1/2 tablet of HRT dosage.  Hot flashes are under good control.  Sleep is better.  Weaning off of ambien.      No LMP recorded. Patient has had an ablation.          Sexually active: Yes.    The current method of family planning is tubal ligation.    Exercising: Yes.    HIIT Smoker:  no  Health Maintenance: Pap:  11-10-14 neg, 02-23-16 neg HPV HR neg History of abnormal Pap:  yes MMG:  04-02-16 category c density birads 1:neg Colonoscopy:  New guidelines reviewed.  IFOB given. BMD:   none TDaP:  2011  Pneumonia vaccine(s):  Not done Shingrix:   Not done Hep C testing: unsure if done Screening Labs: needs fasting lipid panel   reports that she quit smoking about 19 years ago. Her smoking use included cigarettes. She has a 3.25 pack-year smoking history. She has never used smokeless tobacco. She reports that she drinks about 0.6 oz of alcohol per week. She reports that she does not use drugs.  Past Medical History:  Diagnosis Date  . Abnormal Pap smear   . Anxiety   . Arthritis    hands  . Depression   . Dyslipidemia with elevated low density lipoprotein (LDL) cholesterol and abnormally low high density lipoprotein cholesterol    diet controlled  . Eczema   . Hypertension    resolved - lost weight, no med  . SVD (spontaneous vaginal delivery)    x 2    Past Surgical History:  Procedure Laterality Date  . DILATATION & CURETTAGE/HYSTEROSCOPY WITH MYOSURE N/A 06/16/2015   Procedure: DILATATION & CURETTAGE/HYSTEROSCOPY WITH Novasure;  Surgeon: [name], MD;  Location: [facility];  Service: Gynecology;  Laterality: N/A;  . GYNECOLOGIC CRYOSURGERY    . IUD REMOVAL  6/12   in cx  . LAPAROSCOPIC TUBAL LIGATION  03/04/2011   Procedure: LAPAROSCOPIC TUBAL LIGATION;  Surgeon: Felipa Emory;  Location: [facility];  Service: Gynecology;  Laterality: Bilateral;  with filshie clips   . TUBAL LIGATION  03/04/2011   Procedure: ESSURE TUBAL STERILIZATION;  Surgeon: Felipa Emory;  Location: [facility];  Service: Gynecology;  Laterality: Bilateral;  Attempted  . WISDOM TOOTH EXTRACTION      Current Outpatient Medications  Medication Sig Dispense Refill  . 5-HTP CAPS Take by mouth daily.    Marland Kitchen acetaminophen (TYLENOL) 325 MG tablet Take 650 mg by mouth every 6 (six) hours as needed for mild pain or headache.    . BL EVENING PRIMROSE OIL PO Take 1 capsule by mouth 2 (two) times daily. Reported on 06/05/2015    . BLACK COHOSH PO Take 1 tablet by mouth 2 (two) times daily. Reported on 06/05/2015    . DHA-EPA-Vitamin E (OMEGA-3 COMPLEX) 192-251-11 MG-MG-UNIT CAPS Take 2 capsules by mouth 2 (two) times daily. Reported on 06/05/2015    . Estradiol-Norethindrone Acet (ACTIVELLA) 0.5-0.1 MG tablet Take 1 tablet by mouth daily. 90 tablet 0  . L-THEANINE PO Take by mouth daily.    . Multiple Vitamins-Minerals (MULTIVITAMINS THER. W/MINERALS) TABS Take 1 tablet by mouth daily. Reported on 06/05/2015    . Nutritional Supplements (CATALYTIC FORMULA) TABS Take 1 tablet by mouth daily. Reported on 06/05/2015    . Safflower Oil (CLA) 1000 MG CAPS     . terbinafine (LAMISIL) 250 MG tablet Take 1  tablet (250 mg total) by mouth daily. (Patient taking differently: Take 250 mg by mouth as needed. ) 90 tablet 0  . vitamin B-12 (CYANOCOBALAMIN) 100 MCG tablet Take 100 mcg by mouth daily. Reported on 06/05/2015    . zolpidem (AMBIEN CR) 6.25 MG CR tablet   1   No current facility-administered medications for this visit.     Family History  Problem Relation Age of Onset  . Hypertension Father   . Hyperlipidemia Father   . Thyroid disease Mother   . Hypertension Sister   . Hyperlipidemia Sister   . Healthy Sister   . Healthy Daughter   . Healthy Son     Review of Systems  All other systems reviewed and are negative.   Exam:   BP 102/62   Pulse 64   Resp 16   Ht 5' 6.75" (1.695 m)   Wt 157  lb (71.2 kg)   BMI 24.77 kg/m   Height:   Height: 5' 6.75" (169.5 cm)  Ht Readings from Last 3 Encounters:  06/06/17 5' 6.75" (1.695 m)  04/16/17 5\' 7"  (1.702 m)  03/24/17 5\' 7"  (1.702 m)    General appearance: alert, cooperative and appears stated age Head: Normocephalic, without obvious abnormality, atraumatic Neck: no adenopathy, supple, symmetrical, trachea midline and thyroid normal to inspection and palpation Lungs: clear to auscultation bilaterally Breasts: normal appearance, no masses or tenderness Heart: regular rate and rhythm Abdomen: soft, non-tender; bowel sounds normal; no masses,  no organomegaly Extremities: extremities normal, atraumatic, no cyanosis or edema Skin: Skin color, texture, turgor normal. No rashes or lesions Lymph nodes: Cervical, supraclavicular, and axillary nodes normal. No abnormal inguinal nodes palpated Neurologic: Grossly normal   Pelvic: External genitalia:  no lesions              Urethra:  normal appearing urethra with no masses, tenderness or lesions              Bartholins and Skenes: normal                 Vagina: normal appearing vagina with normal color and discharge, no lesions              Cervix: no lesions              Pap taken: No. Bimanual Exam:  Uterus:  normal size, contour, position, consistency, mobility, non-tender              Adnexa: normal adnexa and no mass, fullness, tenderness               Rectovaginal: Confirms               Anus:  normal sphincter tone, no lesions  Chaperone was present for exam.  A:  Well Woman with normal exam PMP, no HRT H/o hypertension that resolved with weight loss Elevated lipids  P:   Mammogram guidelines reviewed pap smear and neg HR HPV 12/18 Will return for fastin lipids IFOB given RF for activella 0.5/0.1mg  daily.  Taking 1/2 tab daily.  #90/4RF.   Return annually or prn

## 2017-06-06 ENCOUNTER — Encounter: Payer: Self-pay | Admitting: Obstetrics & Gynecology

## 2017-06-06 ENCOUNTER — Ambulatory Visit (INDEPENDENT_AMBULATORY_CARE_PROVIDER_SITE_OTHER): Payer: 59 | Admitting: Obstetrics & Gynecology

## 2017-06-06 ENCOUNTER — Other Ambulatory Visit: Payer: Self-pay

## 2017-06-06 VITALS — BP 102/62 | HR 64 | Resp 16 | Ht 66.75 in | Wt 157.0 lb

## 2017-06-06 DIAGNOSIS — Z01419 Encounter for gynecological examination (general) (routine) without abnormal findings: Secondary | ICD-10-CM

## 2017-06-06 DIAGNOSIS — Z1211 Encounter for screening for malignant neoplasm of colon: Secondary | ICD-10-CM | POA: Diagnosis not present

## 2017-06-06 MED ORDER — ESTRADIOL-NORETHINDRONE ACET 0.5-0.1 MG PO TABS: 1.0000 | ORAL_TABLET | Freq: Every day | ORAL | 4 refills | Status: DC

## 2017-06-18 ENCOUNTER — Other Ambulatory Visit: Payer: Self-pay | Admitting: Obstetrics & Gynecology

## 2017-06-18 DIAGNOSIS — Z139 Encounter for screening, unspecified: Secondary | ICD-10-CM

## 2017-07-21 ENCOUNTER — Ambulatory Visit
Admission: RE | Admit: 2017-07-21 | Discharge: 2017-07-21 | Disposition: A | Discharge status: Home / Self Care | Payer: 59 | Source: Ambulatory Visit | Attending: Obstetrics & Gynecology | Admitting: Obstetrics & Gynecology

## 2017-07-21 DIAGNOSIS — Z139 Encounter for screening, unspecified: Secondary | ICD-10-CM

## 2017-07-21 DIAGNOSIS — Z1231 Encounter for screening mammogram for malignant neoplasm of breast: Secondary | ICD-10-CM | POA: Diagnosis not present

## 2017-08-04 ENCOUNTER — Encounter: Payer: Self-pay | Admitting: Obstetrics & Gynecology

## 2017-08-04 NOTE — Telephone Encounter (Signed)
Patient sent the following message through [place]. Routing to triage to assist patient with request.   ----- Message from [place], Generic sent at 08/04/2017 1:08 PM EDT -----    I would like a refill on the ambien 6.5 at the [facility] pharmacy.    Thanks

## 2017-08-05 ENCOUNTER — Encounter: Payer: Self-pay | Admitting: Adult Health

## 2017-08-05 ENCOUNTER — Telehealth: Payer: Self-pay | Admitting: *Deleted

## 2017-08-05 NOTE — Telephone Encounter (Signed)
Spoke with patient. Ambien last filled by PCP on 06/06/17 with 1 refill. Patient state she was unsure who she needed to request refill from.  Recommended patient f/u with PCP for further refills. Patient verbalizes understanding.    From [name] To [name], MD Sent 08/04/2017 1:08 PM  I would like a refill on the ambien 6.5 at the [facility] Patient pharmacy.   Thanks      Routing to provider for final review. Patient is agreeable to disposition. Will close encounter.

## 2017-08-05 NOTE — Telephone Encounter (Signed)
See telephone encounter dated 08/05/17 to review with provider.

## 2017-08-06 ENCOUNTER — Telehealth: Payer: Self-pay | Admitting: Obstetrics & Gynecology

## 2017-08-06 ENCOUNTER — Encounter: Payer: Self-pay | Admitting: Obstetrics & Gynecology

## 2017-08-06 NOTE — Telephone Encounter (Signed)
Please check with pt about the dosage.  She is taking the 6.25mg  CR (controlled release) and the 10mg  she is requesting is not a controlled release so will act over a shorter period of time.  Is that what she wants?  Just want to get the rx correct before I send it.  Thanks.

## 2017-08-06 NOTE — Telephone Encounter (Signed)
Spoke with patient, advised as seen below per Dr. Sabra Heck. Patient states she has been cutting 6.25 mg CR in half, trying to wean off, would like to continue with this dosage.   Advised will update Dr. Sabra Heck and notify once RX has been sent.  Routing to Dr. Sabra Heck.

## 2017-08-06 NOTE — Telephone Encounter (Signed)
Left message to call Siobahn Worsley at [phone].  

## 2017-08-06 NOTE — Telephone Encounter (Signed)
Spoke with patient, advised as seen below per Dr. Sabra Heck. Patient request 10 mg tab non-CR, will cut them in 1/2 as she weans off.   Routing to Dr. Sabra Heck

## 2017-08-06 NOTE — Telephone Encounter (Signed)
She should not be cutting the CR in 1/2.  It changes how the medication is delivered.  She gets the medication in her system much faster if she is cutting the CR in 1/2.  Should use the non-CR dosage and cut these in 1/2.  Please let me know if she wants the 5 or 10 mg dosage.  Thanks.

## 2017-08-06 NOTE — Telephone Encounter (Signed)
Patient sent the following correspondence through [address]. Routing to triage to assist patient with request. Please see telephone note from yesterday, 08/05/17.  ----- Message from [place], Generic sent at 08/06/2017 8:15 AM EDT -----    Good morning,  I am responding to the request for ambien refill. I did message Mina Marble and she stated that Dr. Sabra Heck was the last one who wrote/refilled a script for me. So if we can see if she can do a new script that would be great. Also Dr. Brett Fairy  had suggested that a 10mg  would be better as I can cut that in half.    Thanks!  Webb Silversmith

## 2017-08-06 NOTE — Telephone Encounter (Signed)
Call placed to [place]. Was advised last RX filled 06/03/17 for Ambien CR 6.25 mg #30/0RF, written by Dr. Sabra Heck.    Dr. Sabra Heck -see MyChart message below and advise on ambien Rx?

## 2017-08-08 MED ORDER — ZOLPIDEM TARTRATE 10 MG PO TABS: 10.0000 mg | ORAL_TABLET | Freq: Every evening | ORAL | 2 refills | Status: DC | PRN

## 2017-08-08 MED FILL — ZOLPIDEM TARTRATE 10 MG TAB: 10 | 30 days supply | Qty: 30 | Fill #0

## 2017-08-08 NOTE — Addendum Note (Signed)
Addended by: [name] on: 08/08/2017 01:20 PM   Modules accepted: Orders

## 2017-08-11 ENCOUNTER — Encounter: Payer: Self-pay | Admitting: Obstetrics & Gynecology

## 2017-11-03 MED FILL — buPROPion HCL ER (XL) 150 M: 150 | 30 days supply | Qty: 30 | Fill #1

## 2017-11-03 MED FILL — ZOLPIDEM TARTRATE 10 MG TAB: 10 | 30 days supply | Qty: 30 | Fill #1

## 2017-11-25 ENCOUNTER — Telehealth: Payer: Self-pay | Admitting: Obstetrics & Gynecology

## 2017-11-25 NOTE — Telephone Encounter (Signed)
Spoke with patient. She c/o pelvic "fullness" for 3 days. She looked with a mirror and thinks she may have a prolapse. Denies pain or any voiding or stooling difficulties.   Office visit with Dr. Sabra Heck scheduled at patient convenience for 11/28/17. She is advised to call back with any increase in symptoms or if develops pain with voiding or passing stool.   Routing to Dr. Sabra Heck. Will close.

## 2017-11-25 NOTE — Telephone Encounter (Signed)
Patient is asking to talk with a nurse about "uterine prolapse".

## 2017-11-26 DIAGNOSIS — H52223 Regular astigmatism, bilateral: Secondary | ICD-10-CM | POA: Diagnosis not present

## 2017-11-28 ENCOUNTER — Encounter: Payer: Self-pay | Admitting: Obstetrics & Gynecology

## 2017-11-28 ENCOUNTER — Ambulatory Visit (INDEPENDENT_AMBULATORY_CARE_PROVIDER_SITE_OTHER): Payer: 59 | Admitting: Obstetrics & Gynecology

## 2017-11-28 VITALS — BP 104/60 | HR 60 | Resp 16 | Ht 66.75 in | Wt 160.4 lb

## 2017-11-28 DIAGNOSIS — N898 Other specified noninflammatory disorders of vagina: Secondary | ICD-10-CM | POA: Diagnosis not present

## 2017-11-28 NOTE — Progress Notes (Signed)
GYNECOLOGY  VISIT  CC:   Possible prolapse  HPI: 47 y.o. [id number] Married White or Caucasian female here for pelvic fullness.  This started after lifting a bunch of hay bales on Saturday.  On Sunday and Monday, she noted increased pressure and fullness vaginally.  She isn't seeing anything.  She is not sure she is emptying bladder completely.  She does seem to have more fullness in the morning as well.  She is having a little vaginal discharge as well.  Denies vaginal bleeding.  She is worried about prolapse and felt she needed to come in for OV.    GYNECOLOGIC HISTORY:  No LMP recorded. Patient has had an ablation. Contraception: BTL Menopausal hormone therapy: none  Patient Active Problem List   Diagnosis Date Noted  . Symptoms, such as flushing, sleeplessness, headache, lack of concentration, associated with the menopause 04/16/2017  . Acute right hip pain 03/24/2017  . Dyslipidemia with elevated low density lipoprotein (LDL) cholesterol and abnormally low high density lipoprotein cholesterol 03/24/2017  . Insomnia 12/12/2016  . Onychomycosis 12/12/2016  . Healthcare maintenance 12/11/2016  . Arthritis 11/10/2014    Past Medical History:  Diagnosis Date  . Abnormal Pap smear   . Anxiety   . Arthritis    hands  . Depression   . Dyslipidemia with elevated low density lipoprotein (LDL) cholesterol and abnormally low high density lipoprotein cholesterol    diet controlled  . Eczema   . Hypertension    resolved - lost weight, no med  . SVD (spontaneous vaginal delivery)    x 2    Past Surgical History:  Procedure Laterality Date  . DILATATION & CURETTAGE/HYSTEROSCOPY WITH MYOSURE N/A 06/16/2015   Procedure: DILATATION & CURETTAGE/HYSTEROSCOPY WITH Novasure;  Surgeon: [name], MD;  Location: [facility];  Service: Gynecology;  Laterality: N/A;  . GYNECOLOGIC CRYOSURGERY    . IUD REMOVAL  6/12   in cx  . LAPAROSCOPIC TUBAL LIGATION  03/04/2011   Procedure: LAPAROSCOPIC  TUBAL LIGATION;  Surgeon: Felipa Emory;  Location: [facility];  Service: Gynecology;  Laterality: Bilateral;  with filshie clips  . TUBAL LIGATION  03/04/2011   Procedure: ESSURE TUBAL STERILIZATION;  Surgeon: Felipa Emory;  Location: [facility];  Service: Gynecology;  Laterality: Bilateral;  Attempted  . WISDOM TOOTH EXTRACTION      MEDS:   Current Outpatient Medications on File Prior to Visit  Medication Sig Dispense Refill  . 5-HTP CAPS Take by mouth daily.    Marland Kitchen acetaminophen (TYLENOL) 325 MG tablet Take 650 mg by mouth every 6 (six) hours as needed for mild pain or headache.    . BL EVENING PRIMROSE OIL PO Take 1 capsule by mouth 2 (two) times daily. Reported on 06/05/2015    . BLACK COHOSH PO Take 1 tablet by mouth 2 (two) times daily. Reported on 06/05/2015    . buPROPion (WELLBUTRIN XL) 150 MG 24 hr tablet Take 0.5 tablets by mouth daily.  2  . DHA-EPA-Vitamin E (OMEGA-3 COMPLEX) 192-251-11 MG-MG-UNIT CAPS Take 2 capsules by mouth 2 (two) times daily. Reported on 06/05/2015    . L-THEANINE PO Take by mouth daily.    . Multiple Vitamins-Minerals (MULTIVITAMINS THER. W/MINERALS) TABS Take 1 tablet by mouth daily. Reported on 06/05/2015    . Nutritional Supplements (CATALYTIC FORMULA) TABS Take 1 tablet by mouth daily. Reported on 06/05/2015    . pyridOXINE (VITAMIN B-6) 25 MG tablet Take by mouth daily.    . Safflower Oil (CLA)  1000 MG CAPS     . terbinafine (LAMISIL) 250 MG tablet Take 1 tablet (250 mg total) by mouth daily. (Patient taking differently: Take 250 mg by mouth as needed. ) 90 tablet 0  . vitamin B-12 (CYANOCOBALAMIN) 100 MCG tablet Take 100 mcg by mouth daily. Reported on 06/05/2015    . zolpidem (AMBIEN) 10 MG tablet Take 1 tablet (10 mg total) by mouth at bedtime as needed for sleep. 30 tablet 2   No current facility-administered medications on file prior to visit.     ALLERGIES: Patient has no known allergies.  Family History  Problem Relation Age of Onset  .  Hypertension Father   . Hyperlipidemia Father   . Thyroid disease Mother   . Hypertension Sister   . Hyperlipidemia Sister   . Healthy Sister   . Healthy Daughter   . Healthy Son     SH:  Married, non smoker  Review of Systems  Genitourinary: Positive for urgency.       Loss of urine with sneeze or cough   All other systems reviewed and are negative.   PHYSICAL EXAMINATION:    BP 104/60 (BP Location: Right Arm, Patient Position: Sitting, Cuff Size: Normal)   Pulse 60   Resp 16   Ht 5' 6.75" (1.695 m)   Wt 160 lb 6.4 oz (72.8 kg)   BMI 25.31 kg/m     General appearance: alert, cooperative and appears stated age Lymph:  no inguinal LAD noted  Pelvic: External genitalia:  no lesions              Urethra:  normal appearing urethra with no masses, tenderness or lesions              Bartholins and Skenes: normal                 Vagina: normal appearing vagina with whitish and slightly watery discharge, no evidence of prolapse              Cervix: no lesions              Bimanual Exam:  Uterus:  normal size, contour, position, consistency, mobility, non-tender              Adnexa: no mass, fullness, tenderness  Chaperone was present for exam.  Assessment: Vaginal discharge No evidence of prolapse  Plan: Affirm pending.  Suspect vaginitis is causing swelling and irritation and hopefully this is the cause of her symptoms.  Will await results before proceeding with treatment.  Pt is comfortable with this plan.

## 2017-11-29 LAB — VAGINITIS/VAGINOSIS, DNA PROBE
Candida Species: NEGATIVE
Gardnerella vaginalis: NEGATIVE
Trichomonas vaginosis: NEGATIVE

## 2017-12-02 ENCOUNTER — Telehealth: Payer: Self-pay | Admitting: Obstetrics & Gynecology

## 2017-12-02 ENCOUNTER — Encounter: Payer: Self-pay | Admitting: Obstetrics & Gynecology

## 2017-12-02 MED ORDER — ESTRADIOL 0.1 MG/GM VA CREA: TOPICAL_CREAM | VAGINAL | 0 refills | Status: DC

## 2017-12-02 NOTE — Telephone Encounter (Signed)
Spoke with patient, advised as seen below per Dr. Sabra Heck. RX for estrace vaginal cream to verified pharmacy. Patient will call with update or if any additional questions.  Routing to provider for final review. Patient is agreeable to disposition. Will close encounter.

## 2017-12-02 NOTE — Telephone Encounter (Signed)
Patient sent the following correspondence through [place]. Routing to triage to assist patient with request.  Hi Dr. Sabra Heck,  I was checking on the status of my urine and swab test from Friday 11/28/17.    Have a great day!  Angela Wells

## 2017-12-02 NOTE — Telephone Encounter (Signed)
Spoke with patient, advised 11/28/17 vaginitis testing negative for BV, yeast and trich.   Patient reports symptoms of vaginal swelling and irritation have not resolved. No new symptoms.  Advised I will review with Dr. Sabra Heck and return call with recommendations, patient is agreeable.   Dr. Sabra Heck -please advise.

## 2017-12-02 NOTE — Telephone Encounter (Signed)
Would she consider trying topical vaginal estrogen cream.  This could be hormonally related as estrogen decreases can cause vaginitis as well.  Ok to send in rx for estrace vaginal cream 1 gram three times weekly for the next two weeks and then decrease to twice weekly.  Does she just want to give me an update or have OV for recheck?

## 2017-12-18 DIAGNOSIS — H6121 Impacted cerumen, right ear: Secondary | ICD-10-CM | POA: Diagnosis not present

## 2017-12-18 DIAGNOSIS — H6061 Unspecified chronic otitis externa, right ear: Secondary | ICD-10-CM | POA: Diagnosis not present

## 2018-02-03 MED FILL — ZOLPIDEM TARTRATE 10 MG TAB: 10 | 30 days supply | Qty: 30 | Fill #2

## 2018-02-04 MED FILL — buPROPion HCL ER (XL) 150 M: 150 | 30 days supply | Qty: 30 | Fill #2

## 2018-04-29 ENCOUNTER — Other Ambulatory Visit: Payer: Self-pay | Admitting: Obstetrics & Gynecology

## 2018-04-29 MED ORDER — ZOLPIDEM TARTRATE 10 MG PO TABS: ORAL_TABLET | ORAL | 1 refills | Status: DC

## 2018-04-29 MED FILL — ZOLPIDEM TARTRATE 10 MG TAB: 10 | 30 days supply | Qty: 30 | Fill #0

## 2018-04-29 NOTE — Telephone Encounter (Signed)
Medication refill request: Ambien Last AEX: 06/06/17  Next AEX: 08/14/18 Last MMG (if hormonal medication request): 07/21/17  Bi-rads 1 neg  Refill authorized: #30 with 2 RF

## 2018-04-29 NOTE — Telephone Encounter (Signed)
Will forward to Dr. Sabra Heck.

## 2018-05-14 ENCOUNTER — Other Ambulatory Visit: Payer: Self-pay | Admitting: Gynecologic Oncology

## 2018-05-14 DIAGNOSIS — J111 Influenza due to unidentified influenza virus with other respiratory manifestations: Secondary | ICD-10-CM

## 2018-05-14 MED ORDER — OSELTAMIVIR PHOSPHATE 75 MG PO CAPS: 75.0000 mg | ORAL_CAPSULE | Freq: Two times a day (BID) | ORAL | 0 refills | Status: DC

## 2018-05-14 MED FILL — OSELTAMIVIR PHOSPHATE 75 MG: 75 | 7 days supply | Qty: 14 | Fill #0

## 2018-05-14 NOTE — Progress Notes (Signed)
Patient presents to the office with flu symptoms.   Recent exposure to flu with her husband with confirmed testing and now having chills, flu symptoms.  Tamiflu prescribed.

## 2018-08-12 ENCOUNTER — Other Ambulatory Visit: Payer: Self-pay

## 2018-08-14 ENCOUNTER — Ambulatory Visit (INDEPENDENT_AMBULATORY_CARE_PROVIDER_SITE_OTHER): Payer: 59 | Admitting: Obstetrics & Gynecology

## 2018-08-14 ENCOUNTER — Other Ambulatory Visit: Payer: Self-pay

## 2018-08-14 ENCOUNTER — Encounter: Payer: Self-pay | Admitting: Obstetrics & Gynecology

## 2018-08-14 ENCOUNTER — Other Ambulatory Visit (HOSPITAL_COMMUNITY)
Admission: RE | Admit: 2018-08-14 | Discharge: 2018-08-14 | Disposition: A | Discharge status: Home / Self Care | Payer: 59 | Source: Ambulatory Visit | Attending: Obstetrics & Gynecology | Admitting: Obstetrics & Gynecology

## 2018-08-14 VITALS — BP 110/70 | HR 58 | Temp 97.6°F | Ht 66.5 in | Wt 164.0 lb

## 2018-08-14 DIAGNOSIS — R748 Abnormal levels of other serum enzymes: Secondary | ICD-10-CM

## 2018-08-14 DIAGNOSIS — Z01419 Encounter for gynecological examination (general) (routine) without abnormal findings: Secondary | ICD-10-CM | POA: Diagnosis not present

## 2018-08-14 DIAGNOSIS — Z124 Encounter for screening for malignant neoplasm of cervix: Secondary | ICD-10-CM

## 2018-08-14 DIAGNOSIS — Z0000 Encounter for general adult medical examination without abnormal findings: Secondary | ICD-10-CM | POA: Diagnosis not present

## 2018-08-14 DIAGNOSIS — R7989 Other specified abnormal findings of blood chemistry: Secondary | ICD-10-CM

## 2018-08-14 NOTE — Progress Notes (Signed)
48 y.o. [id number] Married White or Caucasian female here for annual exam.  Doing well.  Still having hot flashes.  Has recently restarted the gabapentin.  She alternates this with herbal supplement.  Did try HRT but really does not want to be on this.    Denies vaginal bleeding.    No LMP recorded. Patient has had an ablation.          Sexually active: Yes.    The current method of family planning is tubal ligation.    Exercising: Yes.    weights, elliptical  Smoker:  no  Health Maintenance: Pap:  02/23/16 neg. HR HPV:neg   11/10/14 neg  History of abnormal Pap: yes. Hx of cryo  MMG:  07/21/17 BIRADS1:Neg.  Colonoscopy:  Never BMD:   never TDaP:  2011 Pneumonia vaccine(s):  n/a Shingrix:   n/a Hep C testing: 12/18/04 neg  Screening Labs: here today     reports that she quit smoking about 20 years ago. Her smoking use included cigarettes. She has a 3.25 pack-year smoking history. She has never used smokeless tobacco. She reports current alcohol use. She reports that she does not use drugs.  Past Medical History:  Diagnosis Date  . Abnormal Pap smear   . Anxiety   . Arthritis    hands  . Depression   . Dyslipidemia with elevated low density lipoprotein (LDL) cholesterol and abnormally low high density lipoprotein cholesterol    diet controlled  . Eczema   . Hypertension    resolved - lost weight, no med  . SVD (spontaneous vaginal delivery)    x 2    Past Surgical History:  Procedure Laterality Date  . DILATATION & CURETTAGE/HYSTEROSCOPY WITH MYOSURE N/A 06/16/2015   Procedure: DILATATION & CURETTAGE/HYSTEROSCOPY WITH Novasure;  Surgeon: [name], MD;  Location: [facility];  Service: Gynecology;  Laterality: N/A;  . GYNECOLOGIC CRYOSURGERY    . IUD REMOVAL  6/12   in cx  . LAPAROSCOPIC TUBAL LIGATION  03/04/2011   Procedure: LAPAROSCOPIC TUBAL LIGATION;  Surgeon: Felipa Emory;  Location: [facility];  Service: Gynecology;  Laterality: Bilateral;  with filshie clips  . TUBAL  LIGATION  03/04/2011   Procedure: ESSURE TUBAL STERILIZATION;  Surgeon: Felipa Emory;  Location: [facility];  Service: Gynecology;  Laterality: Bilateral;  Attempted  . WISDOM TOOTH EXTRACTION      Current Outpatient Medications  Medication Sig Dispense Refill  . 5-HTP CAPS Take by mouth daily.    Marland Kitchen acetaminophen (TYLENOL) 325 MG tablet Take 650 mg by mouth every 6 (six) hours as needed for mild pain or headache.    . BL EVENING PRIMROSE OIL PO Take 1 capsule by mouth 2 (two) times daily. Reported on 06/05/2015    . BLACK COHOSH PO Take 1 tablet by mouth 2 (two) times daily. Reported on 06/05/2015    . buPROPion (WELLBUTRIN XL) 150 MG 24 hr tablet Take 0.5 tablets by mouth daily.  2  . Cyanocobalamin (VITAMIN B 12) 100 MCG LOZG Take by mouth.    . DHA-EPA-Vitamin E (OMEGA-3 COMPLEX) 192-251-11 MG-MG-UNIT CAPS Take 2 capsules by mouth 2 (two) times daily. Reported on 06/05/2015    . gabapentin (NEURONTIN) 100 MG capsule Take 100 mg by mouth at bedtime.    . Multiple Vitamins-Minerals (MULTIVITAMINS THER. W/MINERALS) TABS Take 1 tablet by mouth daily. Reported on 06/05/2015    . Nutritional Supplements (CATALYTIC FORMULA) TABS Take 1 tablet by mouth daily. Reported on 06/05/2015    .  pyridOXINE (VITAMIN B-6) 25 MG tablet Take by mouth daily.    . Safflower Oil (CLA) 1000 MG CAPS     . zolpidem (AMBIEN) 10 MG tablet 1/2 to 1 tab qhs prn insomnia 30 tablet 1   No current facility-administered medications for this visit.     Family History  Problem Relation Age of Onset  . Hypertension Father   . Hyperlipidemia Father   . Thyroid disease Mother   . Hypertension Sister   . Hyperlipidemia Sister   . Healthy Sister   . Healthy Daughter   . Healthy Son     Review of Systems  All other systems reviewed and are negative.   Exam:   BP 110/70   Pulse (!) 58   Temp 97.6 F (36.4 C) (Temporal)   Ht 5' 6.5" (1.689 m)   Wt 164 lb (74.4 kg)   BMI 26.07 kg/m   Height: 5' 6.5" (168.9 cm)  Ht  Readings from Last 3 Encounters:  08/14/18 5' 6.5" (1.689 m)  11/28/17 5' 6.75" (1.695 m)  06/06/17 5' 6.75" (1.695 m)   General appearance: alert, cooperative and appears stated age Head: Normocephalic, without obvious abnormality, atraumatic Neck: no adenopathy, supple, symmetrical, trachea midline and thyroid normal to inspection and palpation Lungs: clear to auscultation bilaterally Breasts: normal appearance, no masses or tenderness Heart: regular rate and rhythm Abdomen: soft, non-tender; bowel sounds normal; no masses,  no organomegaly Extremities: extremities normal, atraumatic, no cyanosis or edema Skin: Skin color, texture, turgor normal. No rashes or lesions Lymph nodes: Cervical, supraclavicular, and axillary nodes normal. No abnormal inguinal nodes palpated Neurologic: Grossly normal   Pelvic: External genitalia:  no lesions              Urethra:  normal appearing urethra with no masses, tenderness or lesions              Bartholins and Skenes: normal                 Vagina: normal appearing vagina with normal color and discharge, no lesions              Cervix: no lesions              Pap taken: Yes.   Bimanual Exam:  Uterus:  normal size, contour, position, consistency, mobility, non-tender              Adnexa: normal adnexa and no mass, fullness, tenderness               Rectovaginal: Confirms               Anus:  normal sphincter tone, no lesions  Chaperone was present for exam.  A:  Well Woman with normal exam PMP, no HRT Vasomotor symptoms H/O hypertension that resolved with weight loss Elevated lipids  P:   Mammogram guidelines reviewed.  Aware due. pap smear obtained today.  Negative HR HPV 12/18 CBC, CMP, Lipids, TSH, VitD Will have colonoscopy done after her birthday Does not need RF for gabapentin at this time return annually or prn

## 2018-08-15 LAB — COMPREHENSIVE METABOLIC PANEL
ALT: 43 IU/L — ABNORMAL HIGH (ref 0–32)
AST: 35 IU/L (ref 0–40)
Albumin/Globulin Ratio: 2 (ref 1.2–2.2)
Albumin: 4.9 g/dL — ABNORMAL HIGH (ref 3.8–4.8)
Alkaline Phosphatase: 46 IU/L (ref 39–117)
BUN/Creatinine Ratio: 19 (ref 9–23)
BUN: 15 mg/dL (ref 6–24)
Bilirubin Total: <0.2 mg/dL (ref 0.0–1.2)
CO2: 27 mmol/L (ref 20–29)
Calcium: 9.9 mg/dL (ref 8.7–10.2)
Chloride: 99 mmol/L (ref 96–106)
Creatinine, Ser: 0.8 mg/dL (ref 0.57–1.00)
GFR calc Af Amer: 100 mL/min/{1.73_m2} (ref >59)
GFR calc non Af Amer: 87 mL/min/{1.73_m2} (ref >59)
Globulin, Total: 2.5 g/dL (ref 1.5–4.5)
Glucose: 73 mg/dL (ref 65–99)
Potassium: 4.3 mmol/L (ref 3.5–5.2)
Sodium: 143 mmol/L (ref 134–144)
Total Protein: 7.4 g/dL (ref 6.0–8.5)

## 2018-08-15 LAB — CBC
Hematocrit: 39.9 % (ref 34.0–46.6)
Hemoglobin: 13.3 g/dL (ref 11.1–15.9)
MCH: 30.8 pg (ref 26.6–33.0)
MCHC: 33.3 g/dL (ref 31.5–35.7)
MCV: 92 fL (ref 79–97)
Platelets: 285 10*3/uL (ref 150–450)
RBC: 4.32 x10E6/uL (ref 3.77–5.28)
RDW: 12.6 % (ref 11.7–15.4)
WBC: 6.3 10*3/uL (ref 3.4–10.8)

## 2018-08-15 LAB — VITAMIN D 25 HYDROXY (VIT D DEFICIENCY, FRACTURES): Vit D, 25-Hydroxy: 32.4 ng/mL (ref 30.0–100.0)

## 2018-08-15 LAB — LIPID PANEL
Chol/HDL Ratio: 3.7 ratio (ref 0.0–4.4)
Cholesterol, Total: 266 mg/dL — ABNORMAL HIGH (ref 100–199)
HDL: 71 mg/dL (ref >39)
LDL Calculated: 169 mg/dL — ABNORMAL HIGH (ref 0–99)
Triglycerides: 131 mg/dL (ref 0–149)
VLDL Cholesterol Cal: 26 mg/dL (ref 5–40)

## 2018-08-15 LAB — TSH: TSH: 8.49 u[IU]/mL — ABNORMAL HIGH (ref 0.450–4.500)

## 2018-08-17 ENCOUNTER — Other Ambulatory Visit: Payer: Self-pay | Admitting: Obstetrics & Gynecology

## 2018-08-17 DIAGNOSIS — Z1231 Encounter for screening mammogram for malignant neoplasm of breast: Secondary | ICD-10-CM

## 2018-08-17 LAB — CYTOLOGY - PAP: Diagnosis: NEGATIVE

## 2018-08-21 NOTE — Addendum Note (Signed)
Addended by: [name] on: 08/21/2018 07:23 AM   Modules accepted: Orders

## 2018-09-14 ENCOUNTER — Other Ambulatory Visit: Payer: 59

## 2018-09-15 ENCOUNTER — Other Ambulatory Visit: Payer: Self-pay

## 2018-09-15 ENCOUNTER — Other Ambulatory Visit (INDEPENDENT_AMBULATORY_CARE_PROVIDER_SITE_OTHER): Payer: 59

## 2018-09-15 DIAGNOSIS — R7989 Other specified abnormal findings of blood chemistry: Secondary | ICD-10-CM

## 2018-09-15 DIAGNOSIS — R748 Abnormal levels of other serum enzymes: Secondary | ICD-10-CM | POA: Diagnosis not present

## 2018-09-17 LAB — TSH: TSH: 8.16 u[IU]/mL — ABNORMAL HIGH (ref 0.450–4.500)

## 2018-09-17 LAB — HEPATIC FUNCTION PANEL
ALT: 71 IU/L — ABNORMAL HIGH (ref 0–32)
AST: 36 IU/L (ref 0–40)
Albumin: 5 g/dL — ABNORMAL HIGH (ref 3.8–4.8)
Alkaline Phosphatase: 54 IU/L (ref 39–117)
Bilirubin Total: <0.2 mg/dL (ref 0.0–1.2)
Bilirubin, Direct: 0.06 mg/dL (ref 0.00–0.40)
Total Protein: 7.4 g/dL (ref 6.0–8.5)

## 2018-09-17 LAB — T4, FREE: Free T4: 0.92 ng/dL (ref 0.82–1.77)

## 2018-09-22 ENCOUNTER — Telehealth: Payer: Self-pay | Admitting: *Deleted

## 2018-09-22 ENCOUNTER — Encounter: Payer: Self-pay | Admitting: Obstetrics & Gynecology

## 2018-09-22 DIAGNOSIS — R748 Abnormal levels of other serum enzymes: Secondary | ICD-10-CM

## 2018-09-22 DIAGNOSIS — R7989 Other specified abnormal findings of blood chemistry: Secondary | ICD-10-CM

## 2018-09-22 NOTE — Telephone Encounter (Signed)
Call to patient. Advised of results and recommendations from Dr Sabra Heck.  Patient agreeable to referral to endocrinology and GI. Requests [place] and female provider if possible.  Advised referral department will contact her but can call back if no response in 1 week.   Encounter closed.

## 2018-09-22 NOTE — Telephone Encounter (Signed)
-----   Message from [name], MD sent at 09/22/2018  8:31 AM EDT ----- Please let pt know her TSH is still elevated but her free T4 (the active thyroid hormone measurment) is normal.  So, she does not have hypothyroidism.  However, I do not have a good explination for why her TSH is still elevated.  I would like for her to seen endocrinologist this year.  I will place referral if she is ok with this.  Also, her repeat liver enzymes are higher--43 to 71.  I would like her to see GI as well.  I will make a referral if she does not have a preference.  Thanks.

## 2018-09-22 NOTE — Telephone Encounter (Signed)
Good morning,  I had some blood work on 6-30 and have not heard any results. Thank you

## 2018-10-06 ENCOUNTER — Ambulatory Visit
Admission: RE | Admit: 2018-10-06 | Discharge: 2018-10-06 | Disposition: A | Discharge status: Home / Self Care | Payer: 59 | Source: Ambulatory Visit | Attending: Obstetrics & Gynecology | Admitting: Obstetrics & Gynecology

## 2018-10-06 ENCOUNTER — Other Ambulatory Visit: Payer: Self-pay

## 2018-10-06 DIAGNOSIS — Z1231 Encounter for screening mammogram for malignant neoplasm of breast: Secondary | ICD-10-CM | POA: Diagnosis not present

## 2018-10-08 ENCOUNTER — Other Ambulatory Visit: Payer: Self-pay

## 2018-10-08 ENCOUNTER — Ambulatory Visit (INDEPENDENT_AMBULATORY_CARE_PROVIDER_SITE_OTHER): Payer: 59 | Admitting: Internal Medicine

## 2018-10-08 ENCOUNTER — Encounter: Payer: Self-pay | Admitting: Internal Medicine

## 2018-10-08 VITALS — BP 118/72 | HR 54 | Temp 98.1°F | Ht 67.0 in | Wt 167.7 lb

## 2018-10-08 DIAGNOSIS — E039 Hypothyroidism, unspecified: Secondary | ICD-10-CM

## 2018-10-08 DIAGNOSIS — E038 Other specified hypothyroidism: Secondary | ICD-10-CM

## 2018-10-08 LAB — T4, FREE: Free T4: 0.69 ng/dL (ref 0.60–1.60)

## 2018-10-08 LAB — TSH: TSH: 8.04 u[IU]/mL — ABNORMAL HIGH (ref 0.35–4.50)

## 2018-10-08 NOTE — Progress Notes (Signed)
Name: Angela Wells  MRN/ DOB: [id number], Jun 23, 1970    Age/ Sex: 48 y.o., female    PCP: Esaw Grandchild, NP   Reason for Endocrinology Evaluation: Subclinical hypothyroidism     Date of Initial Endocrinology Evaluation: 10/08/2018     HPI: [name] is a 48 y.o. female with a past medical history of Dyslipidemia. The patient presented for initial endocrinology clinic visit on 10/08/2018 for consultative assistance with her subclinical hypothyroidism  Pt noted to have an elevated TSH at 8.490 uIU/mL with normal FT4 during routine labs in 07/2018. Repeat labs a month later confirmed similar results.    Today she admits to chronic fatigue , occasional constipation and depression that have been chronic in nature. She has noted dry hair but no thinning, she noted thinning of the eye brows.   Denies any local neck symptoms.  No biotin except for what's in MVI    Mother with Hashimoto's    HISTORY:  Past Medical History:  Past Medical History:  Diagnosis Date  . Abnormal Pap smear   . Anxiety   . Arthritis    hands  . Depression   . Dyslipidemia with elevated low density lipoprotein (LDL) cholesterol and abnormally low high density lipoprotein cholesterol    diet controlled  . Eczema   . Hypertension    resolved - lost weight, no med  . SVD (spontaneous vaginal delivery)    x 2   Past Surgical History:  Past Surgical History:  Procedure Laterality Date  . DILATATION & CURETTAGE/HYSTEROSCOPY WITH MYOSURE N/A 06/16/2015   Procedure: DILATATION & CURETTAGE/HYSTEROSCOPY WITH Novasure;  Surgeon: [name], MD;  Location: [facility];  Service: Gynecology;  Laterality: N/A;  . GYNECOLOGIC CRYOSURGERY    . IUD REMOVAL  6/12   in cx  . LAPAROSCOPIC TUBAL LIGATION  03/04/2011   Procedure: LAPAROSCOPIC TUBAL LIGATION;  Surgeon: Felipa Emory;  Location: [facility];  Service: Gynecology;  Laterality: Bilateral;  with filshie clips  . TUBAL LIGATION  03/04/2011   Procedure: ESSURE TUBAL STERILIZATION;  Surgeon: Felipa Emory;  Location: [facility];  Service: Gynecology;  Laterality: Bilateral;  Attempted  . WISDOM TOOTH EXTRACTION        Social History:  reports that she quit smoking about 20 years ago. Her smoking use included cigarettes. She has a 3.25 pack-year smoking history. She has never used smokeless tobacco. She reports current alcohol use. She reports that she does not use drugs.  Family History: family history includes Healthy in her daughter, sister, and son; Hyperlipidemia in her father and sister; Hypertension in her father and sister; Thyroid disease in her mother.   HOME MEDICATIONS: Allergies as of 10/08/2018   No Known Allergies     Medication List       Accurate as of October 08, 2018 11:30 AM. If you have any questions, ask your nurse or doctor.        5-HTP Caps Take by mouth daily.   acetaminophen 325 MG tablet Commonly known as: TYLENOL Take 650 mg by mouth every 6 (six) hours as needed for mild pain or headache.   BL EVENING PRIMROSE OIL PO Take 1 capsule by mouth 2 (two) times daily. Reported on 06/05/2015   BLACK COHOSH PO Take 1 tablet by mouth 2 (two) times daily. Reported on 06/05/2015   buPROPion 150 MG 24 hr tablet Commonly known as: WELLBUTRIN XL Take 0.5 tablets by mouth daily.   Catalytic Formula  Tabs Take 1 tablet by mouth daily. Reported on 06/05/2015   CLA 1000 MG Caps   gabapentin 100 MG capsule Commonly known as: NEURONTIN Take 100 mg by mouth at bedtime.   multivitamins ther. w/minerals Tabs tablet Take 1 tablet by mouth daily. Reported on 06/05/2015   Omega-3 Complex 192-251-11 MG-MG-UNIT Caps Generic drug: DHA-EPA-Vitamin E Take 2 capsules by mouth 2 (two) times daily. Reported on 06/05/2015   pyridOXINE 25 MG tablet Commonly known as: VITAMIN B-6 Take by mouth daily.   Vitamin B 12 100 MCG Lozg Take by mouth.   zolpidem 10 MG tablet Commonly known as: AMBIEN 1/2 to 1 tab qhs prn  insomnia         REVIEW OF SYSTEMS: A comprehensive ROS was conducted with the patient and is negative except as per HPI and below:  Review of Systems  Constitutional: Positive for malaise/fatigue. Negative for fever and weight loss.  HENT: Negative for congestion and sore throat.   Eyes: Negative for blurred vision and pain.  Respiratory: Negative for cough and shortness of breath.   Cardiovascular: Negative for chest pain and palpitations.  Gastrointestinal: Positive for constipation. Negative for nausea.  Genitourinary: Negative for frequency.  Skin: Negative.   Neurological: Negative for tingling and tremors.  Endo/Heme/Allergies: Negative for polydipsia.  Psychiatric/Behavioral: Positive for depression. The patient is not nervous/anxious.        OBJECTIVE:  VS: BP 118/72 (BP Location: Left Arm, Patient Position: Sitting, Cuff Size: Normal)   Pulse (!) 54   Temp 98.1 F (36.7 C)   Ht 5\' 7"  (1.702 m)   Wt 167 lb 11.2 oz (76.1 kg)   SpO2 96%   BMI 26.27 kg/m    Wt Readings from Last 3 Encounters:  10/08/18 167 lb 11.2 oz (76.1 kg)  08/14/18 164 lb (74.4 kg)  11/28/17 160 lb 6.4 oz (72.8 kg)     EXAM: General: Pt appears well and is in NAD  Hydration: Well-hydrated with moist mucous membranes and good skin turgor  Eyes: External eye exam normal without stare, lid lag or exophthalmos.  EOM intact.    Ears, Nose, Throat: Hearing: Grossly intact bilaterally Dental: Good dentition  Throat: Clear without mass, erythema or exudate  Neck: General: Supple without adenopathy. Thyroid: Thyroid size normal.  No goiter or nodules appreciated. No thyroid bruit.  Lungs: Clear with good BS bilat with no rales, rhonchi, or wheezes  Heart: Auscultation: RRR.  Abdomen: Normoactive bowel sounds, soft, nontender, without masses or organomegaly palpable  Extremities:  BL LE: No pretibial edema normal ROM and strength.  Skin: Hair: Texture and amount normal with gender appropriate  distribution Skin Inspection: No rashes. Skin Palpation: Skin temperature, texture, and thickness normal to palpation  Neuro: Cranial nerves: II - XII grossly intact  Motor: Normal strength throughout DTRs: 2+ and symmetric in UE without delay in relaxation phase  Mental Status: Judgment, insight: Intact Orientation: Oriented to time, place, and person Mood and affect: No depression, anxiety, or agitation     DATA REVIEWED:   Results for RETAJ, HILBUN (MRN [id number]) as of 10/08/2018 07:38  Ref. Range 08/14/2018 14:53 09/15/2018 13:27  TSH Latest Ref Range: 0.450 - 4.500 uIU/mL 8.490 (H) 8.160 (H)  T4,Free(Direct) Latest Ref Range: 0.82 - 1.77 ng/dL  0.92    ASSESSMENT/PLAN/RECOMMENDATIONS:   1. Subclinical Hypothyroidism:  - Pt with chronic non-specific symptoms that could be attributed to thyroid disease.  - Given her symptoms of fatigue and FH of Hashimoto's, she  will benefit from initiation of LT-4 replacement .  - - Pt educated extensively on the correct way to take levothyroxine (first thing in the morning with water, 30 minutes before eating or taking other medications). - Pt encouraged to double dose the following day if she were to miss a dose given long half-life of levothyroxine.    Medications :  Levothyroxine 50 mcg daily     Addendum: Discussed results with the pt on 10/09/2018 at 10:30 Am     Signed electronically by: Mack Guise, MD  [facility] Endocrinology  [facility] [address] [place], [place] [address] Phone: [phone] FAX: [phone]   CC: Esaw Grandchild, NP [place] [address] Phone: [phone] Fax: [phone]   Return to Endocrinology clinic as below: Future Appointments  Date Time Provider [place]  10/21/2018  9:30 AM Thornton Park, MD [facility]  11/26/2018  8:00 AM [facility] None  10/29/2019  3:00 PM [name], MD [place]  None

## 2018-10-08 NOTE — Patient Instructions (Signed)

## 2018-10-09 LAB — THYROID PEROXIDASE ANTIBODY: Thyroperoxidase Ab SerPl-aCnc: 885 IU/mL — ABNORMAL HIGH (ref <9)

## 2018-10-09 MED ORDER — LEVOTHYROXINE SODIUM 50 MCG PO TABS: 50.0000 ug | ORAL_TABLET | Freq: Every day | ORAL | 3 refills | Status: DC

## 2018-10-09 MED FILL — LEVOTHYROXINE 50 MCG TABLET: 50 | 90 days supply | Qty: 90 | Fill #0

## 2018-10-09 MED FILL — ZOLPIDEM TARTRATE 10 MG TAB: 10 | 30 days supply | Qty: 30 | Fill #1

## 2018-10-13 ENCOUNTER — Telehealth: Payer: Self-pay | Admitting: Obstetrics & Gynecology

## 2018-10-13 ENCOUNTER — Encounter: Payer: Self-pay | Admitting: Obstetrics & Gynecology

## 2018-10-13 NOTE — Telephone Encounter (Signed)
Per review of labs dated 09/15/18 repeat liver enzymes elevated from 43 to 71, GI referral was recommended.   Routing to Dr. Sabra Heck to review MyChart message and advise.

## 2018-10-13 NOTE — Telephone Encounter (Signed)
Patient sent the following message through [facility]. Routing to triage to assist patient with request.  Good morning Dr. Sabra Heck,  I have done some research and read that having hyperthyroidism may cause the liver enzymes to elevate. Is this correct? I am wondering if I can just have my liver enzymes rechecked in a few weeks instead of see the GI Phy. What are your thoughts?    Thanks  [employer]

## 2018-10-14 NOTE — Telephone Encounter (Signed)
Honestly, I've never seen that as a cause.  She can always decline a referral or not go at this time but I still think referral is a good idea.  Sorry not to be more helpful.

## 2018-10-14 NOTE — Telephone Encounter (Signed)
Spoke with patient, advised as seen below per Dr. Sabra Heck. Patient request to proceed with referral as scheduled. Patient thankful for return call.   Routing to provider for final review. Patient is agreeable to disposition. Will close encounter.

## 2018-10-15 ENCOUNTER — Encounter: Payer: Self-pay | Admitting: Internal Medicine

## 2018-10-20 NOTE — Progress Notes (Addendum)
Referring Provider: Esaw Grandchild, NP Primary Care Physician:  Esaw Grandchild, NP   Reason for Consultation:  Abdominal liver enzymes   IMPRESSION:  Abnormal liver enzymes - ALT 71 Recent diagnosis of Hashimoto Father with colon polyps No prior colon cancer screening  Suspected hepatocellular process. Increased ALT correlates with recent weight gain.  There is an association of elevated ALT with thyroid dysfunction.  She is also at risk for autoimmune hepatitis given her newly diagnosed Hashimoto's disease.  Recent testing for HBV and HCV would have been performed with blood donation making viral hepatitis less likely.  She also uses several herbal therapies that may be associated with abnormal liver tests. In particular, I recommend that she not use black cohosh as this is known to has liver enzyme abnormalities.  This risk increases in combination with valerian.  Will proceed with labs to evaluate for these other common causes of hepatocellular injury.  Will also proceed with ultrasound to evaluate the hepatic parenchyma.   I have recommended a colonoscopy.  Not only is she almost 48 but she has a family history of colon polyps.   PLAN: Labs today: fasting ferritin, fasting insulin, fasting glucose, iron, ANA, AMA, anti-smooth muscle antibody, IgG, IgM Abdominal ultrasound Avoid herbal medications - including black cohosh and valerian Follow-up encounter with me in 2-4 weeks to review these results Colonoscopy to be scheduled at her convenience  HPI: Angela Wells is a 48 y.o. female referred by NP [place] for abdominal liver enzymes.  History is obtained through the patient and review of her electronic health record.  She has anxiety, depression, hypercholesterolemia.  Annual blood work showed an elevated ALT. Confirmed to be high. TSH was also elevated.  Diagnosed with Hashimoto by Dr. Kelton Pillar.   ALT 22 11/10/14, ALT 18 07/04/16, ALT 43 08/14/18, ALT 71 09/15/18. Marland Kitchen No symptoms  associated with the elevation.  No identified exacerbating or relieving features.  Some increased weight while working remotely.  Has gained weight while working at home.  Doesn't sleep well. More pronounced fatigue.   Blood donation earlier this year.  No prior blood transfusion.  No history of jaundice or scleral icterus.  No history of use or experimentation with IV or intranasal street drugs. Drinks 1-2 light beer or seltzer weekly. No history of heavy drinking. Multiple tattoos - placed in the parlor 12 years ago, hip placed in the parlor 20 years.  No history of autoimmune disease.  No family history of liver disease.  Black cohosh for hot flashes. Valerium root for sleep. Wild yam root. She drinks 2-3 cups of coffee daily.   No prior abdominal imaging. No prior endoscopy.   Mother with hasimoto's. Father with peptic ulcer disease, colon polyps, required surgery for NSAID-related duodenal ulcers. No other family history.   Past Medical History:  Diagnosis Date   Abnormal Pap smear    Anxiety    Arthritis    hands   Depression    Dyslipidemia with elevated low density lipoprotein (LDL) cholesterol and abnormally low high density lipoprotein cholesterol    diet controlled   Eczema    Hypertension    resolved - lost weight, no med   SVD (spontaneous vaginal delivery)    x 2    Past Surgical History:  Procedure Laterality Date   DILATATION & CURETTAGE/HYSTEROSCOPY WITH MYOSURE N/A 06/16/2015   Procedure: DILATATION & CURETTAGE/HYSTEROSCOPY WITH Novasure;  Surgeon: [name], MD;  Location: [facility];  Service: Gynecology;  Laterality:  N/A;   GYNECOLOGIC CRYOSURGERY     IUD REMOVAL  6/12   in cx   LAPAROSCOPIC TUBAL LIGATION  03/04/2011   Procedure: LAPAROSCOPIC TUBAL LIGATION;  Surgeon: Felipa Emory;  Location: [facility];  Service: Gynecology;  Laterality: Bilateral;  with filshie clips   TUBAL LIGATION  03/04/2011   Procedure: ESSURE TUBAL STERILIZATION;   Surgeon: Felipa Emory;  Location: [facility];  Service: Gynecology;  Laterality: Bilateral;  Attempted   WISDOM TOOTH EXTRACTION      Prior to Admission medications   Medication Sig Start Date End Date Taking? Authorizing Provider  5-HTP CAPS Take by mouth daily.    [provider]  acetaminophen (TYLENOL) 325 MG tablet Take 650 mg by mouth every 6 (six) hours as needed for mild pain or headache.    [provider]  BL EVENING PRIMROSE OIL PO Take 1 capsule by mouth 2 (two) times daily. Reported on 06/05/2015    [provider]  BLACK COHOSH PO Take 1 tablet by mouth 2 (two) times daily. Reported on 06/05/2015    [provider]  buPROPion (WELLBUTRIN XL) 150 MG 24 hr tablet Take 0.5 tablets by mouth daily. 11/03/17   [provider]  Cyanocobalamin (VITAMIN B 12) 100 MCG LOZG Take by mouth.    [provider]  DHA-EPA-Vitamin E (OMEGA-3 COMPLEX) 192-251-11 MG-MG-UNIT CAPS Take 2 capsules by mouth 2 (two) times daily. Reported on 06/05/2015    [provider]  gabapentin (NEURONTIN) 100 MG capsule Take 100 mg by mouth at bedtime.    [provider]  levothyroxine (SYNTHROID) 50 MCG tablet Take 1 tablet (50 mcg total) by mouth daily. 10/09/18   Shamleffer, Melanie Crazier, MD  Multiple Vitamins-Minerals (MULTIVITAMINS THER. W/MINERALS) TABS Take 1 tablet by mouth daily. Reported on 06/05/2015    [provider]  Nutritional Supplements (CATALYTIC FORMULA) TABS Take 1 tablet by mouth daily. Reported on 06/05/2015    [provider]  pyridOXINE (VITAMIN B-6) 25 MG tablet Take by mouth daily.    [provider]  Safflower Oil (CLA) 1000 MG CAPS  12/16/16   [provider]  zolpidem (AMBIEN) 10 MG tablet 1/2 to 1 tab qhs prn insomnia 04/29/18   [name], MD    Current Outpatient Medications  Medication Sig Dispense Refill   5-HTP CAPS Take by mouth daily.     acetaminophen (TYLENOL) 325 MG  tablet Take 650 mg by mouth every 6 (six) hours as needed for mild pain or headache.     BL EVENING PRIMROSE OIL PO Take 1 capsule by mouth 2 (two) times daily. Reported on 06/05/2015     BLACK COHOSH PO Take 1 tablet by mouth 2 (two) times daily. Reported on 06/05/2015     buPROPion (WELLBUTRIN XL) 150 MG 24 hr tablet Take 0.5 tablets by mouth daily.  2   Cyanocobalamin (VITAMIN B 12) 100 MCG LOZG Take by mouth.     DHA-EPA-Vitamin E (OMEGA-3 COMPLEX) 192-251-11 MG-MG-UNIT CAPS Take 2 capsules by mouth 2 (two) times daily. Reported on 06/05/2015     gabapentin (NEURONTIN) 100 MG capsule Take 100 mg by mouth at bedtime.     levothyroxine (SYNTHROID) 50 MCG tablet Take 1 tablet (50 mcg total) by mouth daily. 90 tablet 3   Multiple Vitamins-Minerals (MULTIVITAMINS THER. W/MINERALS) TABS Take 1 tablet by mouth daily. Reported on 06/05/2015     Nutritional Supplements (CATALYTIC FORMULA) TABS Take 1 tablet by mouth daily. Reported  on 06/05/2015     pyridOXINE (VITAMIN B-6) 25 MG tablet Take by mouth daily.     Safflower Oil (CLA) 1000 MG CAPS      zolpidem (AMBIEN) 10 MG tablet 1/2 to 1 tab qhs prn insomnia 30 tablet 1   No current facility-administered medications for this visit.     Allergies as of 10/21/2018   (No Known Allergies)    Family History  Problem Relation Age of Onset   Hypertension Father    Hyperlipidemia Father    Thyroid disease Mother    Hypertension Sister    Hyperlipidemia Sister    Healthy Sister    Healthy Daughter    Healthy Son     Social History   Socioeconomic History   Marital status: Married    Spouse name: Not on file   Number of children: Not on file   Years of education: Not on file   Highest education level: Not on file  Occupational History   Not on file  Social Needs   Financial resource strain: Not on file   Food insecurity    Worry: Not on file    Inability: Not on file   Transportation needs    Medical: Not on  file    Non-medical: Not on file  Tobacco Use   Smoking status: Former Smoker    Packs/day: 0.25    Years: 13.00    Pack years: 3.25    Types: Cigarettes    Quit date: 03/18/1998    Years since quitting: 20.6   Smokeless tobacco: Never Used  Substance and Sexual Activity   Alcohol use: Yes    Alcohol/week: 0.0 - 1.0 standard drinks   Drug use: No   Sexual activity: Yes    Partners: Male    Birth control/protection: Surgical    Comment: BTL/Ablation  Lifestyle   Physical activity    Days per week: Not on file    Minutes per session: Not on file   Stress: Not on file  Relationships   Social connections    Talks on phone: Not on file    Gets together: Not on file    Attends religious service: Not on file    Active member of club or organization: Not on file    Attends meetings of clubs or organizations: Not on file    Relationship status: Not on file   Intimate partner violence    Fear of current or ex partner: Not on file    Emotionally abused: Not on file    Physically abused: Not on file    Forced sexual activity: Not on file  Other Topics Concern   Not on file  Social History Narrative   Not on file    Review of Systems: 12 system ROS is negative except as noted above.   Physical Exam: Vital signs in last 24 hours: @VSRANGES @   General:   Alert, in NAD. No scleral icterus. No bilateral temporal wasting.  Heart:  Regular rate and rhythm; no murmurs Pulm: Clear anteriorly; no wheezing Abdomen:  Soft. Nontender. Nondistended. Normal bowel sounds. No rebound or guarding. No fluid wave.  LAD: No inguinal or umbilical LAD Extremities:  Without edema. Neurologic:  Alert and  oriented x4;  grossly normal neurologically; no asterixis or clonus. Skin: No jaundice. No palmar erythema or spider angioma.  Psych:  Alert and cooperative. Normal mood and affect.   Annalycia Done L. Tarri Glenn, MD, MPH 10/20/2018, 9:17 PM

## 2018-10-21 ENCOUNTER — Ambulatory Visit (INDEPENDENT_AMBULATORY_CARE_PROVIDER_SITE_OTHER): Payer: 59 | Admitting: Gastroenterology

## 2018-10-21 ENCOUNTER — Encounter: Payer: Self-pay | Admitting: Gastroenterology

## 2018-10-21 VITALS — BP 112/70 | HR 68 | Temp 98.1°F | Ht 66.5 in | Wt 168.8 lb

## 2018-10-21 DIAGNOSIS — Z1211 Encounter for screening for malignant neoplasm of colon: Secondary | ICD-10-CM

## 2018-10-21 DIAGNOSIS — R748 Abnormal levels of other serum enzymes: Secondary | ICD-10-CM

## 2018-10-21 NOTE — Patient Instructions (Addendum)
I have recommended some labs and an ultrasound to evaluate your elevated liver enzymes.  Let's plan to follow-up in 2-4 weeks to review these results.   You have been scheduled for an abdominal ultrasound at [facility] Radiology (1st floor of hospital) on 10-28-2018 at 8:00 am. Please arrive 15 minutes prior to your appointment for registration. Make certain not to have anything to eat or drink 6 hours prior to your appointment. Should you need to reschedule your appointment, please contact radiology at [phone]. This test typically takes about 30 minutes to perform.  It has been recommended to you by your physician that you have a(n) colonoscopy completed. Per your request, we did not schedule the procedure(s) today. Please contact our office at [phone] should you decide to have the procedure completed.

## 2018-10-28 ENCOUNTER — Ambulatory Visit (HOSPITAL_COMMUNITY)
Admission: RE | Admit: 2018-10-28 | Discharge: 2018-10-28 | Disposition: A | Discharge status: Home / Self Care | Payer: 59 | Source: Ambulatory Visit | Attending: Gastroenterology | Admitting: Gastroenterology

## 2018-10-28 ENCOUNTER — Other Ambulatory Visit: Payer: Self-pay

## 2018-10-28 DIAGNOSIS — R945 Abnormal results of liver function studies: Secondary | ICD-10-CM | POA: Diagnosis not present

## 2018-10-28 DIAGNOSIS — R748 Abnormal levels of other serum enzymes: Secondary | ICD-10-CM | POA: Diagnosis not present

## 2018-10-28 LAB — GLUCOSE, RANDOM: Glucose, Bld: 92 mg/dL (ref 70–99)

## 2018-10-28 LAB — IRON: Iron: 120 ug/dL (ref 28–170)

## 2018-10-28 LAB — FERRITIN: Ferritin: 68 ng/mL (ref 11–307)

## 2018-10-29 LAB — IGG: IgG (Immunoglobin G), Serum: 1038 mg/dL (ref 586–1602)

## 2018-10-29 LAB — IGM: IgM (Immunoglobulin M), Srm: 73 mg/dL (ref 26–217)

## 2018-10-29 LAB — MITOCHONDRIAL ANTIBODIES: Mitochondrial M2 Ab, IgG: <20 Units (ref 0.0–20.0)

## 2018-10-29 LAB — ANA W/REFLEX: Anti Nuclear Antibody (ANA): NEGATIVE

## 2018-10-29 LAB — IGA: IgA: 154 mg/dL (ref 87–352)

## 2018-11-01 LAB — MISC LABCORP TEST (SEND OUT): Labcorp test code: 501561

## 2018-11-09 ENCOUNTER — Encounter: Payer: Self-pay | Admitting: Gastroenterology

## 2018-11-09 ENCOUNTER — Ambulatory Visit: Payer: 59 | Admitting: Gastroenterology

## 2018-11-09 VITALS — BP 114/70 | HR 80 | Temp 97.7°F | Ht 67.0 in | Wt 168.0 lb

## 2018-11-09 DIAGNOSIS — Z8371 Family history of colonic polyps: Secondary | ICD-10-CM

## 2018-11-09 DIAGNOSIS — R748 Abnormal levels of other serum enzymes: Secondary | ICD-10-CM | POA: Diagnosis not present

## 2018-11-09 DIAGNOSIS — E063 Autoimmune thyroiditis: Secondary | ICD-10-CM

## 2018-11-09 DIAGNOSIS — Z1211 Encounter for screening for malignant neoplasm of colon: Secondary | ICD-10-CM

## 2018-11-09 NOTE — Progress Notes (Signed)
Referring Provider: Esaw Grandchild, NP Primary Care Physician:  Esaw Grandchild, NP   Reason for Consultation: Abnormal ALT  IMPRESSION:  Abnormal liver enzymes - ALT 71    -Hepatitis B and hepatitis C serologies negative based on blood donation earlier this year    -Labs for hemochromatosis, autoimmune hepatitis, and insulin resistance as a surrogate for fatty liver are negative       -Calculated Homa IR 1.6 Recent diagnosis of [place] Father with colon polyps No prior colon cancer screening  Labs and ultrasound are nondiagnostic for the cause of her elevated ALT.  Findings may be due to drug-induced liver injury related to herbal therapies or recent thyroid dysfunction.  Plan celiac testing if the ALT does not normalize with treatment of her thyroid dysfunction.   I have again recommended a colonoscopy.  Not only is she almost 39 but she has a family history of colon polyps.   PLAN: Colonoscopy to be scheduled at her convenience Minimize use of nonprescription therapies Follow-up in 6 months - will need hepatic function panel if not done prior to that time Proceed with testing for celiac if her ALT does not normalize as her TSH improves  HPI: Angela Wells is a 47 y.o. female who is under evaluation of abdominal liver enzymes.  Interval history is obtained through the patient and review of her electronic health record.  She has anxiety, depression, hypercholesterolemia.  Annual blood work showed an elevated ALT. Confirmed to be high. TSH was also elevated.  Diagnosed with Hashimoto by Dr. Kelton Pillar.   11/10/14: ALT 22 07/04/16: ALT 18 08/14/18: ALT 43 09/15/18: ALT 71 Labs 10/08/18: TSH 8.04 I personally reviewed her abdominal ultrasound images from 10/28/18: normal study; no echogenic liver.  No evidence for portal hypertension. Labs 10/28/18: iron 120, ferritin 68, IgG 1038, IgM 73, glucose 92, ANA neg, AMA <20, HOMA-IR 1.6  No symptoms associated with her elevated ALT.  Will  have her thyroid rechecked in September after being on treatment for 6 weeks. Slightly more energy, no other changes noted.   Black cohosh for hot flashes. Valerium root for sleep. Wild yam root. She drinks 2-3 cups of coffee daily.   No prior endoscopy.    Mother with hasimoto's. Father with peptic ulcer disease, colon polyps, required surgery for NSAID-related duodenal ulcers. No other family history.   Past Medical History:  Diagnosis Date  . Abnormal Pap smear   . Anxiety   . Arthritis    hands  . Depression   . Dyslipidemia with elevated low density lipoprotein (LDL) cholesterol and abnormally low high density lipoprotein cholesterol    diet controlled  . Eczema   . Hypertension    resolved - lost weight, no med  . Hyperthyroidism   . SVD (spontaneous vaginal delivery)    x 2    Past Surgical History:  Procedure Laterality Date  . DILATATION & CURETTAGE/HYSTEROSCOPY WITH MYOSURE N/A 06/16/2015   Procedure: DILATATION & CURETTAGE/HYSTEROSCOPY WITH Novasure;  Surgeon: [name], MD;  Location: [facility];  Service: Gynecology;  Laterality: N/A;  . GYNECOLOGIC CRYOSURGERY    . IUD REMOVAL  6/12   in cx  . LAPAROSCOPIC TUBAL LIGATION  03/04/2011   Procedure: LAPAROSCOPIC TUBAL LIGATION;  Surgeon: Felipa Emory;  Location: [facility];  Service: Gynecology;  Laterality: Bilateral;  with filshie clips  . TUBAL LIGATION  03/04/2011   Procedure: ESSURE TUBAL STERILIZATION;  Surgeon: Felipa Emory;  Location: [facility];  Service: Gynecology;  Laterality: Bilateral;  Attempted  . WISDOM TOOTH EXTRACTION      Prior to Admission medications   Medication Sig Start Date End Date Taking? Authorizing Provider  5-HTP CAPS Take by mouth daily.    [provider]  acetaminophen (TYLENOL) 325 MG tablet Take 650 mg by mouth every 6 (six) hours as needed for mild pain or headache.    [provider]  BL EVENING PRIMROSE OIL PO Take 1 capsule by mouth 2 (two) times daily.  Reported on 06/05/2015    [provider]  BLACK COHOSH PO Take 1 tablet by mouth 2 (two) times daily. Reported on 06/05/2015    [provider]  buPROPion (WELLBUTRIN XL) 150 MG 24 hr tablet Take 0.5 tablets by mouth daily. 11/03/17   [provider]  Cyanocobalamin (VITAMIN B 12) 100 MCG LOZG Take by mouth.    [provider]  DHA-EPA-Vitamin E (OMEGA-3 COMPLEX) 192-251-11 MG-MG-UNIT CAPS Take 2 capsules by mouth 2 (two) times daily. Reported on 06/05/2015    [provider]  gabapentin (NEURONTIN) 100 MG capsule Take 100 mg by mouth at bedtime.    [provider]  levothyroxine (SYNTHROID) 50 MCG tablet Take 1 tablet (50 mcg total) by mouth daily. 10/09/18   Shamleffer, Melanie Crazier, MD  Multiple Vitamins-Minerals (MULTIVITAMINS THER. W/MINERALS) TABS Take 1 tablet by mouth daily. Reported on 06/05/2015    [provider]  Nutritional Supplements (CATALYTIC FORMULA) TABS Take 1 tablet by mouth daily. Reported on 06/05/2015    [provider]  pyridOXINE (VITAMIN B-6) 25 MG tablet Take by mouth daily.    [provider]  Safflower Oil (CLA) 1000 MG CAPS  12/16/16   [provider]  zolpidem (AMBIEN) 10 MG tablet 1/2 to 1 tab qhs prn insomnia 04/29/18   [name], MD    Current Outpatient Medications  Medication Sig Dispense Refill  . 5-HTP CAPS Take by mouth daily.    Marland Kitchen acetaminophen (TYLENOL) 325 MG tablet Take 650 mg by mouth every 6 (six) hours as needed for mild pain or headache.    . BL EVENING PRIMROSE OIL PO Take 1 capsule by mouth 2 (two) times daily. Reported on 06/05/2015    . BLACK COHOSH PO Take 1 tablet by mouth 2 (two) times daily. Reported on 06/05/2015    . buPROPion (WELLBUTRIN XL) 150 MG 24 hr tablet Take 0.5 tablets by mouth daily.  2  . Cyanocobalamin (VITAMIN B 12) 100 MCG LOZG Take by mouth.    . DHA-EPA-Vitamin E (OMEGA-3 COMPLEX) 192-251-11 MG-MG-UNIT CAPS Take 2 capsules by mouth 2  (two) times daily. Reported on 06/05/2015    . gabapentin (NEURONTIN) 100 MG capsule Take 100 mg by mouth at bedtime.    Marland Kitchen levothyroxine (SYNTHROID) 50 MCG tablet Take 1 tablet (50 mcg total) by mouth daily. 90 tablet 3  . Multiple Vitamins-Minerals (MULTIVITAMINS THER. W/MINERALS) TABS Take 1 tablet by mouth daily. Reported on 06/05/2015    . Nutritional Supplements (CATALYTIC FORMULA) TABS Take 1 tablet by mouth daily. Reported on 06/05/2015    . pyridOXINE (VITAMIN B-6) 25 MG tablet Take by mouth daily.    . Safflower Oil (CLA) 1000 MG CAPS     . zolpidem (AMBIEN) 10 MG tablet 1/2 to 1 tab qhs prn insomnia 30 tablet 1   No current facility-administered medications for this visit.     Allergies as of 11/09/2018  . (No Known Allergies)  Family History  Problem Relation Age of Onset  . Hypertension Father   . Hyperlipidemia Father   . Thyroid disease Mother   . Hypertension Sister   . Hyperlipidemia Sister   . Healthy Sister   . Healthy Daughter   . Healthy Son     Social History   Socioeconomic History  . Marital status: Married    Spouse name: Not on file  . Number of children: Not on file  . Years of education: Not on file  . Highest education level: Not on file  Occupational History  . Not on file  Social Needs  . Financial resource strain: Not on file  . Food insecurity    Worry: Not on file    Inability: Not on file  . Transportation needs    Medical: Not on file    Non-medical: Not on file  Tobacco Use  . Smoking status: Former Smoker    Packs/day: 0.25    Years: 13.00    Pack years: 3.25    Types: Cigarettes    Quit date: 03/18/1998    Years since quitting: 20.6  . Smokeless tobacco: Never Used  Substance and Sexual Activity  . Alcohol use: Yes    Alcohol/week: 0.0 - 1.0 standard drinks  . Drug use: No  . Sexual activity: Yes    Partners: Male    Birth control/protection: Surgical    Comment: BTL/Ablation  Lifestyle  . Physical activity    Days  per week: Not on file    Minutes per session: Not on file  . Stress: Not on file  Relationships  . Social Herbalist on phone: Not on file    Gets together: Not on file    Attends religious service: Not on file    Active member of club or organization: Not on file    Attends meetings of clubs or organizations: Not on file    Relationship status: Not on file  . Intimate partner violence    Fear of current or ex partner: Not on file    Emotionally abused: Not on file    Physically abused: Not on file    Forced sexual activity: Not on file  Other Topics Concern  . Not on file  Social History Narrative  . Not on file      General:   Alert, in NAD. No scleral icterus. No bilateral temporal wasting.  Abdomen:  Soft. Nontender. Nondistended. Normal bowel sounds. No rebound or guarding. No fluid wave.  LAD: No inguinal or umbilical LAD Extremities:  Without edema. Skin: No jaundice. No palmar erythema or spider angioma.  Psych:  Alert and cooperative. Normal mood and affect.   Gleen Ripberger L. Tarri Glenn, MD, MPH 11/09/2018, 10:41 AM

## 2018-11-09 NOTE — Patient Instructions (Signed)
Your recent labs and ultrasound were negative. Your ALT may be elevated due to thyroid dysfunction. Hopefully your ALT will normalize as your thyroid function normalizes.   I have recommended a colonoscopy at your convenience. This is even more important given your family history of colon polyps.

## 2018-11-17 ENCOUNTER — Encounter: Payer: Self-pay | Admitting: Internal Medicine

## 2018-11-19 ENCOUNTER — Other Ambulatory Visit: Payer: Self-pay | Admitting: Internal Medicine

## 2018-11-19 DIAGNOSIS — E039 Hypothyroidism, unspecified: Secondary | ICD-10-CM

## 2018-11-19 DIAGNOSIS — E038 Other specified hypothyroidism: Secondary | ICD-10-CM

## 2018-11-26 ENCOUNTER — Other Ambulatory Visit (INDEPENDENT_AMBULATORY_CARE_PROVIDER_SITE_OTHER): Payer: 59

## 2018-11-26 ENCOUNTER — Other Ambulatory Visit: Payer: Self-pay

## 2018-11-26 DIAGNOSIS — E039 Hypothyroidism, unspecified: Secondary | ICD-10-CM

## 2018-11-26 DIAGNOSIS — E038 Other specified hypothyroidism: Secondary | ICD-10-CM

## 2018-11-26 LAB — T4, FREE: Free T4: 0.87 ng/dL (ref 0.60–1.60)

## 2018-11-26 LAB — TSH: TSH: 3.81 u[IU]/mL (ref 0.35–4.50)

## 2018-12-20 ENCOUNTER — Emergency Department (HOSPITAL_COMMUNITY): Payer: 59

## 2018-12-20 ENCOUNTER — Encounter (HOSPITAL_COMMUNITY): Payer: Self-pay | Admitting: Emergency Medicine

## 2018-12-20 ENCOUNTER — Other Ambulatory Visit: Payer: Self-pay

## 2018-12-20 ENCOUNTER — Emergency Department (HOSPITAL_COMMUNITY)
Admission: EM | Admit: 2018-12-20 | Discharge: 2018-12-20 | Disposition: A | Discharge status: Home / Self Care | Payer: 59 | Attending: Emergency Medicine | Admitting: Emergency Medicine

## 2018-12-20 DIAGNOSIS — Z87891 Personal history of nicotine dependence: Secondary | ICD-10-CM | POA: Diagnosis not present

## 2018-12-20 DIAGNOSIS — R1011 Right upper quadrant pain: Secondary | ICD-10-CM | POA: Diagnosis not present

## 2018-12-20 DIAGNOSIS — Z79899 Other long term (current) drug therapy: Secondary | ICD-10-CM | POA: Insufficient documentation

## 2018-12-20 DIAGNOSIS — M79605 Pain in left leg: Secondary | ICD-10-CM | POA: Diagnosis not present

## 2018-12-20 DIAGNOSIS — R52 Pain, unspecified: Secondary | ICD-10-CM | POA: Diagnosis not present

## 2018-12-20 DIAGNOSIS — I10 Essential (primary) hypertension: Secondary | ICD-10-CM | POA: Diagnosis not present

## 2018-12-20 DIAGNOSIS — W5512XA Struck by horse, initial encounter: Secondary | ICD-10-CM | POA: Diagnosis not present

## 2018-12-20 DIAGNOSIS — M79662 Pain in left lower leg: Secondary | ICD-10-CM | POA: Diagnosis not present

## 2018-12-20 DIAGNOSIS — S3991XA Unspecified injury of abdomen, initial encounter: Secondary | ICD-10-CM | POA: Diagnosis not present

## 2018-12-20 DIAGNOSIS — I959 Hypotension, unspecified: Secondary | ICD-10-CM | POA: Diagnosis not present

## 2018-12-20 DIAGNOSIS — S8992XA Unspecified injury of left lower leg, initial encounter: Secondary | ICD-10-CM | POA: Diagnosis not present

## 2018-12-20 DIAGNOSIS — W19XXXA Unspecified fall, initial encounter: Secondary | ICD-10-CM | POA: Diagnosis not present

## 2018-12-20 LAB — I-STAT CHEM 8, ED
BUN: 14 mg/dL (ref 6–20)
Calcium, Ion: 1.16 mmol/L (ref 1.15–1.40)
Chloride: 104 mmol/L (ref 98–111)
Creatinine, Ser: 0.8 mg/dL (ref 0.44–1.00)
Glucose, Bld: 92 mg/dL (ref 70–99)
HCT: 40 % (ref 36.0–46.0)
Hemoglobin: 13.6 g/dL (ref 12.0–15.0)
Potassium: 3.8 mmol/L (ref 3.5–5.1)
Sodium: 141 mmol/L (ref 135–145)
TCO2: 26 mmol/L (ref 22–32)

## 2018-12-20 LAB — CBC WITH DIFFERENTIAL/PLATELET
Abs Immature Granulocytes: 0.05 10*3/uL (ref 0.00–0.07)
Basophils Absolute: 0.1 10*3/uL (ref 0.0–0.1)
Basophils Relative: 1 %
Eosinophils Absolute: 0.3 10*3/uL (ref 0.0–0.5)
Eosinophils Relative: 3 %
HCT: 40.5 % (ref 36.0–46.0)
Hemoglobin: 13.9 g/dL (ref 12.0–15.0)
Immature Granulocytes: 1 %
Lymphocytes Relative: 18 %
Lymphs Abs: 1.9 10*3/uL (ref 0.7–4.0)
MCH: 31.9 pg (ref 26.0–34.0)
MCHC: 34.3 g/dL (ref 30.0–36.0)
MCV: 92.9 fL (ref 80.0–100.0)
Monocytes Absolute: 0.7 10*3/uL (ref 0.1–1.0)
Monocytes Relative: 7 %
Neutro Abs: 7.8 10*3/uL — ABNORMAL HIGH (ref 1.7–7.7)
Neutrophils Relative %: 70 %
Platelets: 254 10*3/uL (ref 150–400)
RBC: 4.36 MIL/uL (ref 3.87–5.11)
RDW: 12 % (ref 11.5–15.5)
WBC: 10.9 10*3/uL — ABNORMAL HIGH (ref 4.0–10.5)
nRBC: 0 % (ref 0.0–0.2)

## 2018-12-20 LAB — COMPREHENSIVE METABOLIC PANEL
ALT: 71 U/L — ABNORMAL HIGH (ref 0–44)
AST: 48 U/L — ABNORMAL HIGH (ref 15–41)
Albumin: 4.2 g/dL (ref 3.5–5.0)
Alkaline Phosphatase: 38 U/L (ref 38–126)
Anion gap: 10 (ref 5–15)
BUN: 13 mg/dL (ref 6–20)
CO2: 26 mmol/L (ref 22–32)
Calcium: 9.4 mg/dL (ref 8.9–10.3)
Chloride: 103 mmol/L (ref 98–111)
Creatinine, Ser: 0.93 mg/dL (ref 0.44–1.00)
GFR calc Af Amer: >60 mL/min (ref >60)
GFR calc non Af Amer: >60 mL/min (ref >60)
Glucose, Bld: 95 mg/dL (ref 70–99)
Potassium: 4 mmol/L (ref 3.5–5.1)
Sodium: 139 mmol/L (ref 135–145)
Total Bilirubin: 0.5 mg/dL (ref 0.3–1.2)
Total Protein: 7.2 g/dL (ref 6.5–8.1)

## 2018-12-20 MED ORDER — METHOCARBAMOL 500 MG PO TABS: 500.0000 mg | ORAL_TABLET | Freq: Two times a day (BID) | ORAL | 0 refills | Status: AC

## 2018-12-20 MED ORDER — HYDROMORPHONE HCL 1 MG/ML IJ SOLN
1.0000 mg | Freq: Once | INTRAMUSCULAR | Status: AC
  Administered 2018-12-20: 1 mg via INTRAVENOUS
  Filled 2018-12-20: qty 1

## 2018-12-20 MED ORDER — IOHEXOL 300 MG/ML  SOLN
100.0000 mL | Freq: Once | INTRAMUSCULAR | Status: AC | PRN
  Administered 2018-12-20: 100 mL via INTRAVENOUS

## 2018-12-20 MED ORDER — HYDROCODONE-ACETAMINOPHEN 5-325 MG PO TABS: 2.0000 | ORAL_TABLET | ORAL | 0 refills | Status: AC | PRN

## 2018-12-20 NOTE — ED Provider Notes (Signed)
[facility] EMERGENCY DEPARTMENT Provider Note   CSN: [id number] Arrival date & time: 12/20/18  1257     History   Chief Complaint Chief Complaint  Patient presents with  . Flank Pain  . Leg Pain    HPI Angela Wells is a 48 y.o. female with a past medical history of depression, hypertension presenting to ED with a chief complaint of right flank pain.  Just prior to arrival she was riding a horse when the horse slipped and caused her to fall down.  States the horse itself also slipped, tried to get up and stepped on her right flank area.  She complains of pain in the area as well as pain to her left calf.  She has not ambulated since then.  She denies any head injury or loss of conscious, numbness arms or legs, vomiting, vision changes, anticoagulant use, back pain or neck pain.     HPI  Past Medical History:  Diagnosis Date  . Abnormal Pap smear   . Anxiety   . Arthritis    hands  . Depression   . Dyslipidemia with elevated low density lipoprotein (LDL) cholesterol and abnormally low high density lipoprotein cholesterol    diet controlled  . Eczema   . Hypertension    resolved - lost weight, no med  . Hyperthyroidism   . SVD (spontaneous vaginal delivery)    x 2    Patient Active Problem List   Diagnosis Date Noted  . Symptoms, such as flushing, sleeplessness, headache, lack of concentration, associated with the menopause 04/16/2017  . Acute right hip pain 03/24/2017  . Dyslipidemia with elevated low density lipoprotein (LDL) cholesterol and abnormally low high density lipoprotein cholesterol 03/24/2017  . Insomnia 12/12/2016  . Onychomycosis 12/12/2016  . Healthcare maintenance 12/11/2016  . Arthritis 11/10/2014    Past Surgical History:  Procedure Laterality Date  . DILATATION & CURETTAGE/HYSTEROSCOPY WITH MYOSURE N/A 06/16/2015   Procedure: DILATATION & CURETTAGE/HYSTEROSCOPY WITH Novasure;  Surgeon: [name], MD;  Location: [facility];   Service: Gynecology;  Laterality: N/A;  . GYNECOLOGIC CRYOSURGERY    . IUD REMOVAL  6/12   in cx  . LAPAROSCOPIC TUBAL LIGATION  03/04/2011   Procedure: LAPAROSCOPIC TUBAL LIGATION;  Surgeon: Felipa Emory;  Location: [facility];  Service: Gynecology;  Laterality: Bilateral;  with filshie clips  . TUBAL LIGATION  03/04/2011   Procedure: ESSURE TUBAL STERILIZATION;  Surgeon: Felipa Emory;  Location: [facility];  Service: Gynecology;  Laterality: Bilateral;  Attempted  . WISDOM TOOTH EXTRACTION       OB History    Gravida  4   Para  2   Term      Preterm      AB  2   Living  2     SAB  1   TAB  1   Ectopic      Multiple      Live Births               Home Medications    Prior to Admission medications   Medication Sig Start Date End Date Taking? Authorizing Provider  5-HTP CAPS Take by mouth daily.    [provider]  acetaminophen (TYLENOL) 325 MG tablet Take 650 mg by mouth every 6 (six) hours as needed for mild pain or headache.    [provider]  BL EVENING PRIMROSE OIL PO Take 1 capsule by mouth 2 (two) times daily. Reported  on 06/05/2015    [provider]  BLACK COHOSH PO Take 1 tablet by mouth 2 (two) times daily. Reported on 06/05/2015    [provider]  buPROPion (WELLBUTRIN XL) 150 MG 24 hr tablet Take 0.5 tablets by mouth daily. 11/03/17   [provider]  Cyanocobalamin (VITAMIN B 12) 100 MCG LOZG Take by mouth.    [provider]  DHA-EPA-Vitamin E (OMEGA-3 COMPLEX) 192-251-11 MG-MG-UNIT CAPS Take 2 capsules by mouth 2 (two) times daily. Reported on 06/05/2015    [provider]  gabapentin (NEURONTIN) 100 MG capsule Take 100 mg by mouth at bedtime.    [provider]  levothyroxine (SYNTHROID) 50 MCG tablet Take 1 tablet (50 mcg total) by mouth daily. 10/09/18   Shamleffer, Melanie Crazier, MD  Multiple Vitamins-Minerals (MULTIVITAMINS THER. W/MINERALS) TABS Take 1 tablet by mouth  daily. Reported on 06/05/2015    [provider]  Nutritional Supplements (CATALYTIC FORMULA) TABS Take 1 tablet by mouth daily. Reported on 06/05/2015    [provider]  pyridOXINE (VITAMIN B-6) 25 MG tablet Take by mouth daily.    [provider]  Safflower Oil (CLA) 1000 MG CAPS  12/16/16   [provider]  zolpidem (AMBIEN) 10 MG tablet 1/2 to 1 tab qhs prn insomnia 04/29/18   [name], MD    Family History Family History  Problem Relation Age of Onset  . Hypertension Father   . Hyperlipidemia Father   . Thyroid disease Mother   . Hypertension Sister   . Hyperlipidemia Sister   . Healthy Sister   . Healthy Daughter   . Healthy Son     Social History Social History   Tobacco Use  . Smoking status: Former Smoker    Packs/day: 0.25    Years: 13.00    Pack years: 3.25    Types: Cigarettes    Quit date: 03/18/1998    Years since quitting: 20.7  . Smokeless tobacco: Never Used  Substance Use Topics  . Alcohol use: Yes    Alcohol/week: 0.0 - 1.0 standard drinks  . Drug use: No     Allergies   Patient has no known allergies.   Review of Systems Review of Systems  Constitutional: Negative for appetite change, chills and fever.  HENT: Negative for ear pain, rhinorrhea, sneezing and sore throat.   Eyes: Negative for photophobia and visual disturbance.  Respiratory: Negative for cough, chest tightness, shortness of breath and wheezing.   Cardiovascular: Negative for chest pain and palpitations.  Gastrointestinal: Negative for abdominal pain, blood in stool, constipation, diarrhea, nausea and vomiting.  Genitourinary: Positive for flank pain. Negative for dysuria, hematuria and urgency.  Musculoskeletal: Negative for myalgias.  Skin: Negative for rash.  Neurological: Negative for dizziness, weakness and light-headedness.     Physical Exam Updated Vital Signs BP 128/76   Pulse 93   Temp 97.9 F (36.6 C) (Oral)   Resp 15   Ht  5\' 7"  (1.702 m)   Wt 74.8 kg   SpO2 97%   BMI 25.84 kg/m   Physical Exam Vitals signs and nursing note reviewed.  Constitutional:      General: She is not in acute distress.    Appearance: She is well-developed.  HENT:     Head: Normocephalic and atraumatic.     Nose: Nose normal.  Eyes:     General: No scleral icterus.       Right eye: No discharge.  Left eye: No discharge.     Conjunctiva/sclera: Conjunctivae normal.     Pupils: Pupils are equal, round, and reactive to light.  Neck:     Musculoskeletal: Normal range of motion and neck supple.  Cardiovascular:     Rate and Rhythm: Normal rate and regular rhythm.     Heart sounds: Normal heart sounds. No murmur. No friction rub. No gallop.   Pulmonary:     Effort: Pulmonary effort is normal. No respiratory distress.     Breath sounds: Normal breath sounds.  Abdominal:     General: Bowel sounds are normal. There is no distension.     Palpations: Abdomen is soft.     Tenderness: There is abdominal tenderness. There is no guarding.     Comments: Large area of ecchymosis noted on the right flank with surrounding tenderness to palpation.  Musculoskeletal: Normal range of motion.     Comments: No midline spinal tenderness present in lumbar, thoracic or cervical spine. No step-off palpated. No visible bruising, edema or temperature change noted. No objective signs of numbness present. No saddle anesthesia. 2+ DP pulses bilaterally. Sensation intact to light touch. Strength 5/5 in bilateral lower extremities.  Skin:    General: Skin is warm and dry.     Findings: No rash.  Neurological:     General: No focal deficit present.     Mental Status: She is alert and oriented to person, place, and time.     Cranial Nerves: No cranial nerve deficit.     Motor: No weakness or abnormal muscle tone.     Coordination: Coordination normal.      ED Treatments / Results  Labs (all labs ordered are listed, but only abnormal results  are displayed) Labs Reviewed  COMPREHENSIVE METABOLIC PANEL - Abnormal; Notable for the following components:      Result Value   AST 48 (*)    ALT 71 (*)    All other components within normal limits  CBC WITH DIFFERENTIAL/PLATELET - Abnormal; Notable for the following components:   WBC 10.9 (*)    Neutro Abs 7.8 (*)    All other components within normal limits  I-STAT CHEM 8, ED    EKG None  Radiology Dg Tibia/fibula Left  Result Date: 12/20/2018 CLINICAL DATA:  Fall from a horse. The horse stepped on the patient's right flank. Left calf pain. EXAM: LEFT TIBIA AND FIBULA - 2 VIEW COMPARISON:  None. FINDINGS: Ridging along the calcaneus is likely incidental assuming that the patient does not have pain directly along the heel. Mild marginal spurring of the patella along with mild spurring at the quadriceps attachment site of the patella. The tibia and fibula appear unremarkable.  No foreign body noted. IMPRESSION: 1. The tibia and fibula appear unremarkable. 2. Mild linear sclerosis along the calcaneus likely attributable to bony ridging, assuming that the patient does not have heel pain. Electronically Signed   By: Van Clines M.D.   On: 12/20/2018 14:26    Procedures Procedures (including critical care time)  Medications Ordered in ED Medications  HYDROmorphone (DILAUDID) injection 1 mg (1 mg Intravenous Given 12/20/18 1512)  iohexol (OMNIPAQUE) 300 MG/ML solution 100 mL (100 mLs Intravenous Contrast Given 12/20/18 1532)     Initial Impression / Assessment and Plan / ED Course  I have reviewed the triage vital signs and the nursing notes.  Pertinent labs & imaging results that were available during my care of the patient were reviewed by me and considered  in my medical decision making (see chart for details).  Clinical Course as of Dec 19 1528  Sun Dec 20, 2018  1456 Spoke to CT tech about delay in CT of the abdomen pelvis.  They state that they were waiting on a  pregnancy test.  I told them the patient had an ablation and tubal ligation done and that she will sign a waiver if needed.  They state that she is 1 of the next patients to get a CT done.   [HK]    Clinical Course User Index [HK] Delia Heady, PA-C       48 year old female presenting to the ED with a chief complaint of fall.  She was riding a horse when she slipped off the horse and landed on the floor.  The horse then stepped on her right flank area.  She denies any head injury or loss of consciousness.  She is complaining of right flank pain and bruising as well as left calf pain.  Tenderness palpation of the left flank area but no chest tenderness.  She is not currently anticoagulated with medication.  She alert and oriented x3, no deficits neurological exam noted.  No numbness, weakness or vomiting.  Baseline lab work is unremarkable.  X-ray of the left lower extremity is unremarkable.  Awaiting CT of the abdomen pelvis to evaluate for intra-abdominal injury. If negative, will be discharged home with short course of pain medication. Care handed off to oncoming provider.  Final Clinical Impressions(s) / ED Diagnoses   Final diagnoses:  Fall from horse, initial encounter    ED Discharge Orders    None       Delia Heady, PA-C 12/20/18 1530    Lucrezia Starch, MD 12/21/18 [phone]

## 2018-12-20 NOTE — ED Provider Notes (Signed)
  Physical Exam  BP 128/79   Pulse 77   Temp 97.9 F (36.6 C) (Oral)   Resp 16   Ht 5\' 7"  (1.702 m)   Wt 74.8 kg   SpO2 97%   BMI 25.84 kg/m   Physical Exam  ED Course/Procedures   Clinical Course as of Dec 19 1641  Sun Dec 20, 2018  1456 Spoke to CT tech about delay in CT of the abdomen pelvis.  They state that they were waiting on a pregnancy test.  I told them the patient had an ablation and tubal ligation done and that she will sign a waiver if needed.  They state that she is 1 of the next patients to get a CT done.   [HK]    Clinical Course User Index [HK] Delia Heady, PA-C    Procedures  MDM  Patient care assumed from [facility] PA-C at shift change please see her note for a full HPI. Briefly, patient here s/p fall from a horse, had horse step on her right flank, and left calf pain. Plan is for patient to have CT ABD which has been delayed due to pregnancy test. Dispo likely to be discharge home after imaging is cleared.   4:39 PM CT results showed:  1. No signs of significant acute traumatic injury to the abdomen or  pelvis.  2. 1.4 x 0.9 cm lesion in segment 3 of the liver with imaging  characteristics compatible with a small cavernous hemangioma.  3. Aortic atherosclerosis.       These results were discussed at length with patient and husband at the bedside.  She reports feeling better after pain medication given by prior provider.  Patient will go home on a short course of narcotics, reports she has tolerated hydrocodone in the past.  Will prescribe 6 tablets to help with her pain.  A second prescription for muscle relaxers has been written by me, advised not to drink alcohol drive or taking this medication.  Patient is ambulatory in the ED with a steady gait to the bathroom.  Return precautions discussed at length with patient and husband at the bedside.  Patient stable for discharge.      Portions of this note were generated with Theatre manager. Dictation errors may occur despite best attempts at proofreading.        Janeece Fitting, PA-C 12/20/18 1643    Valarie Merino, MD 12/21/18 1946

## 2018-12-20 NOTE — ED Notes (Signed)
Husband at bedside.  

## 2018-12-20 NOTE — Discharge Instructions (Signed)
Your CT today was normal.Your laboratory results were within normal limits.    I have provided a short prescription of pain medication to help with your pain, please take this only for severe pain.  A second prescription for muscle relaxers has also been provided for you.  Please be aware this can make you drowsy, do not drive or drink alcohol while taking this medication.

## 2018-12-20 NOTE — ED Triage Notes (Signed)
Per [facility] pt was on horse when she and the horse slipped causing her to fall down. The horse attempting to get up stepped on patients right flank area. Red and swelling noted. Patient also c/o left calf pain. 160mcg fentanyl given en route. Patient alert and orientated x 4. No LOC.

## 2018-12-20 NOTE — ED Notes (Signed)
Patient verbalizes understanding of discharge instructions. Opportunity for questioning and answers were provided. Armband removed by staff, pt discharged from ED.  

## 2018-12-20 NOTE — ED Notes (Signed)
Patient transported to CT 

## 2018-12-20 NOTE — ED Notes (Signed)
Pt ambulated to restroom with steady gait.

## 2018-12-21 MED FILL — METHOCARBAMOL 500 MG TABS: 500 | 7 days supply | Qty: 14 | Fill #0

## 2018-12-21 MED FILL — HYDROCODON-APAP 5-325: 5-325 | 1 days supply | Qty: 6 | Fill #0

## 2019-01-03 NOTE — Progress Notes (Deleted)
Subjective:    Patient ID: Angela Wells, female    DOB: 08-01-1970, 48 y.o.   MRN: [id number]  HPI:  01/04/2019 OV:  Ms. Bouchard presents for follow-up from  Patient care assumed from [facility] PA-C at shift change please see her note for a full HPI. Briefly, patient here s/p fall from a horse, had horse step on her right flank, and left calf pain. Plan is for patient to have CT ABD which has been delayed due to pregnancy test. Dispo likely to be discharge home after imaging is cleared.  12/20/2018 DG Tibia/Fibula L IMPRESSION: 1. The tibia and fibula appear unremarkable. 2. Mild linear sclerosis along the calcaneus likely attributable to bony ridging, assuming that the patient does not have heel pain. 12/20/2018 CT Abdomen IMPRESSION: 1. No signs of significant acute traumatic injury to the abdomen or pelvis. 2. 1.4 x 0.9 cm lesion in segment 3 of the liver with imaging characteristics compatible with a small cavernous hemangioma. 3. Aortic atherosclerosis.  Hepatic hemangiomas are benign, hypervascular, venous malformations that occur in the liver. They are the most common benign mesenchymal tumors of the liver. Hemangiomas are lined by endothelial cells with a thin fibrous stroma  Patient Care Team    Relationship Specialty Notifications Start End  Esaw Grandchild, NP PCP - General Family Medicine  12/03/16   Lady Gary, [facility]    03/25/17     Patient Active Problem List   Diagnosis Date Noted  . Symptoms, such as flushing, sleeplessness, headache, lack of concentration, associated with the menopause 04/16/2017  . Acute right hip pain 03/24/2017  . Dyslipidemia with elevated low density lipoprotein (LDL) cholesterol and abnormally low high density lipoprotein cholesterol 03/24/2017  . Insomnia 12/12/2016  . Onychomycosis 12/12/2016  . Healthcare maintenance 12/11/2016  . Arthritis 11/10/2014     Past Medical History:  Diagnosis Date  . Abnormal Pap  smear   . Anxiety   . Arthritis    hands  . Depression   . Dyslipidemia with elevated low density lipoprotein (LDL) cholesterol and abnormally low high density lipoprotein cholesterol    diet controlled  . Eczema   . Hypertension    resolved - lost weight, no med  . Hyperthyroidism   . SVD (spontaneous vaginal delivery)    x 2     Past Surgical History:  Procedure Laterality Date  . DILATATION & CURETTAGE/HYSTEROSCOPY WITH MYOSURE N/A 06/16/2015   Procedure: DILATATION & CURETTAGE/HYSTEROSCOPY WITH Novasure;  Surgeon: [name], MD;  Location: [facility];  Service: Gynecology;  Laterality: N/A;  . GYNECOLOGIC CRYOSURGERY    . IUD REMOVAL  6/12   in cx  . LAPAROSCOPIC TUBAL LIGATION  03/04/2011   Procedure: LAPAROSCOPIC TUBAL LIGATION;  Surgeon: Felipa Emory;  Location: [facility];  Service: Gynecology;  Laterality: Bilateral;  with filshie clips  . TUBAL LIGATION  03/04/2011   Procedure: ESSURE TUBAL STERILIZATION;  Surgeon: Felipa Emory;  Location: [facility];  Service: Gynecology;  Laterality: Bilateral;  Attempted  . WISDOM TOOTH EXTRACTION       Family History  Problem Relation Age of Onset  . Hypertension Father   . Hyperlipidemia Father   . Thyroid disease Mother   . Hypertension Sister   . Hyperlipidemia Sister   . Healthy Sister   . Healthy Daughter   . Healthy Son      Social History   Substance and Sexual Activity  Drug Use No  Social History   Substance and Sexual Activity  Alcohol Use Yes  . Alcohol/week: 0.0 - 1.0 standard drinks     Social History   Tobacco Use  Smoking Status Former Smoker  . Packs/day: 0.25  . Years: 13.00  . Pack years: 3.25  . Types: Cigarettes  . Quit date: 03/18/1998  . Years since quitting: 20.8  Smokeless Tobacco Never Used     Outpatient Encounter Medications as of 01/04/2019  Medication Sig  . 5-HTP CAPS Take by mouth daily.  Marland Kitchen acetaminophen (TYLENOL) 325 MG tablet Take 650 mg by mouth every 6 (six)  hours as needed for mild pain or headache.  . BL EVENING PRIMROSE OIL PO Take 1 capsule by mouth 2 (two) times daily. Reported on 06/05/2015  . BLACK COHOSH PO Take 1 tablet by mouth 2 (two) times daily. Reported on 06/05/2015  . buPROPion (WELLBUTRIN XL) 150 MG 24 hr tablet Take 0.5 tablets by mouth daily.  . Cyanocobalamin (VITAMIN B 12) 100 MCG LOZG Take by mouth.  . DHA-EPA-Vitamin E (OMEGA-3 COMPLEX) 192-251-11 MG-MG-UNIT CAPS Take 2 capsules by mouth 2 (two) times daily. Reported on 06/05/2015  . gabapentin (NEURONTIN) 100 MG capsule Take 100 mg by mouth at bedtime.  Marland Kitchen levothyroxine (SYNTHROID) 50 MCG tablet Take 1 tablet (50 mcg total) by mouth daily.  . Multiple Vitamins-Minerals (MULTIVITAMINS THER. W/MINERALS) TABS Take 1 tablet by mouth daily. Reported on 06/05/2015  . Nutritional Supplements (CATALYTIC FORMULA) TABS Take 1 tablet by mouth daily. Reported on 06/05/2015  . pyridOXINE (VITAMIN B-6) 25 MG tablet Take by mouth daily.  . Safflower Oil (CLA) 1000 MG CAPS   . zolpidem (AMBIEN) 10 MG tablet 1/2 to 1 tab qhs prn insomnia   No facility-administered encounter medications on file as of 01/04/2019.     Allergies: Patient has no known allergies.  There is no height or weight on file to calculate BMI.  There were no vitals taken for this visit.     Review of Systems     Objective:   Physical Exam        Assessment & Plan:  No diagnosis found.  No problem-specific Assessment & Plan notes found for this encounter.    FOLLOW-UP:  No follow-ups on file.

## 2019-01-04 ENCOUNTER — Inpatient Hospital Stay: Payer: 59 | Admitting: Adult Health

## 2019-01-07 MED FILL — LEVOTHYROXINE 50 MCG TABLET: 50 | 90 days supply | Qty: 90 | Fill #1

## 2019-01-11 ENCOUNTER — Other Ambulatory Visit: Payer: Self-pay | Admitting: Gynecologic Oncology

## 2019-01-11 DIAGNOSIS — B3731 Acute candidiasis of vulva and vagina: Secondary | ICD-10-CM

## 2019-01-11 DIAGNOSIS — B373 Candidiasis of vulva and vagina: Secondary | ICD-10-CM

## 2019-01-11 MED ORDER — FLUCONAZOLE 100 MG PO TABS: 100.0000 mg | ORAL_TABLET | Freq: Every day | ORAL | 0 refills | Status: DC

## 2019-01-11 MED FILL — FLUCONAZOLE 100 MG TAB: 100 | 4 days supply | Qty: 2 | Fill #0

## 2019-01-11 NOTE — Progress Notes (Signed)
Patient presented to the office without an appt with complaints of vulvo-vaginal candidiasis. She had tried topical creams with no relief.  Diflucan sent in to take once with another tablet to repeat if needed.

## 2019-02-01 ENCOUNTER — Encounter: Payer: Self-pay | Admitting: Gastroenterology

## 2019-02-16 ENCOUNTER — Encounter: Payer: Self-pay | Admitting: *Deleted

## 2019-02-16 NOTE — Telephone Encounter (Signed)
enry error

## 2019-02-24 ENCOUNTER — Other Ambulatory Visit: Payer: Self-pay

## 2019-02-24 ENCOUNTER — Ambulatory Visit (AMBULATORY_SURGERY_CENTER): Payer: 59

## 2019-02-24 VITALS — Temp 97.3°F | Ht 67.0 in | Wt 169.4 lb

## 2019-02-24 DIAGNOSIS — Z1159 Encounter for screening for other viral diseases: Secondary | ICD-10-CM

## 2019-02-24 DIAGNOSIS — Z1211 Encounter for screening for malignant neoplasm of colon: Secondary | ICD-10-CM

## 2019-02-24 MED ORDER — NA SULFATE-K SULFATE-MG SULF 17.5-3.13-1.6 GM/177ML PO SOLN: 1.0000 | Freq: Once | ORAL | 0 refills | Status: AC

## 2019-02-24 MED FILL — SUPREP BOWEL PREP KIT: 17.5-3.13-1 | 2 days supply | Qty: 354 | Fill #0

## 2019-02-24 NOTE — Progress Notes (Signed)

## 2019-02-25 ENCOUNTER — Encounter: Payer: Self-pay | Admitting: Gastroenterology

## 2019-03-01 ENCOUNTER — Ambulatory Visit (INDEPENDENT_AMBULATORY_CARE_PROVIDER_SITE_OTHER): Payer: 59

## 2019-03-01 DIAGNOSIS — Z1159 Encounter for screening for other viral diseases: Secondary | ICD-10-CM

## 2019-03-02 LAB — SARS CORONAVIRUS 2 (TAT 6-24 HRS): SARS Coronavirus 2: NEGATIVE

## 2019-03-04 ENCOUNTER — Other Ambulatory Visit: Payer: Self-pay

## 2019-03-04 ENCOUNTER — Encounter: Payer: Self-pay | Admitting: Gastroenterology

## 2019-03-04 ENCOUNTER — Ambulatory Visit (AMBULATORY_SURGERY_CENTER): Payer: 59 | Admitting: Gastroenterology

## 2019-03-04 VITALS — BP 127/77 | HR 56 | Temp 98.8°F | Resp 16 | Ht 67.0 in | Wt 169.4 lb

## 2019-03-04 DIAGNOSIS — Z8371 Family history of colonic polyps: Secondary | ICD-10-CM

## 2019-03-04 DIAGNOSIS — Z1211 Encounter for screening for malignant neoplasm of colon: Secondary | ICD-10-CM | POA: Diagnosis not present

## 2019-03-04 MED ORDER — SODIUM CHLORIDE 0.9 % IV SOLN: 500.0000 mL | Freq: Once | INTRAVENOUS | Status: DC

## 2019-03-04 NOTE — Patient Instructions (Addendum)
Information on hemorrhoids given to you today.  Repeat colonoscopy in 5 years.  YOU HAD AN ENDOSCOPIC PROCEDURE TODAY AT [facility]:   Refer to the procedure report that was given to you for any specific questions about what was found during the examination.  If the procedure report does not answer your questions, please call your gastroenterologist to clarify.  If you requested that your care partner not be given the details of your procedure findings, then the procedure report has been included in a sealed envelope for you to review at your convenience later.  YOU SHOULD EXPECT: Some feelings of bloating in the abdomen. Passage of more gas than usual.  Walking can help get rid of the air that was put into your GI tract during the procedure and reduce the bloating. If you had a lower endoscopy (such as a colonoscopy or flexible sigmoidoscopy) you may notice spotting of blood in your stool or on the toilet paper. If you underwent a bowel prep for your procedure, you may not have a normal bowel movement for a few days.  Please Note:  You might notice some irritation and congestion in your nose or some drainage.  This is from the oxygen used during your procedure.  There is no need for concern and it should clear up in a day or so.  SYMPTOMS TO REPORT IMMEDIATELY:   Following lower endoscopy (colonoscopy or flexible sigmoidoscopy):  Excessive amounts of blood in the stool  Significant tenderness or worsening of abdominal pains  Swelling of the abdomen that is new, acute  Fever of 100F or higher    For urgent or emergent issues, a gastroenterologist can be reached at any hour by calling [phone].   DIET:  We do recommend a small meal at first, but then you may proceed to your regular diet.  Drink plenty of fluids but you should avoid alcoholic beverages for 24 hours.  ACTIVITY:  You should plan to take it easy for the rest of today and you should NOT DRIVE or use heavy  machinery until tomorrow (because of the sedation medicines used during the test).    FOLLOW UP: Our staff will call the number listed on your records 48-72 hours following your procedure to check on you and address any questions or concerns that you may have regarding the information given to you following your procedure. If we do not reach you, we will leave a message.  We will attempt to reach you two times.  During this call, we will ask if you have developed any symptoms of COVID 19. If you develop any symptoms (ie: fever, flu-like symptoms, shortness of breath, cough etc.) before then, please call [phone].  If you test positive for Covid 19 in the 2 weeks post procedure, please call and report this information to [place].    If any biopsies were taken you will be contacted by phone or by letter within the next 1-3 weeks.  Please call us at [phone] if you have not heard about the biopsies in 3 weeks.    SIGNATURES/CONFIDENTIALITY: You and/or your care partner have signed paperwork which will be entered into your electronic medical record.  These signatures attest to the fact that that the information above on your After Visit Summary has been reviewed and is understood.  Full responsibility of the confidentiality of this discharge information lies with you and/or your care-partner.

## 2019-03-04 NOTE — Progress Notes (Signed)
Pt's states no medical or surgical changes since previsit or office visit.  Temp taken by JB VS taken by CW 

## 2019-03-04 NOTE — Progress Notes (Signed)
To PACU, VSS. Report to RN.tb 

## 2019-03-04 NOTE — Op Note (Addendum)
[facility] Patient Name: Angela Wells Procedure Date: 03/04/2019 10:22 AM MRN: [id number] Endoscopist: Thornton Park MD, MD Age: 48 Referring MD:  Date of Birth: [birth year] Gender: Female Account #: [id number] Procedure:                Colonoscopy Indications:              Colon cancer screening in patient at increased                            risk: Family history of 1st-degree relative with                            colon polyps                           No prior colon cancer screening Medicines:                Monitored Anesthesia Care Procedure:                Pre-Anesthesia Assessment:                           - Prior to the procedure, a History and Physical                            was performed, and patient medications and                            allergies were reviewed. The patient's tolerance of                            previous anesthesia was also reviewed. The risks                            and benefits of the procedure and the sedation                            options and risks were discussed with the patient.                            All questions were answered, and informed consent                            was obtained. Prior Anticoagulants: The patient has                            taken no previous anticoagulant or antiplatelet                            agents. ASA Grade Assessment: II - A patient with                            mild systemic disease. After reviewing the risks  and benefits, the patient was deemed in                            satisfactory condition to undergo the procedure.                           After obtaining informed consent, the colonoscope                            was passed under direct vision. Throughout the                            procedure, the patient's blood pressure, pulse, and                            oxygen saturations were monitored continuously. The               Colonoscope was introduced through the anus and                            advanced to the 3 cm into the ileum. A second                            forward view of the right colon was performed. The                            colonoscopy was performed without difficulty. The                            patient tolerated the procedure well. The quality                            of the bowel preparation was good. The terminal                            ileum and the rectum were photographed. Scope In: 10:29:26 AM Scope Out: 10:40:58 AM Scope Withdrawal Time: 0 hours 8 minutes 37 seconds  Total Procedure Duration: 0 hours 11 minutes 32 seconds  Findings:                 The perianal and digital rectal examinations were                            normal.                           The colon (entire examined portion) appeared normal.                           The terminal ileum appeared normal.                           Non-bleeding internal hemorrhoids were found. The  hemorrhoids were small.                           The exam was otherwise without abnormality on                            direct and retroflexion views. Complications:            No immediate complications. Estimated Blood Loss:     Estimated blood loss: none. Impression:               - The entire examined colon is normal.                           - The examined portion of the ileum was normal.                           - Non-bleeding internal hemorrhoids.                           - The examination was otherwise normal on direct                            and retroflexion views.                           - No specimens collected. Recommendation:           - Patient has a contact number available for                            emergencies. The signs and symptoms of potential                            delayed complications were discussed with the                            patient.  Return to normal activities tomorrow.                            Written discharge instructions were provided to the                            patient.                           - Resume previous diet.                           - Continue present medications.                           - Repeat colonoscopy in 5 years for screening                            purposes given the family history of colon polyps. Thornton Park MD, MD 03/04/2019 10:46:05 AM This report has been signed electronically.

## 2019-03-08 ENCOUNTER — Telehealth: Payer: Self-pay

## 2019-03-08 NOTE — Telephone Encounter (Signed)
Left message on 2nd follow up call. 

## 2019-03-08 NOTE — Telephone Encounter (Signed)
Left message on follow up call. 

## 2019-03-09 DIAGNOSIS — H524 Presbyopia: Secondary | ICD-10-CM | POA: Diagnosis not present

## 2019-04-12 ENCOUNTER — Encounter: Payer: Self-pay | Admitting: Obstetrics & Gynecology

## 2019-04-12 ENCOUNTER — Other Ambulatory Visit: Payer: Self-pay | Admitting: Obstetrics & Gynecology

## 2019-04-12 MED ORDER — ZOLPIDEM TARTRATE 10 MG PO TABS: ORAL_TABLET | ORAL | 1 refills | Status: DC

## 2019-04-12 MED FILL — ZOLPIDEM TARTRATE 10 MG TAB: 10 | 30 days supply | Qty: 30 | Fill #0

## 2019-04-12 MED FILL — LEVOTHYROXINE 50 MCG TABLET: 50 | 90 days supply | Qty: 90 | Fill #2

## 2019-04-12 NOTE — Telephone Encounter (Signed)
Patient sent the following message through [place]. Routing to triage to assist patient with request.  [facility]  Phone Number: [phone]  zolpidem 10 MG tablet   I would like to have a refill on the Ambien prescription.   Thank you,  Angela Wells

## 2019-04-12 NOTE — Telephone Encounter (Signed)
Medication refill request:Ambien  Last AEX:  08/14/18 Next AEX: 10/29/19 Last MMG (if hormonal medication request): NA Refill authorized: #30 with 1 RF

## 2019-04-15 ENCOUNTER — Telehealth: Payer: Self-pay | Admitting: Obstetrics & Gynecology

## 2019-04-15 ENCOUNTER — Encounter: Payer: Self-pay | Admitting: Obstetrics & Gynecology

## 2019-04-15 DIAGNOSIS — Z01419 Encounter for gynecological examination (general) (routine) without abnormal findings: Secondary | ICD-10-CM

## 2019-04-15 NOTE — Telephone Encounter (Signed)
Patient sent the following message through [place]. Routing to triage to assist patient with request.  [facility] Clinical Pool  Phone Number: [phone]  [facility] Dr. Sabra Heck,   I would like to request a refill on the generic ambien please.   Thank you,   Angela Wells

## 2019-04-16 NOTE — Telephone Encounter (Signed)
Med refill request: generic Ambien 5mg  Last AEX: 08/14/18 Next AEX: 10/29/2019 Last MMG (if hormonal med) n/a Refill authorized: #30, 0RF.   Left message for pt to call back to ask about Rx request from 04/12/2019.

## 2019-07-19 ENCOUNTER — Encounter: Payer: Self-pay | Admitting: Internal Medicine

## 2019-07-20 ENCOUNTER — Other Ambulatory Visit: Payer: Self-pay | Admitting: Obstetrics & Gynecology

## 2019-07-20 DIAGNOSIS — Z1231 Encounter for screening mammogram for malignant neoplasm of breast: Secondary | ICD-10-CM

## 2019-07-20 MED FILL — LEVOTHYROXINE 50 MCG TABLET: 50 | 90 days supply | Qty: 90 | Fill #3

## 2019-07-20 MED FILL — ZOLPIDEM TARTRATE 10 MG TAB: 10 | 30 days supply | Qty: 30 | Fill #1

## 2019-08-12 ENCOUNTER — Other Ambulatory Visit: Payer: Self-pay

## 2019-08-17 ENCOUNTER — Encounter: Payer: Self-pay | Admitting: Internal Medicine

## 2019-08-17 ENCOUNTER — Other Ambulatory Visit: Payer: Self-pay

## 2019-08-17 ENCOUNTER — Ambulatory Visit: Payer: 59 | Admitting: Internal Medicine

## 2019-08-17 VITALS — BP 118/78 | HR 57 | Temp 97.9°F | Ht 67.0 in | Wt 174.8 lb

## 2019-08-17 DIAGNOSIS — E039 Hypothyroidism, unspecified: Secondary | ICD-10-CM

## 2019-08-17 DIAGNOSIS — E038 Other specified hypothyroidism: Secondary | ICD-10-CM

## 2019-08-17 LAB — T4, FREE: Free T4: 0.88 ng/dL (ref 0.60–1.60)

## 2019-08-17 LAB — TSH: TSH: 3.9 u[IU]/mL (ref 0.35–4.50)

## 2019-08-17 MED ORDER — LEVOTHYROXINE SODIUM 50 MCG PO TABS: 50.0000 ug | ORAL_TABLET | Freq: Every day | ORAL | 3 refills | Status: DC

## 2019-08-17 NOTE — Patient Instructions (Signed)

## 2019-08-17 NOTE — Progress Notes (Signed)
Name: Angela Wells  MRN/ DOB: [id number], [birth year]    Age/ Sex: 49 y.o., female     PCP: System, Provider Not In   Reason for Endocrinology Evaluation: Subclinical hypothyroidism     Initial Endocrinology Clinic Visit: 10/08/2019    PATIENT IDENTIFIER: [name] is a 49 y.o., female with a past medical history of Dyslipidemia. She has followed with [facility] since 10/08/2018 for consultative assistance with management of her subclinical hypothyroisism.   HISTORICAL SUMMARY:  Pt noted to have an elevated TSH at 8.490 uIU/mL with normal FT4 during routine labs in 07/2018. Repeat labs a month later confirmed similar results.   Pt was started on LT-4 replacement at the time   Mother with Hashimoto's   SUBJECTIVE:    Today (08/17/2019):  [name] is here for a follow up on subclinical hypothyroidism.   She has been compliant with levothyroxine bu has been taking it at bedtime.  Energy level has been fluctuating Denies constipation Has chronic depression    Had a recent horse accident   Denies hair loss.    ROS:  As per HPI.   HISTORY:  Past Medical History:  Past Medical History:  Diagnosis Date  . Abnormal Pap smear   . Anxiety   . Arthritis    hands  . Depression   . Dyslipidemia with elevated low density lipoprotein (LDL) cholesterol and abnormally low high density lipoprotein cholesterol    diet controlled  . Eczema   . Hypertension    resolved - lost weight, no med  . Hyperthyroidism   . SVD (spontaneous vaginal delivery)    x 2   Past Surgical History:  Past Surgical History:  Procedure Laterality Date  . DILATATION & CURETTAGE/HYSTEROSCOPY WITH MYOSURE N/A 06/16/2015   Procedure: DILATATION & CURETTAGE/HYSTEROSCOPY WITH Novasure;  Surgeon: [name], MD;  Location: [facility];  Service: Gynecology;  Laterality: N/A;  . GYNECOLOGIC CRYOSURGERY    . IUD REMOVAL  6/12   in cx  . LAPAROSCOPIC TUBAL LIGATION  03/04/2011    Procedure: LAPAROSCOPIC TUBAL LIGATION;  Surgeon: Felipa Emory;  Location: [facility];  Service: Gynecology;  Laterality: Bilateral;  with filshie clips  . TUBAL LIGATION  03/04/2011   Procedure: ESSURE TUBAL STERILIZATION;  Surgeon: Felipa Emory;  Location: [facility];  Service: Gynecology;  Laterality: Bilateral;  Attempted  . WISDOM TOOTH EXTRACTION      Social History:  reports that she quit smoking about 21 years ago. Her smoking use included cigarettes. She has a 3.25 pack-year smoking history. She has never used smokeless tobacco. She reports current alcohol use. She reports that she does not use drugs. Family History:  Family History  Problem Relation Age of Onset  . Hypertension Father   . Hyperlipidemia Father   . Colon polyps Father   . Thyroid disease Mother   . Hypertension Sister   . Hyperlipidemia Sister   . Healthy Sister   . Healthy Daughter   . Healthy Son   . Colon cancer Neg Hx   . Esophageal cancer Neg Hx   . Rectal cancer Neg Hx   . Stomach cancer Neg Hx      HOME MEDICATIONS: Allergies as of 08/17/2019   No Known Allergies     Medication List       Accurate as of August 17, 2019 12:54 PM. If you have any questions, ask your nurse or doctor.  5-HTP Caps Take by mouth daily.   acetaminophen 325 MG tablet Commonly known as: TYLENOL Take 650 mg by mouth every 6 (six) hours as needed for mild pain or headache.   BL EVENING PRIMROSE OIL PO Take 1 capsule by mouth 2 (two) times daily. Reported on 06/05/2015   BLACK COHOSH PO Take 1 tablet by mouth 2 (two) times daily. Reported on 06/05/2015   buPROPion 150 MG 24 hr tablet Commonly known as: WELLBUTRIN XL Take 0.5 tablets by mouth daily.   Catalytic Formula Tabs Take 1 tablet by mouth daily. Reported on 06/05/2015   CLA 1000 MG Caps   fluconazole 100 MG tablet Commonly known as: DIFLUCAN Take 1 tablet (100 mg total) by mouth daily. Take one tablet, can repeat in three days if symptoms  persist   gabapentin 100 MG capsule Commonly known as: NEURONTIN Take 100 mg by mouth at bedtime.   ibuprofen 400 MG tablet Commonly known as: ADVIL Take 400 mg by mouth every 6 (six) hours as needed.   levothyroxine 50 MCG tablet Commonly known as: SYNTHROID Take 1 tablet (50 mcg total) by mouth daily.   multivitamins ther. w/minerals Tabs tablet Take 1 tablet by mouth daily. Reported on 06/05/2015   Omega-3 Complex 192-251-11 MG-MG-UNIT Caps Generic drug: DHA-EPA-Vitamin E Take 2 capsules by mouth 2 (two) times daily. Reported on 06/05/2015   pyridOXINE 25 MG tablet Commonly known as: VITAMIN B-6 Take by mouth daily.   Vitamin B 12 100 MCG Lozg Take by mouth.         OBJECTIVE:   PHYSICAL EXAM: VS: BP 118/78 (BP Location: Left Arm, Patient Position: Sitting, Cuff Size: Large)   Pulse (!) 57   Temp 97.9 F (36.6 C)   Ht 5\' 7"  (1.702 m)   Wt 174 lb 12.8 oz (79.3 kg)   SpO2 99%   BMI 27.38 kg/m    EXAM: General: Pt appears well and is in NAD  Neck: General: Supple without adenopathy. Thyroid: Thyroid size normal.  No goiter or nodules appreciated. No thyroid bruit.  Lungs: Clear with good BS bilat with no rales, rhonchi, or wheezes  Heart: Auscultation: RRR.  Abdomen: Normoactive bowel sounds, soft, nontender, without masses or organomegaly palpable  Extremities:  BL LE: No pretibial edema normal ROM and strength.  Mental Status: Judgment, insight: Intact Orientation: Oriented to time, place, and person Mood and affect: No depression, anxiety, or agitation     DATA REVIEWED:  Results for Angela Wells, Angela Wells (MRN [id number]) as of 08/17/2019 12:54  Ref. Range 08/17/2019 07:52  TSH Latest Ref Range: 0.35 - 4.50 uIU/mL 3.90  T4,Free(Direct) Latest Ref Range: 0.60 - 1.60 ng/dL 0.88    ASSESSMENT / PLAN / RECOMMENDATIONS:   1. Hypothyroidism:   - Pt is clinically euthyroid - No local neck symptoms  - Repeat TFT's continue to be normal.  - No changes today     Medications   Levothyroxine 50 mcg daily   F/U in 1 yr    Signed electronically by: Mack Guise, MD  [facility]  [facility] [address] [place], [place] [address] Phone: [phone] FAX: [phone]      CC: System, Provider Not In No address on file Phone: None  Fax: None   Return to Endocrinology clinic as below: Future Appointments  Date Time Provider [place]  10/07/2019  7:00 AM [facility] 3 [facility]  10/29/2019  3:00 PM [name], MD [place]  None  08/17/2020  7:30 AM Amal Renbarger, Melanie Crazier, MD [facility] None

## 2019-10-07 ENCOUNTER — Other Ambulatory Visit: Payer: Self-pay

## 2019-10-07 ENCOUNTER — Ambulatory Visit
Admission: RE | Admit: 2019-10-07 | Discharge: 2019-10-07 | Disposition: A | Discharge status: Home / Self Care | Payer: 59 | Source: Ambulatory Visit

## 2019-10-07 DIAGNOSIS — Z1231 Encounter for screening mammogram for malignant neoplasm of breast: Secondary | ICD-10-CM

## 2019-10-22 ENCOUNTER — Encounter: Payer: Self-pay | Admitting: Internal Medicine

## 2019-10-22 ENCOUNTER — Encounter: Payer: Self-pay | Admitting: Obstetrics & Gynecology

## 2019-10-22 ENCOUNTER — Other Ambulatory Visit: Payer: Self-pay | Admitting: Obstetrics & Gynecology

## 2019-10-22 ENCOUNTER — Other Ambulatory Visit: Payer: Self-pay

## 2019-10-22 DIAGNOSIS — E038 Other specified hypothyroidism: Secondary | ICD-10-CM

## 2019-10-22 MED ORDER — LEVOTHYROXINE SODIUM 50 MCG PO TABS: 50.0000 ug | ORAL_TABLET | Freq: Every day | ORAL | 3 refills | Status: DC

## 2019-10-22 MED ORDER — ZOLPIDEM TARTRATE 5 MG PO TABS: ORAL_TABLET | ORAL | 0 refills | Status: DC

## 2019-10-22 MED FILL — LEVOTHYROXINE 50 MCG TABLET: 50 | 90 days supply | Qty: 90 | Fill #0

## 2019-10-22 MED FILL — ZOLPIDEM TARTRATE 5 MG TAB: 5 | 30 days supply | Qty: 30 | Fill #0

## 2019-10-29 ENCOUNTER — Ambulatory Visit: Payer: 59 | Admitting: Obstetrics & Gynecology

## 2020-01-17 ENCOUNTER — Other Ambulatory Visit: Payer: Self-pay | Admitting: Obstetrics & Gynecology

## 2020-01-17 ENCOUNTER — Other Ambulatory Visit: Payer: Self-pay

## 2020-01-17 ENCOUNTER — Encounter: Payer: Self-pay | Admitting: Obstetrics & Gynecology

## 2020-01-17 ENCOUNTER — Ambulatory Visit (INDEPENDENT_AMBULATORY_CARE_PROVIDER_SITE_OTHER): Payer: 59 | Admitting: Obstetrics & Gynecology

## 2020-01-17 ENCOUNTER — Other Ambulatory Visit (HOSPITAL_COMMUNITY)
Admission: RE | Admit: 2020-01-17 | Discharge: 2020-01-17 | Disposition: A | Discharge status: Home / Self Care | Payer: 59 | Source: Ambulatory Visit | Attending: Obstetrics & Gynecology | Admitting: Obstetrics & Gynecology

## 2020-01-17 VITALS — BP 118/70 | HR 64 | Resp 14 | Ht 66.5 in | Wt 184.0 lb

## 2020-01-17 DIAGNOSIS — Z124 Encounter for screening for malignant neoplasm of cervix: Secondary | ICD-10-CM | POA: Insufficient documentation

## 2020-01-17 DIAGNOSIS — Z01419 Encounter for gynecological examination (general) (routine) without abnormal findings: Secondary | ICD-10-CM | POA: Diagnosis not present

## 2020-01-17 DIAGNOSIS — E785 Hyperlipidemia, unspecified: Secondary | ICD-10-CM

## 2020-01-17 DIAGNOSIS — R748 Abnormal levels of other serum enzymes: Secondary | ICD-10-CM | POA: Diagnosis not present

## 2020-01-17 DIAGNOSIS — Z23 Encounter for immunization: Secondary | ICD-10-CM | POA: Diagnosis not present

## 2020-01-17 MED ORDER — ZOLPIDEM TARTRATE 5 MG PO TABS: ORAL_TABLET | ORAL | 3 refills | Status: DC

## 2020-01-17 MED FILL — ZOLPIDEM TARTRATE 5 MG TABS: 5 | 30 days supply | Qty: 30 | Fill #0

## 2020-01-17 NOTE — Progress Notes (Signed)
49 y.o. [id number] Married White or Caucasian female here for annual exam. Doing well.  Denies vaginal bleeding.   Has intermittent hot flashes.  This gets better and gets worse.  She feels this is tolerable.  Takes ambien.  Seeing Dr. Kelton Pillar for thyroid issues.    No LMP recorded. Patient has had an ablation.          Sexually active: Yes.    The current method of family planning is tubal ligation.    Exercising: No.  The patient does not participate in regular exercise at present. Smoker:  no  Health Maintenance: Pap:   08/14/18 Neg  02/23/16 Neg:Neg HR HPV  11/10/14 Neg History of abnormal Pap:  Yes, hx of cryo MMG:  10/07/19 BIRADS 1 negative Colonoscopy:  03/04/19 f/u 5 years BMD:   n/a TDaP:  06/27/09 Pneumonia vaccine(s):  n/a Shingrix:   n/a Hep C testing: n/a Screening Labs: discuss today.  Did blood work in 07/2018.     reports that she quit smoking about 21 years ago. Her smoking use included cigarettes. She has a 3.25 pack-year smoking history. She has never used smokeless tobacco. She reports current alcohol use. She reports that she does not use drugs.  Past Medical History:  Diagnosis Date  . Abnormal Pap smear   . Anxiety   . Arthritis    hands  . Depression   . Dyslipidemia with elevated low density lipoprotein (LDL) cholesterol and abnormally low high density lipoprotein cholesterol    diet controlled  . Eczema   . Hypertension    resolved - lost weight, no med  . Hyperthyroidism   . SVD (spontaneous vaginal delivery)    x 2    Past Surgical History:  Procedure Laterality Date  . DILATATION & CURETTAGE/HYSTEROSCOPY WITH MYOSURE N/A 06/16/2015   Procedure: DILATATION & CURETTAGE/HYSTEROSCOPY WITH Novasure;  Surgeon: [name], MD;  Location: [facility];  Service: Gynecology;  Laterality: N/A;  . GYNECOLOGIC CRYOSURGERY    . IUD REMOVAL  6/12   in cx  . LAPAROSCOPIC TUBAL LIGATION  03/04/2011   Procedure: LAPAROSCOPIC TUBAL LIGATION;  Surgeon: Felipa Emory;  Location: [facility];  Service: Gynecology;  Laterality: Bilateral;  with filshie clips  . TUBAL LIGATION  03/04/2011   Procedure: ESSURE TUBAL STERILIZATION;  Surgeon: Felipa Emory;  Location: [facility];  Service: Gynecology;  Laterality: Bilateral;  Attempted  . WISDOM TOOTH EXTRACTION      Current Outpatient Medications  Medication Sig Dispense Refill  . acetaminophen (TYLENOL) 325 MG tablet Take 650 mg by mouth every 6 (six) hours as needed for mild pain or headache.    . BL EVENING PRIMROSE OIL PO Take 1 capsule by mouth 2 (two) times daily. Reported on 06/05/2015    . BLACK COHOSH PO Take 1 tablet by mouth 2 (two) times daily. Reported on 06/05/2015    . buPROPion (WELLBUTRIN XL) 150 MG 24 hr tablet Take 0.5 tablets by mouth daily.  2  . Cyanocobalamin (VITAMIN B 12) 100 MCG LOZG Take by mouth.    . DHA-EPA-Vitamin E (OMEGA-3 COMPLEX) 192-251-11 MG-MG-UNIT CAPS Take 2 capsules by mouth 2 (two) times daily. Reported on 06/05/2015    . gabapentin (NEURONTIN) 100 MG capsule Take 100 mg by mouth at bedtime.    Marland Kitchen ibuprofen (ADVIL) 400 MG tablet Take 400 mg by mouth every 6 (six) hours as needed.    Marland Kitchen levothyroxine (SYNTHROID) 50 MCG tablet Take 1 tablet (50 mcg total)  by mouth daily. 90 tablet 3  . Multiple Vitamins-Minerals (MULTIVITAMINS THER. W/MINERALS) TABS Take 1 tablet by mouth daily. Reported on 06/05/2015    . Nutritional Supplements (CATALYTIC FORMULA) TABS Take 1 tablet by mouth daily. Reported on 06/05/2015    . zolpidem (AMBIEN) 5 MG tablet 1/2 to 1 tab qhs prn insomnia 30 tablet 0   No current facility-administered medications for this visit.    Family History  Problem Relation Age of Onset  . Hypertension Father   . Hyperlipidemia Father   . Colon polyps Father   . Thyroid disease Mother   . Hypertension Sister   . Hyperlipidemia Sister   . Healthy Sister   . Healthy Daughter   . Healthy Son   . Colon cancer Neg Hx   . Esophageal cancer Neg Hx   . Rectal cancer  Neg Hx   . Stomach cancer Neg Hx     Review of Systems  All other systems reviewed and are negative.   Exam:   BP 118/70 (BP Location: Right Arm, Patient Position: Sitting, Cuff Size: Normal)   Pulse 64   Resp 14   Ht 5' 6.5" (1.689 m)   Wt 184 lb (83.5 kg)   BMI 29.25 kg/m   Height: 5' 6.5" (168.9 cm)  General appearance: alert, cooperative and appears stated age Head: Normocephalic, without obvious abnormality, atraumatic Neck: no adenopathy, supple, symmetrical, trachea midline and thyroid normal to inspection and palpation Lungs: clear to auscultation bilaterally Breasts: normal appearance, no masses or tenderness Heart: regular rate and rhythm Abdomen: soft, non-tender; bowel sounds normal; no masses,  no organomegaly Extremities: extremities normal, atraumatic, no cyanosis or edema Skin: Skin color, texture, turgor normal. No rashes or lesions Lymph nodes: Cervical, supraclavicular, and axillary nodes normal. No abnormal inguinal nodes palpated Neurologic: Grossly normal   Pelvic: External genitalia:  no lesions              Urethra:  normal appearing urethra with no masses, tenderness or lesions              Bartholins and Skenes: normal                 Vagina: normal appearing vagina with normal color and discharge, no lesions              Cervix: no lesions              Pap taken: Yes.   Bimanual Exam:  Uterus:  normal size, contour, position, consistency, mobility, non-tender              Adnexa: normal adnexa and no mass, fullness, tenderness               Rectovaginal: Confirms               Anus:  normal sphincter tone, no lesions  Chaperone, Terence Lux, CMA, was present for exam.  A:  Well Woman with normal exam PMP, no HRT Vasomotor symptoms Elevated lipids, declines treatment, with family hx H/o hypertension that resolved with weight loss Chronic insomnia H/o elevated liver enzymes  P:   Mammogram guidelines reviewed.   Pap smear with neg HR  HPV Colonoscopy 02/2019 Tdap updated today 02/2019 Ambien 5mg  1 tab po qhs prn insomnia.  #30/3RF She is going to return for CMP and fasting lipid panel, future orders placed Referral to Dr. Meda Coffee placed today Return annually or prn

## 2020-01-18 DIAGNOSIS — Z23 Encounter for immunization: Secondary | ICD-10-CM | POA: Diagnosis not present

## 2020-01-18 DIAGNOSIS — Z124 Encounter for screening for malignant neoplasm of cervix: Secondary | ICD-10-CM | POA: Diagnosis not present

## 2020-01-18 DIAGNOSIS — Z01419 Encounter for gynecological examination (general) (routine) without abnormal findings: Secondary | ICD-10-CM | POA: Diagnosis not present

## 2020-01-18 DIAGNOSIS — E785 Hyperlipidemia, unspecified: Secondary | ICD-10-CM | POA: Diagnosis not present

## 2020-01-18 DIAGNOSIS — R748 Abnormal levels of other serum enzymes: Secondary | ICD-10-CM | POA: Diagnosis not present

## 2020-01-18 LAB — CYTOLOGY - PAP
Comment: NEGATIVE
Diagnosis: NEGATIVE
High risk HPV: NEGATIVE

## 2020-01-19 ENCOUNTER — Other Ambulatory Visit: Payer: Self-pay

## 2020-01-19 ENCOUNTER — Other Ambulatory Visit (INDEPENDENT_AMBULATORY_CARE_PROVIDER_SITE_OTHER): Payer: 59

## 2020-01-19 DIAGNOSIS — R748 Abnormal levels of other serum enzymes: Secondary | ICD-10-CM

## 2020-01-19 DIAGNOSIS — E785 Hyperlipidemia, unspecified: Secondary | ICD-10-CM

## 2020-01-19 DIAGNOSIS — Z79899 Other long term (current) drug therapy: Secondary | ICD-10-CM

## 2020-01-20 LAB — LIPID PANEL
Chol/HDL Ratio: 4.1 ratio (ref 0.0–4.4)
Cholesterol, Total: 309 mg/dL — ABNORMAL HIGH (ref 100–199)
HDL: 75 mg/dL (ref >39)
LDL Chol Calc (NIH): 211 mg/dL — ABNORMAL HIGH (ref 0–99)
Triglycerides: 130 mg/dL (ref 0–149)
VLDL Cholesterol Cal: 23 mg/dL (ref 5–40)

## 2020-01-20 LAB — COMPREHENSIVE METABOLIC PANEL
ALT: 39 IU/L — ABNORMAL HIGH (ref 0–32)
AST: 32 IU/L (ref 0–40)
Albumin/Globulin Ratio: 1.8 (ref 1.2–2.2)
Albumin: 4.6 g/dL (ref 3.8–4.9)
Alkaline Phosphatase: 48 IU/L (ref 44–121)
BUN/Creatinine Ratio: 14 (ref 9–23)
BUN: 11 mg/dL (ref 6–24)
Bilirubin Total: 0.4 mg/dL (ref 0.0–1.2)
CO2: 26 mmol/L (ref 20–29)
Calcium: 9.9 mg/dL (ref 8.7–10.2)
Chloride: 101 mmol/L (ref 96–106)
Creatinine, Ser: 0.81 mg/dL (ref 0.57–1.00)
GFR calc Af Amer: 97 mL/min/{1.73_m2} (ref >59)
GFR calc non Af Amer: 84 mL/min/{1.73_m2} (ref >59)
Globulin, Total: 2.6 g/dL (ref 1.5–4.5)
Glucose: 86 mg/dL (ref 65–99)
Potassium: 4.3 mmol/L (ref 3.5–5.2)
Sodium: 144 mmol/L (ref 134–144)
Total Protein: 7.2 g/dL (ref 6.0–8.5)

## 2020-02-08 MED FILL — LEVOTHYROXINE 50 MCG TABLET: 50 | 90 days supply | Qty: 90 | Fill #0

## 2020-04-11 MED FILL — ZOLPIDEM TARTRATE 5 MG TAB: 5 | 30 days supply | Qty: 30 | Fill #0

## 2020-05-04 ENCOUNTER — Ambulatory Visit: Payer: 59 | Admitting: Cardiology

## 2020-05-09 MED FILL — LEVOTHYROXINE SODIUM 50 MCG: 50 | 90 days supply | Qty: 90 | Fill #1

## 2020-05-17 NOTE — Progress Notes (Signed)
Cardiology Office Note:    Date:  05/19/2020   ID:  Angela Wells, DOB 04-Dec-1970, MRN [id number]  PCP:  Pcp, No   [facility]  Cardiologist:  No primary care provider on file.  Advanced Practice Provider:  No care team member to display Electrophysiologist:  None    Referring MD: [name], MD    History of Present Illness:    Angela Wells is a 50 y.o. female with a hx of HLD and prior tobacco use who was referred by Dr. Sabra Heck for further evaluation of HLD.  The patient states that her PCP was concerned about her cholesterol. LDL notably in 211, TC 309, HDL 75, TG 130. Even at her lowest weight, LDL was in 180s. Denies any chest pain on exertion, SOB, nausea, vomiting, lightheadedness, or LE edema. [place] without exertional symptoms. Family history notable for significantly elevated cholesterol in her father and paternal grandparents who had Mis in their 69s.  Family history: Father with HLD, SSS and PPM; paternal GM/GF with MI in 63s; Mother has HTN. Maternal grandmother with CAD s/p CABG in her 33s.   Past Medical History:  Diagnosis Date   Abnormal Pap smear    Anxiety    Arthritis    hands   Depression    Dyslipidemia with elevated low density lipoprotein (LDL) cholesterol and abnormally low high density lipoprotein cholesterol    diet controlled   Eczema    Hypertension    resolved - lost weight, no med   Hyperthyroidism    SVD (spontaneous vaginal delivery)    x 2    Past Surgical History:  Procedure Laterality Date   DILATATION & CURETTAGE/HYSTEROSCOPY WITH MYOSURE N/A 06/16/2015   Procedure: DILATATION & CURETTAGE/HYSTEROSCOPY WITH Novasure;  Surgeon: [name], MD;  Location: [facility];  Service: Gynecology;  Laterality: N/A;   GYNECOLOGIC CRYOSURGERY     LAPAROSCOPIC TUBAL LIGATION  03/04/2011   Procedure: LAPAROSCOPIC TUBAL LIGATION;  Surgeon: Felipa Emory;  Location: [facility];  Service:  Gynecology;  Laterality: Bilateral;  with filshie clips   TUBAL LIGATION  03/04/2011   Procedure: ESSURE TUBAL STERILIZATION;  Surgeon: Felipa Emory;  Location: [facility];  Service: Gynecology;  Laterality: Bilateral;  Attempted   WISDOM TOOTH EXTRACTION      Current Medications: Current Meds  Medication Sig   acetaminophen (TYLENOL) 325 MG tablet Take 650 mg by mouth every 6 (six) hours as needed for mild pain or headache.   BL EVENING PRIMROSE OIL PO Take 1 capsule by mouth 2 (two) times daily. Reported on 06/05/2015   BLACK COHOSH PO Take 1 tablet by mouth 2 (two) times daily. Reported on 06/05/2015   Cyanocobalamin (VITAMIN B 12) 100 MCG LOZG Take by mouth.   DHA-EPA-Vitamin E 192-251-11 MG-MG-UNIT CAPS Take 2 capsules by mouth 2 (two) times daily. Reported on 06/05/2015   gabapentin (NEURONTIN) 100 MG capsule Take 100 mg by mouth at bedtime.   ibuprofen (ADVIL) 400 MG tablet Take 400 mg by mouth every 6 (six) hours as needed.   levothyroxine (SYNTHROID) 50 MCG tablet Take 1 tablet (50 mcg total) by mouth daily.   Multiple Vitamins-Minerals (MULTIVITAMINS THER. W/MINERALS) TABS Take 1 tablet by mouth daily. Reported on 06/05/2015   Nutritional Supplements (CATALYTIC FORMULA) TABS Take 1 tablet by mouth daily. Reported on 06/05/2015   rosuvastatin (CRESTOR) 20 MG tablet Take 1 tablet (20 mg total) by mouth daily.  zolpidem (AMBIEN) 5 MG tablet 1/2 to 1 tab qhs prn insomnia     Allergies:   Patient has no known allergies.   Social History   Socioeconomic History   Marital status: Married    Spouse name: Not on file   Number of children: Not on file   Years of education: Not on file   Highest education level: Not on file  Occupational History   Not on file  Tobacco Use   Smoking status: Former Smoker    Packs/day: 0.25    Years: 13.00    Pack years: 3.25    Types: Cigarettes    Quit date: 03/18/1998    Years since quitting: 22.1   Smokeless tobacco: Never  Used  Scientific laboratory technician Use: Never used  Substance and Sexual Activity   Alcohol use: Yes    Alcohol/week: 0.0 - 1.0 standard drinks   Drug use: No   Sexual activity: Yes    Partners: Male    Birth control/protection: Surgical    Comment: BTL/Ablation  Other Topics Concern   Not on file  Social History Narrative   Not on file   Social Determinants of Health   Financial Resource Strain: Not on file  Food Insecurity: Not on file  Transportation Needs: Not on file  Physical Activity: Not on file  Stress: Not on file  Social Connections: Not on file     Family History: The patient's family history includes Colon polyps in her father; Healthy in her daughter, sister, and son; Hyperlipidemia in her father and sister; Hypertension in her father and sister; Thyroid disease in her mother. There is no history of Colon cancer, Esophageal cancer, Rectal cancer, or Stomach cancer.  ROS:   Please see the history of present illness.    Review of Systems  Constitutional: Positive for malaise/fatigue. Negative for chills and fever.  HENT: Negative for hearing loss.   Eyes: Negative for blurred vision and redness.  Respiratory: Negative for shortness of breath.   Cardiovascular: Negative for chest pain, palpitations, orthopnea, claudication, leg swelling and PND.  Gastrointestinal: Negative for nausea and vomiting.  Genitourinary: Negative for dysuria and flank pain.  Musculoskeletal: Negative for falls.  Neurological: Negative for dizziness and loss of consciousness.  Endo/Heme/Allergies: Negative for polydipsia.  Psychiatric/Behavioral: Positive for depression.    EKGs/Labs/Other Studies Reviewed:    The following studies were reviewed today: CT abd/pelvis 12/20/18:  FINDINGS: Lower chest: Unremarkable.  Hepatobiliary: No definite signs of acute traumatic injury to the liver. In segment 3 of the liver (axial image 17 of series 3) there is a 1.4 x 0.9 cm centrally  low-attenuation lesion with peripheral nodular hyperenhancement and progressive filling on delayed images, compatible with a small cavernous hemangioma. No other suspicious hepatic lesions. No intra or extrahepatic biliary ductal dilatation. Gallbladder is normal in appearance.  Pancreas: No pancreatic mass. No pancreatic ductal dilatation. No pancreatic or peripancreatic fluid collections or inflammatory changes.  Spleen: Unremarkable.  Adrenals/Urinary Tract: Bilateral kidneys and adrenal glands are normal in appearance. No hydroureteronephrosis. Urinary bladder is normal in appearance.  Stomach/Bowel: Normal appearance of the stomach. No pathologic dilatation of small bowel or colon. Normal appendix.  Vascular/Lymphatic: Aortic atherosclerosis, without evidence of aneurysm or dissection in the abdominal or pelvic vasculature. Circumaortic left renal vein (normal anatomical vein) incidentally noted. No lymphadenopathy noted in the abdomen or pelvis.  Reproductive: Tubal ligation clips are noted. Uterus and ovaries are otherwise unremarkable in appearance.  Other: No significant volume  of ascites.  No pneumoperitoneum.  Musculoskeletal: There are no aggressive appearing lytic or blastic lesions noted in the visualized portions of the skeleton.  IMPRESSION: 1. No signs of significant acute traumatic injury to the abdomen or pelvis. 2. 1.4 x 0.9 cm lesion in segment 3 of the liver with imaging characteristics compatible with a small cavernous hemangioma. 3. Aortic atherosclerosis.   EKG:  EKG is  ordered today.  The ekg ordered today demonstrates sinus bradycardia with HR 59bpm  Recent Labs: 08/17/2019: TSH 3.90 01/19/2020: ALT 39; BUN 11; Creatinine, Ser 0.81; Potassium 4.3; Sodium 144  Recent Lipid Panel    Component Value Date/Time   CHOL 309 (H) 01/19/2020 0951   TRIG 130 01/19/2020 0951   HDL 75 01/19/2020 0951   CHOLHDL 4.1 01/19/2020 0951   CHOLHDL 3.6  07/04/2016 0943   VLDL 16 07/04/2016 0943   LDLCALC 211 (H) 01/19/2020 0951     Risk Assessment/Calculations:       Physical Exam:    VS:  BP 120/80    Pulse (!) 59    Ht 5\' 7"  (1.702 m)    Wt 187 lb 9.6 oz (85.1 kg)    SpO2 96%    BMI 29.38 kg/m     Wt Readings from Last 3 Encounters:  05/19/20 187 lb 9.6 oz (85.1 kg)  01/17/20 184 lb (83.5 kg)  08/17/19 174 lb 12.8 oz (79.3 kg)     GEN:  Well nourished, well developed in no acute distress HEENT: Normal NECK: No JVD; No carotid bruits CARDIAC: RRR, no murmurs, rubs, gallops RESPIRATORY:  Clear to auscultation without rales, wheezing or rhonchi  ABDOMEN: Soft, non-tender, non-distended MUSCULOSKELETAL:  No edema; No deformity  SKIN: Warm and dry NEUROLOGIC:  Alert and oriented x 3 PSYCHIATRIC:  Normal affect   ASSESSMENT:    1. Mixed hyperlipidemia   2. Aortic atherosclerosis (HCC)    PLAN:    In order of problems listed above:  #HLD #Aortic Atherosclerosis on CT: Patient with LDL 211 with positive family history of HLD in her father and paternal grandparents who both had myocardial infarctions in their 22s. History and LDL concerning for familial hypercholesterolemia. She is fortunately asymptomatic and active without exertional symptoms. Will start statin therapy and refer for coronary calcium score. -Start crestor 20mg  daily; goal LDL <100 -If not at goal with crestor, will add zetia -Coronary calcium score -Continue aggressive risk factor modification with healthy diet and exercise as detailed below  Exercise recommendations: Goal of exercising for at least 30 minutes a day, at least 5 times per week.  Please exercise to a moderate exertion.  This means that while exercising it is difficult to speak in full sentences, however you are not so short of breath that you feel you must stop, and not so comfortable that you can carry on a full conversation.  Exertion level should be approximately a 5/10, if 10 is the  most exertion you can perform.  Diet recommendations: Recommend a heart healthy diet such as the Mediterranean diet.  This diet consists of plant based foods, healthy fats, lean meats, olive oil.  It suggests limiting the intake of simple carbohydrates such as white breads, pastries, and pastas.  It also limits the amount of red meat, wine, and dairy products such as cheese that one should consume on a daily basis.   Medication Adjustments/Labs and Tests Ordered: Current medicines are reviewed at length with the patient today.  Concerns regarding medicines are outlined above.  Orders Placed This Encounter  Procedures   CT CARDIAC SCORING (SELF PAY ONLY)   Lipid panel   Hepatic function panel   EKG 12-Lead   Meds ordered this encounter  Medications   rosuvastatin (CRESTOR) 20 MG tablet    Sig: Take 1 tablet (20 mg total) by mouth daily.    Dispense:  90 tablet    Refill:  3    Patient Instructions  Medication Instructions:  Your physician has recommended you make the following change in your medication:  1.  START Crestor 20 mg taking 1 daily   *If you need a refill on your cardiac medications before your next appointment, please call your pharmacy*   Lab Work: 06/26/2020:  COME TO THE OFFICE FASTING, FOR:  LIPID / LFT. (anytime after 7:30 a.m.)  If you have labs (blood work) drawn today and your tests are completely normal, you will receive your results only by:  MyChart Message (if you have MyChart) OR  A paper copy in the mail If you have any lab test that is abnormal or we need to change your treatment, we will call you to review the results.   Testing/Procedures: Your physician recommends you have a Cardiac CT Calcium Scoring done.  It is an out of pocket expense of $99.   Follow-Up: At [facility], you and your health needs are our priority.  As part of our continuing mission to provide you with exceptional heart care, we have created designated Provider  Care Teams.  These Care Teams include your primary Cardiologist (physician) and Advanced Practice Providers (APPs -  Physician Assistants and Nurse Practitioners) who all work together to provide you with the care you need, when you need it.  We recommend signing up for the patient portal called "MyChart".  Sign up information is provided on this After Visit Summary.  MyChart is used to connect with patients for Virtual Visits (Telemedicine).  Patients are able to view lab/test results, encounter notes, upcoming appointments, etc.  Non-urgent messages can be sent to your provider as well.   To learn more about what you can do with MyChart, go to [email or web address].    Your next appointment:   6 month(s)  The format for your next appointment:   In Person  Provider:   Gwyndolyn Kaufman, MD   Other Instructions      Signed, Freada Bergeron, MD  05/19/2020 9:54 AM    [place]

## 2020-05-19 ENCOUNTER — Other Ambulatory Visit: Payer: Self-pay | Admitting: Cardiology

## 2020-05-19 ENCOUNTER — Ambulatory Visit (INDEPENDENT_AMBULATORY_CARE_PROVIDER_SITE_OTHER): Payer: 59 | Admitting: Cardiology

## 2020-05-19 ENCOUNTER — Other Ambulatory Visit: Payer: Self-pay

## 2020-05-19 ENCOUNTER — Encounter: Payer: Self-pay | Admitting: Cardiology

## 2020-05-19 ENCOUNTER — Ambulatory Visit: Payer: 59 | Admitting: Cardiology

## 2020-05-19 VITALS — BP 120/80 | HR 59 | Ht 67.0 in | Wt 187.6 lb

## 2020-05-19 DIAGNOSIS — E782 Mixed hyperlipidemia: Secondary | ICD-10-CM

## 2020-05-19 DIAGNOSIS — I700 Atherosclerosis of aorta: Secondary | ICD-10-CM

## 2020-05-19 MED ORDER — ROSUVASTATIN CALCIUM 20 MG PO TABS: 20.0000 mg | ORAL_TABLET | Freq: Every day | ORAL | 3 refills | Status: DC

## 2020-05-19 MED FILL — ROSUVASTATIN CALCIUM 20 MG: 20 | 90 days supply | Qty: 90 | Fill #0

## 2020-05-19 NOTE — Patient Instructions (Signed)
Medication Instructions:  Your physician has recommended you make the following change in your medication:  1.  START Crestor 20 mg taking 1 daily   *If you need a refill on your cardiac medications before your next appointment, please call your pharmacy*   Lab Work: 06/26/2020:  COME TO THE OFFICE FASTING, FOR:  LIPID / LFT. (anytime after 7:30 a.m.)  If you have labs (blood work) drawn today and your tests are completely normal, you will receive your results only by: Marland Kitchen MyChart Message (if you have MyChart) OR . A paper copy in the mail If you have any lab test that is abnormal or we need to change your treatment, we will call you to review the results.   Testing/Procedures: Your physician recommends you have a Cardiac CT Calcium Scoring done.  It is an out of pocket expense of $99.   Follow-Up: At [facility], you and your health needs are our priority.  As part of our continuing mission to provide you with exceptional heart care, we have created designated Provider Care Teams.  These Care Teams include your primary Cardiologist (physician) and Advanced Practice Providers (APPs -  Physician Assistants and Nurse Practitioners) who all work together to provide you with the care you need, when you need it.  We recommend signing up for the patient portal called "MyChart".  Sign up information is provided on this After Visit Summary.  MyChart is used to connect with patients for Virtual Visits (Telemedicine).  Patients are able to view lab/test results, encounter notes, upcoming appointments, etc.  Non-urgent messages can be sent to your provider as well.   To learn more about what you can do with MyChart, go to [email or web address].    Your next appointment:   6 month(s)  The format for your next appointment:   In Person  Provider:   Gwyndolyn Kaufman, MD   Other Instructions

## 2020-06-02 ENCOUNTER — Other Ambulatory Visit: Payer: Self-pay

## 2020-06-02 ENCOUNTER — Other Ambulatory Visit: Payer: Self-pay | Admitting: Nurse Practitioner

## 2020-06-02 ENCOUNTER — Ambulatory Visit (INDEPENDENT_AMBULATORY_CARE_PROVIDER_SITE_OTHER): Payer: 59 | Admitting: Nurse Practitioner

## 2020-06-02 ENCOUNTER — Encounter: Payer: Self-pay | Admitting: Nurse Practitioner

## 2020-06-02 VITALS — BP 124/84 | HR 61 | Temp 99.0°F | Ht 67.0 in | Wt 187.9 lb

## 2020-06-02 DIAGNOSIS — F320 Major depressive disorder, single episode, mild: Secondary | ICD-10-CM

## 2020-06-02 DIAGNOSIS — E063 Autoimmune thyroiditis: Secondary | ICD-10-CM | POA: Diagnosis not present

## 2020-06-02 DIAGNOSIS — Z7689 Persons encountering health services in other specified circumstances: Secondary | ICD-10-CM | POA: Diagnosis not present

## 2020-06-02 MED ORDER — FLUOXETINE HCL 10 MG PO CAPS: 10.0000 mg | ORAL_CAPSULE | Freq: Every day | ORAL | 0 refills | Status: DC

## 2020-06-02 NOTE — Patient Instructions (Signed)

## 2020-06-02 NOTE — Progress Notes (Signed)
New Patient Office Visit  Subjective:  Patient ID: Angela Wells, female    DOB: [birth year]  Age: 50 y.o. MRN: [id number]  CC:  Chief Complaint  Patient presents with  . New Patient (Initial Visit)    HPI Angela Wells presents to establish new primary care provider. She does have a GYN provider. Last set of labs indicated high cholesterol. She was sent to cardiology and then started on crestor. She is taking 20mg  daily due to strong family history of high cholesterol and coronary artery disease. She is scheduled to have a CT calcium study in a few weeks. She denies chest pain, chest pressure, shortness of breath, or palpitations. She denies nausea, vomiting, or unusual diarrhea or constipation.  She states that she has really been having an issue with anxiety and depression. She states that the general feeling of overwhelm has become worse over the last year. She has found she is more irritable and snaps much more easily at friends and family members. She states that she has tried wellbutrin in the past. This medication gave her serious headaches. She stopped and has not tried anything since then. She is open to trying other medications. She would like something that will not increase weight gain or decrease her sex drive.   [place] Office Visit from 06/02/2020 in [place] at [facility] Visit from 03/24/2017 in [place] at [facility] Visit from 12/12/2016 in [place] at [facility] that you would be better off dead, or of hurting yourself in some way Not at all Not at all Not at all  PHQ-9 Total Score 4 5 5      She does see endocrinology for diagnosis of Hashimoto's thyroiditis.   Past Medical History:  Diagnosis Date  . Abnormal Pap smear   . Anxiety   . Arthritis    hands  . Depression   . Dyslipidemia with elevated low density lipoprotein (LDL) cholesterol and abnormally low high density lipoprotein  cholesterol    diet controlled  . Eczema   . High cholesterol   . Hypertension    resolved - lost weight, no med  . Hyperthyroidism   . SVD (spontaneous vaginal delivery)    x 2    Past Surgical History:  Procedure Laterality Date  . DILATATION & CURETTAGE/HYSTEROSCOPY WITH MYOSURE N/A 06/16/2015   Procedure: DILATATION & CURETTAGE/HYSTEROSCOPY WITH Novasure;  Surgeon: [name], MD;  Location: [facility];  Service: Gynecology;  Laterality: N/A;  . GYNECOLOGIC CRYOSURGERY    . LAPAROSCOPIC TUBAL LIGATION  03/04/2011   Procedure: LAPAROSCOPIC TUBAL LIGATION;  Surgeon: Felipa Emory;  Location: [facility];  Service: Gynecology;  Laterality: Bilateral;  with filshie clips  . TUBAL LIGATION  03/04/2011   Procedure: ESSURE TUBAL STERILIZATION;  Surgeon: Felipa Emory;  Location: [facility];  Service: Gynecology;  Laterality: Bilateral;  Attempted  . WISDOM TOOTH EXTRACTION      Family History  Problem Relation Age of Onset  . Hypertension Father   . Hyperlipidemia Father   . Colon polyps Father   . Thyroid disease Mother   . Hypertension Sister   . Hyperlipidemia Sister   . Healthy Sister   . Healthy Daughter   . Healthy Son   . Colon cancer Neg Hx   . Esophageal cancer Neg Hx   . Rectal cancer Neg Hx   . Stomach cancer Neg Hx  Social History   Socioeconomic History  . Marital status: Married    Spouse name: Not on file  . Number of children: Not on file  . Years of education: Not on file  . Highest education level: Not on file  Occupational History  . Not on file  Tobacco Use  . Smoking status: Former Smoker    Packs/day: 0.25    Years: 13.00    Pack years: 3.25    Types: Cigarettes    Quit date: 03/18/1998    Years since quitting: 22.2  . Smokeless tobacco: Never Used  Vaping Use  . Vaping Use: Never used  Substance and Sexual Activity  . Alcohol use: Yes    Alcohol/week: 0.0 - 1.0 standard drinks  . Drug use: No  . Sexual activity: Yes    Partners: Male     Birth control/protection: Surgical    Comment: BTL/Ablation  Other Topics Concern  . Not on file  Social History Narrative  . Not on file   Social Determinants of Health   Financial Resource Strain: Not on file  Food Insecurity: Not on file  Transportation Needs: Not on file  Physical Activity: Not on file  Stress: Not on file  Social Connections: Not on file  Intimate Partner Violence: Not on file    ROS Review of Systems  Constitutional: Positive for unexpected weight change. Negative for activity change, chills and fever.       States that she has gradually been gaining weight over the past several months.   HENT: Negative.  Negative for congestion and sinus pain.   Respiratory: Negative.  Negative for cough and wheezing.   Cardiovascular: Negative.  Negative for chest pain and palpitations.  Gastrointestinal: Negative for constipation.  Endocrine:       Sees endocrinology for Hashimoto's thyroiditis   Musculoskeletal: Negative.  Negative for back pain and myalgias.  Skin: Negative for rash.  Allergic/Immunologic: Negative.   Neurological: Negative.  Negative for dizziness, weakness and headaches.  Psychiatric/Behavioral: Positive for dysphoric mood. The patient is nervous/anxious.        Denies feeling suicidal. She does not want th hurt herself or anyone else.   All other systems reviewed and are negative.   Objective:   Today's Vitals   06/02/20 0900  BP: 124/84  Pulse: 61  Temp: 99 F (37.2 C)  SpO2: 95%  Weight: 187 lb 14.4 oz (85.2 kg)  Height: 5\' 7"  (1.702 m)   Body mass index is 29.43 kg/m.    Physical Exam Vitals and nursing note reviewed.  Constitutional:      Appearance: Normal appearance. She is well-developed.  HENT:     Head: Normocephalic and atraumatic.     Nose: Nose normal.  Eyes:     Pupils: Pupils are equal, round, and reactive to light.  Neck:     Vascular: No carotid bruit.  Cardiovascular:     Rate and Rhythm: Normal rate  and regular rhythm.     Pulses: Normal pulses.     Heart sounds: Normal heart sounds.  Pulmonary:     Effort: Pulmonary effort is normal.     Breath sounds: Normal breath sounds.  Abdominal:     General: Bowel sounds are normal.     Palpations: Abdomen is soft.     Tenderness: There is no abdominal tenderness.  Musculoskeletal:        General: Normal range of motion.     Cervical back: Normal range of motion and  neck supple.  Skin:    General: Skin is warm and dry.  Neurological:     General: No focal deficit present.     Mental Status: She is alert and oriented to person, place, and time.  Psychiatric:        Mood and Affect: Mood normal.        Behavior: Behavior normal.        Thought Content: Thought content normal.        Judgment: Judgment normal.     Assessment & Plan:  1. Encounter to establish care Appointment today to establish primary care provider.   2. Hashimoto's thyroiditis Continue regular visits with endocrinology as scheduled.   3. Mild major depression (HCC) Trial of fluoxetine 10mg  daily.  - FLUoxetine (PROZAC) 10 MG capsule; Take 1 capsule (10 mg total) by mouth daily.  Dispense: 90 capsule; Refill: 0   Problem List Items Addressed This Visit      Endocrine   Hashimoto's thyroiditis     Other   Encounter to establish care - Primary   Mild major depression ([place])   Relevant Medications   FLUoxetine (PROZAC) 10 MG capsule      Outpatient Encounter Medications as of 06/02/2020  Medication Sig  . acetaminophen (TYLENOL) 325 MG tablet Take 650 mg by mouth every 6 (six) hours as needed for mild pain or headache.  . BL EVENING PRIMROSE OIL PO Take 1 capsule by mouth 2 (two) times daily. Reported on 06/05/2015  . BLACK COHOSH PO Take 1 tablet by mouth 2 (two) times daily. Reported on 06/05/2015  . Cyanocobalamin (VITAMIN B 12) 100 MCG LOZG Take by mouth.  . DHA-EPA-Vitamin E 192-251-11 MG-MG-UNIT CAPS Take 2 capsules by mouth 2 (two) times daily.  Reported on 06/05/2015  . FLUoxetine (PROZAC) 10 MG capsule Take 1 capsule (10 mg total) by mouth daily.  Marland Kitchen gabapentin (NEURONTIN) 100 MG capsule Take 100 mg by mouth at bedtime.  Marland Kitchen ibuprofen (ADVIL) 400 MG tablet Take 400 mg by mouth every 6 (six) hours as needed.  Marland Kitchen levothyroxine (SYNTHROID) 50 MCG tablet Take 1 tablet (50 mcg total) by mouth daily.  . Multiple Vitamins-Minerals (MULTIVITAMINS THER. W/MINERALS) TABS Take 1 tablet by mouth daily. Reported on 06/05/2015  . Nutritional Supplements (CATALYTIC FORMULA) TABS Take 1 tablet by mouth daily. Reported on 06/05/2015  . rosuvastatin (CRESTOR) 20 MG tablet Take 1 tablet (20 mg total) by mouth daily.  Marland Kitchen zolpidem (AMBIEN) 5 MG tablet 1/2 to 1 tab qhs prn insomnia   No facility-administered encounter medications on file as of 06/02/2020.   Time spent with the patient was approximately 30 minutes. This time included reviewing progress notes, labs, imaging studies, and discussing plan for follow up.   Follow-up: Return in about 4 weeks (around 06/30/2020) for mood. started prozac .   Ronnell Freshwater, NP

## 2020-06-05 DIAGNOSIS — F320 Major depressive disorder, single episode, mild: Secondary | ICD-10-CM | POA: Insufficient documentation

## 2020-06-05 DIAGNOSIS — Z7689 Persons encountering health services in other specified circumstances: Secondary | ICD-10-CM | POA: Insufficient documentation

## 2020-06-05 DIAGNOSIS — E063 Autoimmune thyroiditis: Secondary | ICD-10-CM | POA: Insufficient documentation

## 2020-06-12 ENCOUNTER — Encounter: Payer: Self-pay | Admitting: Nurse Practitioner

## 2020-06-16 ENCOUNTER — Ambulatory Visit (INDEPENDENT_AMBULATORY_CARE_PROVIDER_SITE_OTHER)
Admission: RE | Admit: 2020-06-16 | Discharge: 2020-06-16 | Disposition: A | Discharge status: Home / Self Care | Payer: Self-pay | Source: Ambulatory Visit | Attending: Cardiology | Admitting: Cardiology

## 2020-06-16 ENCOUNTER — Other Ambulatory Visit: Payer: Self-pay

## 2020-06-16 DIAGNOSIS — E782 Mixed hyperlipidemia: Secondary | ICD-10-CM

## 2020-06-16 DIAGNOSIS — I700 Atherosclerosis of aorta: Secondary | ICD-10-CM

## 2020-06-26 ENCOUNTER — Other Ambulatory Visit: Payer: 59

## 2020-06-30 ENCOUNTER — Ambulatory Visit: Payer: 59 | Admitting: Nurse Practitioner

## 2020-07-12 ENCOUNTER — Other Ambulatory Visit (HOSPITAL_BASED_OUTPATIENT_CLINIC_OR_DEPARTMENT_OTHER): Payer: Self-pay

## 2020-07-12 MED FILL — Zolpidem Tartrate Tab 5 MG: ORAL | 30 days supply | Qty: 30 | Fill #0 | Status: AC

## 2020-07-14 ENCOUNTER — Ambulatory Visit: Payer: 59 | Admitting: Nurse Practitioner

## 2020-07-21 ENCOUNTER — Other Ambulatory Visit: Payer: Self-pay

## 2020-07-21 ENCOUNTER — Encounter: Payer: Self-pay | Admitting: Nurse Practitioner

## 2020-07-21 ENCOUNTER — Other Ambulatory Visit (HOSPITAL_BASED_OUTPATIENT_CLINIC_OR_DEPARTMENT_OTHER): Payer: Self-pay

## 2020-07-21 ENCOUNTER — Ambulatory Visit: Payer: 59 | Admitting: Nurse Practitioner

## 2020-07-21 VITALS — BP 119/72 | HR 55 | Temp 97.9°F | Ht 67.0 in | Wt 187.2 lb

## 2020-07-21 DIAGNOSIS — E785 Hyperlipidemia, unspecified: Secondary | ICD-10-CM | POA: Diagnosis not present

## 2020-07-21 DIAGNOSIS — I700 Atherosclerosis of aorta: Secondary | ICD-10-CM | POA: Diagnosis not present

## 2020-07-21 DIAGNOSIS — E063 Autoimmune thyroiditis: Secondary | ICD-10-CM

## 2020-07-21 DIAGNOSIS — F320 Major depressive disorder, single episode, mild: Secondary | ICD-10-CM

## 2020-07-21 DIAGNOSIS — E782 Mixed hyperlipidemia: Secondary | ICD-10-CM | POA: Diagnosis not present

## 2020-07-21 MED ORDER — FLUOXETINE HCL 20 MG PO CAPS
20.0000 mg | ORAL_CAPSULE | Freq: Every day | ORAL | 3 refills | Status: DC
  Filled 2020-07-21 – 2020-08-02 (×2): qty 90, 90d supply, fill #0

## 2020-07-21 NOTE — Progress Notes (Signed)
Established Patient Office Visit  Subjective:  Patient ID: Angela Wells, female    DOB: Apr 19, 1970  Age: 50 y.o. MRN: [id number]  CC:  Chief Complaint  Patient presents with  . Follow-up  . mood    HPI Angela Wells presents for follow up of hyperlipidemia and depression. She currently takes crestor 20mg  per cardiology. She started on this in November 2021. She has had some joint and muscle pain for the past few weeks, but no other negative side effects. She does have a family history of hyperlipidemia. She has not had a repeat cholesterol panel since starting on crestor.  She was started on prozac 10mg  daily at her last visit. She states that it took a few weeks, but has noted an improvement. States that this medication has definitely taken the edge off of her mood most days. She states that she does have a few bad days each week, but, in general, mood is better. She states that she has had no negative side effects from taking this medication.  She states that she has no new concerns or complaints today.   Past Medical History:  Diagnosis Date  . Abnormal Pap smear   . Anxiety   . Arthritis    hands  . Depression   . Dyslipidemia with elevated low density lipoprotein (LDL) cholesterol and abnormally low high density lipoprotein cholesterol    diet controlled  . Eczema   . High cholesterol   . Hypertension    resolved - lost weight, no med  . Hyperthyroidism   . SVD (spontaneous vaginal delivery)    x 2    Past Surgical History:  Procedure Laterality Date  . DILATATION & CURETTAGE/HYSTEROSCOPY WITH MYOSURE N/A 06/16/2015   Procedure: DILATATION & CURETTAGE/HYSTEROSCOPY WITH Novasure;  Surgeon: [name], MD;  Location: [facility];  Service: Gynecology;  Laterality: N/A;  . GYNECOLOGIC CRYOSURGERY    . LAPAROSCOPIC TUBAL LIGATION  03/04/2011   Procedure: LAPAROSCOPIC TUBAL LIGATION;  Surgeon: Felipa Emory;  Location: [facility];  Service: Gynecology;  Laterality: Bilateral;   with filshie clips  . TUBAL LIGATION  03/04/2011   Procedure: ESSURE TUBAL STERILIZATION;  Surgeon: Felipa Emory;  Location: [facility];  Service: Gynecology;  Laterality: Bilateral;  Attempted  . WISDOM TOOTH EXTRACTION      Family History  Problem Relation Age of Onset  . Hypertension Father   . Hyperlipidemia Father   . Colon polyps Father   . Thyroid disease Mother   . Hypertension Sister   . Hyperlipidemia Sister   . Healthy Sister   . Healthy Daughter   . Healthy Son   . Colon cancer Neg Hx   . Esophageal cancer Neg Hx   . Rectal cancer Neg Hx   . Stomach cancer Neg Hx     Social History   Socioeconomic History  . Marital status: Married    Spouse name: Not on file  . Number of children: Not on file  . Years of education: Not on file  . Highest education level: Not on file  Occupational History  . Not on file  Tobacco Use  . Smoking status: Former Smoker    Packs/day: 0.25    Years: 13.00    Pack years: 3.25    Types: Cigarettes    Quit date: 03/18/1998    Years since quitting: 22.4  . Smokeless tobacco: Never Used  Vaping Use  . Vaping Use: Never used  Substance and Sexual Activity  .  Alcohol use: Yes    Alcohol/week: 0.0 - 1.0 standard drinks  . Drug use: No  . Sexual activity: Yes    Partners: Male    Birth control/protection: Surgical    Comment: BTL/Ablation  Other Topics Concern  . Not on file  Social History Narrative  . Not on file   Social Determinants of Health   Financial Resource Strain: Not on file  Food Insecurity: Not on file  Transportation Needs: Not on file  Physical Activity: Not on file  Stress: Not on file  Social Connections: Not on file  Intimate Partner Violence: Not on file    Outpatient Medications Prior to Visit  Medication Sig Dispense Refill  . acetaminophen (TYLENOL) 325 MG tablet Take 650 mg by mouth every 6 (six) hours as needed for mild pain or headache.    . BL EVENING PRIMROSE OIL PO Take 1 capsule by mouth  2 (two) times daily. Reported on 06/05/2015    . BLACK COHOSH PO Take 1 tablet by mouth 2 (two) times daily. Reported on 06/05/2015    . Cyanocobalamin (VITAMIN B 12) 100 MCG LOZG Take by mouth.    . DHA-EPA-Vitamin E 192-251-11 MG-MG-UNIT CAPS Take 2 capsules by mouth 2 (two) times daily. Reported on 06/05/2015    . gabapentin (NEURONTIN) 100 MG capsule Take 100 mg by mouth at bedtime.    Marland Kitchen ibuprofen (ADVIL) 400 MG tablet Take 400 mg by mouth every 6 (six) hours as needed.    Marland Kitchen levothyroxine (SYNTHROID) 50 MCG tablet TAKE 1 TABLET (50 MCG TOTAL) BY MOUTH DAILY. 90 tablet 2  . Multiple Vitamins-Minerals (MULTIVITAMINS THER. W/MINERALS) TABS Take 1 tablet by mouth daily. Reported on 06/05/2015    . Nutritional Supplements (CATALYTIC FORMULA) TABS Take 1 tablet by mouth daily. Reported on 06/05/2015    . zolpidem (AMBIEN) 5 MG tablet TAKE 1/2 TO 1 TABLET BY MOUTH EVERY NIGHT AT BEDTIME AS NEEDED FOR INSOMNIA 30 tablet 2  . FLUoxetine (PROZAC) 10 MG capsule TAKE 1 CAPSULE BY MOUTH ONCE DAILY 90 capsule 0  . rosuvastatin (CRESTOR) 20 MG tablet TAKE 1 TABLET (20 MG TOTAL) BY MOUTH DAILY. 90 tablet 3   No facility-administered medications prior to visit.    Not on File  ROS Review of Systems  Constitutional: Negative for activity change, chills and fever.  HENT: Negative for congestion, postnasal drip, rhinorrhea, sinus pressure and sinus pain.   Eyes: Negative.   Respiratory: Negative for cough, chest tightness, shortness of breath and wheezing.   Cardiovascular: Negative for chest pain and palpitations.  Gastrointestinal: Negative for constipation.  Endocrine: Negative for cold intolerance, heat intolerance, polydipsia and polyuria.  Musculoskeletal: Positive for arthralgias. Negative for back pain and myalgias.       Intermittent, generalized joint and muscle pain.   Skin: Negative for rash.  Allergic/Immunologic: Negative.   Neurological: Negative for dizziness, weakness and headaches.   Psychiatric/Behavioral: Positive for dysphoric mood. The patient is nervous/anxious.        Mood improved since starting on prozac 10mg  daily.   All other systems reviewed and are negative.     Objective:    Physical Exam Vitals and nursing note reviewed.  Constitutional:      Appearance: Normal appearance. She is well-developed.  HENT:     Head: Normocephalic and atraumatic.     Nose: Nose normal.  Eyes:     Conjunctiva/sclera: Conjunctivae normal.     Pupils: Pupils are equal, round, and reactive to light.  Cardiovascular:     Rate and Rhythm: Normal rate and regular rhythm.     Pulses: Normal pulses.     Heart sounds: Normal heart sounds.  Pulmonary:     Effort: Pulmonary effort is normal.     Breath sounds: Normal breath sounds.  Abdominal:     Palpations: Abdomen is soft.  Musculoskeletal:        General: Normal range of motion.     Cervical back: Normal range of motion and neck supple.  Skin:    General: Skin is warm and dry.  Neurological:     General: No focal deficit present.     Mental Status: She is alert and oriented to person, place, and time.  Psychiatric:        Mood and Affect: Mood normal.        Behavior: Behavior normal.        Thought Content: Thought content normal.        Judgment: Judgment normal.     Today's Vitals   07/21/20 0847  BP: 119/72  Pulse: (!) 55  Temp: 97.9 F (36.6 C)  SpO2: 97%  Weight: 187 lb 3.2 oz (84.9 kg)  Height: 5\' 7"  (1.702 m)   Body mass index is 29.32 kg/m.   Wt Readings from Last 3 Encounters:  07/21/20 187 lb 3.2 oz (84.9 kg)  06/02/20 187 lb 14.4 oz (85.2 kg)  05/19/20 187 lb 9.6 oz (85.1 kg)     Health Maintenance Due  Topic Date Due  . COVID-19 Vaccine (1) Never done    There are no preventive care reminders to display for this patient.  Lab Results  Component Value Date   TSH 3.90 08/17/2019   Lab Results  Component Value Date   WBC 10.9 (H) 12/20/2018   HGB 13.6 12/20/2018   HCT 40.0  12/20/2018   MCV 92.9 12/20/2018   PLT 254 12/20/2018   Lab Results  Component Value Date   NA 144 01/19/2020   K 4.3 01/19/2020   CO2 26 01/19/2020   GLUCOSE 86 01/19/2020   BUN 11 01/19/2020   CREATININE 0.81 01/19/2020   BILITOT 0.4 07/21/2020   ALKPHOS 53 07/21/2020   AST 43 (H) 07/21/2020   ALT 63 (H) 07/21/2020   PROT 7.2 07/21/2020   ALBUMIN 4.9 07/21/2020   CALCIUM 9.9 01/19/2020   ANIONGAP 10 12/20/2018   Lab Results  Component Value Date   CHOL 161 07/21/2020   Lab Results  Component Value Date   HDL 82 07/21/2020   Lab Results  Component Value Date   LDLCALC 64 07/21/2020   Lab Results  Component Value Date   TRIG 82 07/21/2020   Lab Results  Component Value Date   CHOLHDL 2.0 07/21/2020   No results found for: HGBA1C    Assessment & Plan:  1. Dyslipidemia with elevated low density lipoprotein (LDL) cholesterol and abnormally low high density lipoprotein cholesterol Check lipid panel and adjust crestor dosing as indicated. Encouraged her to add CoQ10 daily to help reduce muscle and joint pain which may be associated with taking crestor.   2. Hashimoto's thyroiditis Continue levothyroxine as prescribed   3. Mild major depression (HCC) Improved, but could have better results. Increase prozac to 20mg  daily. Reassess at next visit.  - FLUoxetine (PROZAC) 20 MG capsule; Take 1 capsule (20 mg total) by mouth daily.  Dispense: 90 capsule; Refill: 3  Problem List Items Addressed This Visit      Endocrine  Hashimoto's thyroiditis     Other   Dyslipidemia with elevated low density lipoprotein (LDL) cholesterol and abnormally low high density lipoprotein cholesterol - Primary   Mild major depression (HCC)   Relevant Medications   FLUoxetine (PROZAC) 20 MG capsule      Meds ordered this encounter  Medications  . FLUoxetine (PROZAC) 20 MG capsule    Sig: Take 1 capsule (20 mg total) by mouth daily.    Dispense:  90 capsule    Refill:  3     Please note increase dose to 20mg  daily.    Order Specific Question:   Supervising Provider    Answer:   Beatrice Lecher D [[id number]    Follow-up: Return in about 3 months (around 10/21/2020) for mood/anxiety - increased prozac to 20mg  daily. Marland Kitchen    Ronnell Freshwater, NP

## 2020-07-21 NOTE — Patient Instructions (Addendum)
Major Depressive Disorder, Adult Major depressive disorder is a mental health condition. This disorder affects feelings. It can also affect the body. Symptoms of this condition last most of the day, almost every day, for 2 weeks. This disorder can affect:  Relationships.  Daily activities, such as work and school.  Activities that you normally like to do. What are the causes? The cause of this condition is not known. The disorder is likely caused by a mix of things, including:  Your personality, such as being a shy person.  Your behavior, or how you act toward others.  Your thoughts and feelings.  Too much alcohol or drugs.  How you react to stress.  Health and mental problems that you have had for a long time.  Things that hurt you in the past (trauma).  Big changes in your life, such as divorce. What increases the risk? The following factors may make you more likely to develop this condition:  Having family members with depression.  Being a woman.  Problems in the family.  Low levels of some brain chemicals.  Things that caused you pain as a child, especially if you lost a parent or were abused.  A lot of stress in your life, such as from: ? Living without basic needs of life, such as food and shelter. ? Being treated poorly because of race, sex, or religion (discrimination).  Health and mental problems that you have had for a long time. What are the signs or symptoms? The main symptoms of this condition are:  Being sad all the time.  Being grouchy all the time.  Loss of interest in things and activities. Other symptoms include:  Sleeping too much or too little.  Eating too much or too little.  Gaining or losing weight, without knowing why.  Feeling tired or having low energy.  Being restless and weak.  Feeling hopeless, worthless, or guilty.  Trouble thinking clearly or making decisions.  Thoughts of hurting yourself or others, or thoughts of  ending your life.  Spending a lot of time alone.  Inability to complete common tasks of daily life. If you have very bad MDD, you may:  Believe things that are not true.  Hear, see, taste, or feel things that are not there.  Have mild depression that lasts for at least 2 years.  Feel very sad and hopeless.  Have trouble speaking or moving. How is this treated? This condition may be treated with:  Talk therapy. This teaches you to know bad thoughts, feelings, and actions and how to change them. ? This can also help you to communicate with others. ? This can be done with members of your family.  Medicines. These can be used to treat worry (anxiety), depression, or low levels of chemicals in the brain.  Lifestyle changes. You may need to: ? Limit alcohol use. ? Limit drug use. ? Get regular exercise. ? Get plenty of sleep. ? Make healthy eating choices. ? Spend more time outdoors.  Brain stimulation. This treatment excites the brain. This is done when symptoms are very bad or have not gotten better with other treatments. Follow these instructions at home: Activity  Get regular exercise as told.  Spend time outdoors as told.  Make time to do the things you enjoy.  Find ways to deal with stress. Try to: ? Meditate. ? Do deep breathing. ? Spend time in nature. ? Keep a journal.  Return to your normal activities as told by your doctor.   Ask your doctor what activities are safe for you. Alcohol and drug use  If you drink alcohol: ? Limit how much you use to:  0-1 drink a day for women.  0-2 drinks a day for men. ? Be aware of how much alcohol is in your drink. In the [place] one drink equals one 12 oz bottle of beer (355 mL), one 5 oz glass of wine (148 mL), or one 1 oz glass of hard liquor (44 mL).  Talk to your doctor about: ? Alcohol use. Alcohol can affect some medicines. ? Any drug use. General instructions  Take over-the-counter and prescription medicines  and herbal preparations only as told by your doctor.  Eat a healthy diet.  Get a lot of sleep.  Think about joining a support group. Your doctor may be able to suggest one.  Keep all follow-up visits as told by your doctor. This is important.   Where to find more information:  [employer] on Mental Illness: www.nami.[place]: https://carter.com/  American Psychiatric Association: www.psychiatry.org/patients-families/ Contact a doctor if:  Your symptoms get worse.  You get new symptoms. Get help right away if:  You hurt yourself.  You have serious thoughts about hurting yourself or others.  You see, hear, taste, smell, or feel things that are not there. If you ever feel like you may hurt yourself or others, or have thoughts about taking your own life, get help right away. Go to your nearest emergency department or:  Call your local emergency services (911 in the [place]  Call a suicide crisis helpline, such as the [place] at 517-265-3027. This is open 24 hours a day in the [place]  Text the Crisis Text Line at (832)829-6621 (in the [place] Summary  Major depressive disorder is a mental health condition. This disorder affects feelings. Symptoms of this condition last most of the day, almost every day, for 2 weeks.  The symptoms of this disorder can cause problems with relationships and with daily activities.  There are treatments and support for people who get this disorder. You may need more than one type of treatment.  Get help right away if you have serious thoughts about hurting yourself or others. This information is not intended to replace advice given to you by your health care provider. Make sure you discuss any questions you have with your health care provider. Document Revised: 02/13/2019 Document Reviewed: 02/13/2019 Elsevier Patient Education  2021 [employer].  Familial  Hypercholesterolemia Familial hypercholesterolemia ([place]) is a genetic disorder that causes a very high level of LDL (low-density lipoprotein) cholesterol. Cholesterol is a waxy, fat-like substance that your body needs to build cells. Your body makes all the cholesterol it needs in the liver and removes extra (excess) cholesterol from the blood as needed. Excess cholesterol comes from food that you eat. In people who have FH, the body is not able to remove LDL cholesterol from the blood as it should. A high level of LDL cholesterol puts you at higher risk for narrowing and hardening of your arteries (atherosclerosis) at an early age. This raises your risk for heart disease and stroke. What are the causes? FH is passed from parent to child (inherited). FH is caused by an inherited gene defect (genetic mutation) that makes it hard for the liver to remove LDL cholesterol from the blood. The gene may be inherited from one parent or both parents. What increases the risk? You may be at higher  risk for FH if:  You have a family history of the condition. If both parents carry the genetic mutation, their children are at higher risk for a more severe form of FH, with symptoms that start at an earlier age. What are the signs or symptoms? You may have a high level of LDL cholesterol before you develop symptoms. Symptoms of FH may include:  Cholesterol nodules (xanthomas) on the cords of tissue that connect muscles to bones (tendons). Xanthomas often form on the long tendon at the back of the ankle (Achilles tendon) or on the tendons on the back of the hands.  Cholesterol deposits (xanthelasmas) under the skin of the eyelids.  A gray or blue ring around the white part of the eye (corneal arcus). Complications of FH can occur due to atherosclerosis. Atherosclerosis may cause damage to an area of the body that is not getting enough blood. Complications of FH may include:  Chest pain (angina) and shortness of  breath due to narrowed or blocked arteries in the heart (coronary artery disease).  Pain and cramping in the back of the lower legs (calves) when walking (claudication).  Interruption in blood flow to the brain (stroke). This may cause: ? Loss of balance. ? Vision loss. ? Sudden weakness or numbness on one side of the body. How is this diagnosed? This condition may be diagnosed based on:  Your symptoms.  Your medical history, including any family history of FH or early coronary heart disease.  A physical exam.  A blood test to check for the genetic mutation that causes FH. Your family members may also be tested. How is this treated? There is no cure for FH, but treatment can lower LDL cholesterol levels and lower your risk for heart attack or stroke. Treatment should be started as soon as you are diagnosed. Treatment may include:  A type of medicine that lowers your cholesterol (statin). If statins do not help, your health care provider may try other kinds of cholesterol-lowering medicines. The exact combination of medicines depends on the severity of your symptoms.  A procedure to filter LDL from your blood (apheresis). You may need this treatment if you have a severe form of FH.  Making lifestyle changes that are healthy for your heart, such as lowering the amount of fat and cholesterol in your diet. Follow these instructions at home: Lifestyle  Lose weight, if directed by your health care provider.  Follow instructions from your health care provider about eating a healthy diet. Your health care provider may recommend: ? Working with a diet and nutrition specialist (dietitian), who can help you make a healthy eating plan and help you maintain a healthy weight. ? Eating less fat and cholesterol. Avoid fatty meats, fried foods, and whole-fat dairy. ? Eating more vegetables, fruits, and whole grains. ? Limiting your intake of alcohol.  Be physically active. Ask your health care  provider what type of exercise is best for you.  Do not use any products that contain nicotine or tobacco, such as cigarettes and e-cigarettes. If you need help quitting, ask your health care provider.  Work with your health care provider to manage any other conditions you have, such as high blood pressure (hypertension) or diabetes. These conditions affect your heart.   General instructions  Take over-the-counter and prescription medicines only as told by your health care provider.  Keep all follow-up visits as told by your health care provider. This is important. Contact a health care provider if:  You  have pain or cramps in your calf when you walk. Get help right away if:  You have sudden, unexplained discomfort in your chest, arms, back, neck, jaw, or upper body.  You have trouble breathing.  You have a sudden, severe headache with no known cause.  You have any symptoms of a stroke. "BE FAST" is an easy way to remember the main warning signs of a stroke: ? B - Balance. Signs are dizziness, sudden trouble walking, or loss of balance. ? E - Eyes. Signs are trouble seeing or a sudden change in vision. ? F - Face. Signs are sudden weakness or numbness of the face, or the face or eyelid drooping on one side. ? A - Arms. Signs are weakness or numbness in an arm. This happens suddenly and usually on one side of the body. ? S - Speech. Signs are sudden trouble speaking, slurred speech, or trouble understanding what people say. ? T - Time. Time to call emergency services. Write down what time symptoms started. These symptoms may represent a serious problem that is an emergency. Do not wait to see if the symptoms will go away. Get medical help right away. Call your local emergency services (911 in the [place] Do not drive yourself to the hospital.   Summary  Familial hypercholesterolemia (FH) is a genetic disorder that causes a very high level of LDL (low-density lipoprotein)  cholesterol.  FH increases your risk for coronary heart disease and stroke at an early age.  Treatment for FH should be started as soon as you are diagnosed with the condition. Treatment is aimed at lowering your risk for complications.  Follow instructions from your health care provider about eating a healthy diet. Your health care provider may recommend eating less fat and cholesterol. This information is not intended to replace advice given to you by your health care provider. Make sure you discuss any questions you have with your health care provider. Document Revised: 06/29/2019 Document Reviewed: 06/29/2019 Elsevier Patient Education  2021 [employer].

## 2020-07-22 LAB — HEPATIC FUNCTION PANEL
ALT: 63 IU/L — ABNORMAL HIGH (ref 0–32)
AST: 43 IU/L — ABNORMAL HIGH (ref 0–40)
Albumin: 4.9 g/dL (ref 3.8–4.9)
Alkaline Phosphatase: 53 IU/L (ref 44–121)
Bilirubin Total: 0.4 mg/dL (ref 0.0–1.2)
Bilirubin, Direct: 0.12 mg/dL (ref 0.00–0.40)
Total Protein: 7.2 g/dL (ref 6.0–8.5)

## 2020-07-22 LAB — LIPID PANEL
Chol/HDL Ratio: 2 ratio (ref 0.0–4.4)
Cholesterol, Total: 161 mg/dL (ref 100–199)
HDL: 82 mg/dL (ref >39)
LDL Chol Calc (NIH): 64 mg/dL (ref 0–99)
Triglycerides: 82 mg/dL (ref 0–149)
VLDL Cholesterol Cal: 15 mg/dL (ref 5–40)

## 2020-07-24 ENCOUNTER — Other Ambulatory Visit: Payer: Self-pay | Admitting: Cardiology

## 2020-07-24 ENCOUNTER — Other Ambulatory Visit (HOSPITAL_BASED_OUTPATIENT_CLINIC_OR_DEPARTMENT_OTHER): Payer: Self-pay

## 2020-07-24 MED ORDER — ROSUVASTATIN CALCIUM 10 MG PO TABS
10.0000 mg | ORAL_TABLET | Freq: Every day | ORAL | 3 refills | Status: DC
  Filled 2020-07-24 – 2020-08-02 (×2): qty 90, 90d supply, fill #0

## 2020-07-25 NOTE — Telephone Encounter (Signed)
Freada Bergeron, MD  [name] 18 hours ago (1:02 PM)   HP   Absolutely. I can decrease the crestor to 10mg  daily. I will send it into your pharmacy.

## 2020-07-28 ENCOUNTER — Other Ambulatory Visit (HOSPITAL_BASED_OUTPATIENT_CLINIC_OR_DEPARTMENT_OTHER): Payer: Self-pay

## 2020-08-01 ENCOUNTER — Other Ambulatory Visit (HOSPITAL_BASED_OUTPATIENT_CLINIC_OR_DEPARTMENT_OTHER): Payer: Self-pay

## 2020-08-01 MED FILL — Levothyroxine Sodium Tab 50 MCG: ORAL | 90 days supply | Qty: 90 | Fill #0 | Status: AC

## 2020-08-02 ENCOUNTER — Other Ambulatory Visit: Payer: Self-pay | Admitting: Obstetrics & Gynecology

## 2020-08-02 ENCOUNTER — Other Ambulatory Visit (HOSPITAL_BASED_OUTPATIENT_CLINIC_OR_DEPARTMENT_OTHER): Payer: Self-pay

## 2020-08-02 DIAGNOSIS — Z1231 Encounter for screening mammogram for malignant neoplasm of breast: Secondary | ICD-10-CM

## 2020-08-17 ENCOUNTER — Ambulatory Visit: Payer: 59 | Admitting: Internal Medicine

## 2020-08-28 ENCOUNTER — Other Ambulatory Visit: Payer: Self-pay | Admitting: Obstetrics & Gynecology

## 2020-08-28 MED ORDER — ZOLPIDEM TARTRATE 5 MG PO TABS
ORAL_TABLET | ORAL | 2 refills | Status: DC
  Filled 2020-08-28: qty 30, 30d supply, fill #0
  Filled 2020-10-16: qty 30, 30d supply, fill #1
  Filled 2020-11-28: qty 30, 30d supply, fill #2

## 2020-08-29 ENCOUNTER — Other Ambulatory Visit: Payer: Self-pay

## 2020-08-29 ENCOUNTER — Ambulatory Visit: Payer: 59 | Admitting: Internal Medicine

## 2020-08-29 ENCOUNTER — Other Ambulatory Visit (HOSPITAL_BASED_OUTPATIENT_CLINIC_OR_DEPARTMENT_OTHER): Payer: Self-pay

## 2020-08-29 ENCOUNTER — Encounter: Payer: Self-pay | Admitting: Internal Medicine

## 2020-08-29 VITALS — BP 134/80 | HR 64 | Ht 66.5 in | Wt 186.0 lb

## 2020-08-29 DIAGNOSIS — E063 Autoimmune thyroiditis: Secondary | ICD-10-CM

## 2020-08-29 LAB — TSH: TSH: 3.95 u[IU]/mL (ref 0.35–4.50)

## 2020-08-29 MED ORDER — LEVOTHYROXINE SODIUM 75 MCG PO TABS
75.0000 ug | ORAL_TABLET | Freq: Every day | ORAL | 3 refills | Status: DC
  Filled 2020-08-29: qty 90, 90d supply, fill #0
  Filled 2020-11-28: qty 90, 90d supply, fill #1

## 2020-08-29 NOTE — Progress Notes (Signed)
Name: Angela Wells  MRN/ DOB: [id number], 1970/12/07    Age/ Sex: 50 y.o., female     PCP: Ronnell Freshwater, NP   Reason for Endocrinology Evaluation: Subclinical hypothyroidism     Initial Endocrinology Clinic Visit: 10/08/2019    PATIENT IDENTIFIER: Angela Wells is a 50 y.o., female with a past medical history of Dyslipidemia. She has followed with [facility] since 10/08/2018 for consultative assistance with management of her subclinical hypothyroisism.   HISTORICAL SUMMARY:  Pt noted to have an elevated TSH at 8.490 uIU/mL with normal FT4 during routine labs in 07/2018. Repeat labs a month later confirmed similar results.   Pt was started on LT-4 replacement at the time   Mother with Hashimoto's   SUBJECTIVE:    Today (08/29/2020):  Angela Wells is here for a follow up on hypothyroidism.   She has been compliant with levothyroxine but has been taking it at bedtime.    Denies constipation or diarrhea  Has chronic depression which has been improving on Prozac  No local neck symptoms    Works with oncology with Dr. Humberto Seals     Levothyroxine 50 mcg daily   HISTORY:  Past Medical History:  Past Medical History:  Diagnosis Date   Abnormal Pap smear    Anxiety    Arthritis    hands   Depression    Dyslipidemia with elevated low density lipoprotein (LDL) cholesterol and abnormally low high density lipoprotein cholesterol    diet controlled   Eczema    High cholesterol    Hypertension    resolved - lost weight, no med   Hyperthyroidism    SVD (spontaneous vaginal delivery)    x 2   Past Surgical History:  Past Surgical History:  Procedure Laterality Date   DILATATION & CURETTAGE/HYSTEROSCOPY WITH MYOSURE N/A 06/16/2015   Procedure: DILATATION & CURETTAGE/HYSTEROSCOPY WITH Novasure;  Surgeon: [name], MD;  Location: [facility];  Service: Gynecology;  Laterality: N/A;   GYNECOLOGIC CRYOSURGERY     LAPAROSCOPIC TUBAL LIGATION   03/04/2011   Procedure: LAPAROSCOPIC TUBAL LIGATION;  Surgeon: Felipa Emory;  Location: [facility];  Service: Gynecology;  Laterality: Bilateral;  with filshie clips   TUBAL LIGATION  03/04/2011   Procedure: ESSURE TUBAL STERILIZATION;  Surgeon: Felipa Emory;  Location: [facility];  Service: Gynecology;  Laterality: Bilateral;  Attempted   WISDOM TOOTH EXTRACTION     Social History:  reports that she quit smoking about 22 years ago. Her smoking use included cigarettes. She has a 3.25 pack-year smoking history. She has never used smokeless tobacco. She reports current alcohol use. She reports that she does not use drugs. Family History:  Family History  Problem Relation Age of Onset   Hypertension Father    Hyperlipidemia Father    Colon polyps Father    Thyroid disease Mother    Hypertension Sister    Hyperlipidemia Sister    Healthy Sister    Healthy Daughter    Healthy Son    Colon cancer Neg Hx    Esophageal cancer Neg Hx    Rectal cancer Neg Hx    Stomach cancer Neg Hx      HOME MEDICATIONS: Allergies as of 08/29/2020   No Known Allergies      Medication List        Accurate as of August 29, 2020  8:09 AM. If you have any questions, ask your nurse or doctor.  acetaminophen 325 MG tablet Commonly known as: TYLENOL Take 650 mg by mouth every 6 (six) hours as needed for mild pain or headache.   BL EVENING PRIMROSE OIL PO Take 1 capsule by mouth 2 (two) times daily. Reported on 06/05/2015   BLACK COHOSH PO Take 1 tablet by mouth 2 (two) times daily. Reported on 06/05/2015   Catalytic Formula Tabs Take 1 tablet by mouth daily. Reported on 06/05/2015   DHA-EPA-Vitamin E 527-782-42 MG-MG-UNIT Caps Take 2 capsules by mouth 2 (two) times daily. Reported on 06/05/2015   FLUoxetine 20 MG capsule Commonly known as: PROZAC Take 1 capsule (20 mg total) by mouth daily.   gabapentin 100 MG capsule Commonly known as: NEURONTIN Take 100 mg by mouth at bedtime.    ibuprofen 400 MG tablet Commonly known as: ADVIL Take 400 mg by mouth every 6 (six) hours as needed.   levothyroxine 50 MCG tablet Commonly known as: SYNTHROID TAKE 1 TABLET (50 MCG TOTAL) BY MOUTH DAILY.   multivitamins ther. w/minerals Tabs tablet Take 1 tablet by mouth daily. Reported on 06/05/2015   rosuvastatin 10 MG tablet Commonly known as: CRESTOR Take 1 tablet (10 mg total) by mouth daily.   Vitamin B 12 100 MCG Lozg Take by mouth.   zolpidem 5 MG tablet Commonly known as: AMBIEN TAKE 1/2 TO 1 TABLET BY MOUTH EVERY NIGHT AT BEDTIME AS NEEDED FOR INSOMNIA          OBJECTIVE:   PHYSICAL EXAM: VS: Ht 5' 6.5" (1.689 m)   Wt 186 lb (84.4 kg)   BMI 29.57 kg/m    EXAM: General: Pt appears well and is in NAD  Neck: General: Supple without adenopathy. Thyroid: Thyroid size normal.  No goiter or nodules appreciated. No thyroid bruit.  Lungs: Clear with good BS bilat with no rales, rhonchi, or wheezes  Heart: Auscultation: RRR.  Abdomen: Normoactive bowel sounds, soft, nontender, without masses or organomegaly palpable  Extremities:  BL LE: No pretibial edema normal ROM and strength.  Mental Status: Judgment, insight: Intact Orientation: Oriented to time, place, and person Mood and affect: No depression, anxiety, or agitation     DATA REVIEWED: Results for Angela Wells, Angela Wells (MRN [id number]) as of 08/29/2020 13:53  Ref. Range 08/29/2020 08:27  TSH Latest Ref Range: 0.35 - 4.50 uIU/mL 3.95    ASSESSMENT / PLAN / RECOMMENDATIONS:   Hypothyroidism:   - Pt is clinically euthyroid - No local neck symptoms  - Repeat TSH at the upper limit of normal, will increase dose as below  - No changes today    Medications   Stop Levothyroxine 50 mcg  Start Levothyroxine 75 mcg daily   F/U in 1 yr  Labs in 8 weeks   Signed electronically by: Mack Guise, MD  [facility]  [address] [place], [place]  [address] Phone: [phone] FAX: [phone]      CC: Ronnell Freshwater, NP [place] [address] Phone: [phone]  Fax: [phone]   Return to Endocrinology clinic as below: Future Appointments  Date Time Provider [place]  10/12/2020  7:30 AM [facility] MM 2 [facility]  11/03/2020  9:20 AM Ronnell Freshwater, NP [facility] None  02/01/2021  2:15 PM [name], MD [facility]

## 2020-10-12 ENCOUNTER — Other Ambulatory Visit: Payer: Self-pay

## 2020-10-12 ENCOUNTER — Ambulatory Visit
Admission: RE | Admit: 2020-10-12 | Discharge: 2020-10-12 | Disposition: A | Discharge status: Home / Self Care | Payer: 59 | Source: Ambulatory Visit

## 2020-10-12 DIAGNOSIS — Z1231 Encounter for screening mammogram for malignant neoplasm of breast: Secondary | ICD-10-CM

## 2020-10-16 ENCOUNTER — Other Ambulatory Visit (HOSPITAL_BASED_OUTPATIENT_CLINIC_OR_DEPARTMENT_OTHER): Payer: Self-pay

## 2020-11-03 ENCOUNTER — Ambulatory Visit: Payer: 59 | Admitting: Nurse Practitioner

## 2020-11-28 ENCOUNTER — Other Ambulatory Visit (HOSPITAL_BASED_OUTPATIENT_CLINIC_OR_DEPARTMENT_OTHER): Payer: Self-pay

## 2021-02-01 ENCOUNTER — Other Ambulatory Visit (HOSPITAL_BASED_OUTPATIENT_CLINIC_OR_DEPARTMENT_OTHER): Payer: Self-pay

## 2021-02-01 ENCOUNTER — Ambulatory Visit (INDEPENDENT_AMBULATORY_CARE_PROVIDER_SITE_OTHER): Payer: 59 | Admitting: Obstetrics & Gynecology

## 2021-02-01 ENCOUNTER — Other Ambulatory Visit: Payer: Self-pay

## 2021-02-01 ENCOUNTER — Encounter (HOSPITAL_BASED_OUTPATIENT_CLINIC_OR_DEPARTMENT_OTHER): Payer: Self-pay | Admitting: Obstetrics & Gynecology

## 2021-02-01 VITALS — BP 126/79 | HR 62 | Ht 67.0 in | Wt 188.2 lb

## 2021-02-01 DIAGNOSIS — F5104 Psychophysiologic insomnia: Secondary | ICD-10-CM

## 2021-02-01 DIAGNOSIS — E785 Hyperlipidemia, unspecified: Secondary | ICD-10-CM

## 2021-02-01 DIAGNOSIS — R29898 Other symptoms and signs involving the musculoskeletal system: Secondary | ICD-10-CM

## 2021-02-01 DIAGNOSIS — Z01419 Encounter for gynecological examination (general) (routine) without abnormal findings: Secondary | ICD-10-CM | POA: Diagnosis not present

## 2021-02-01 DIAGNOSIS — Z780 Asymptomatic menopausal state: Secondary | ICD-10-CM

## 2021-02-01 DIAGNOSIS — M25541 Pain in joints of right hand: Secondary | ICD-10-CM | POA: Diagnosis not present

## 2021-02-01 DIAGNOSIS — Z0000 Encounter for general adult medical examination without abnormal findings: Secondary | ICD-10-CM

## 2021-02-01 DIAGNOSIS — M25542 Pain in joints of left hand: Secondary | ICD-10-CM

## 2021-02-01 DIAGNOSIS — N951 Menopausal and female climacteric states: Secondary | ICD-10-CM

## 2021-02-01 MED ORDER — ZOLPIDEM TARTRATE 5 MG PO TABS
ORAL_TABLET | ORAL | 2 refills | Status: DC
  Filled 2021-02-01: qty 30, 30d supply, fill #0
  Filled 2021-04-19: qty 30, 30d supply, fill #1
  Filled 2021-06-15: qty 30, 30d supply, fill #2

## 2021-02-01 NOTE — Progress Notes (Signed)
50 y.o. [id number] Married White or Caucasian female here for annual exam.  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.    Denies vaginal bleeding.    Still having hot flashes.  Under decent control.  Uses ambien nightly.  She take 1/2 tab nightly with valerian root.  Feeling a lot of fatigue.  Has also felt like she has lost a lot of strength in her hands.  Does have aching and stabbing pains in her thumbs.  Does not have raynaud's symptoms.  Uses voltaren gel 4 times daily and this does help.  No LMP recorded. Patient has had an ablation.          Sexually active: Yes.    The current method of family planning is post menopausal status.    Exercising: No.   Smoker:  no  Health Maintenance: Pap:  01/17/2020 Negative, neg HR HPV History of abnormal Pap:  h/o cryo MMG:  10/12/2020 Negative Colonoscopy:  03/04/2019, follow up 10 year BMD:   not indicated Screening Labs: ordered   reports that she quit smoking about 22 years ago. Her smoking use included cigarettes. She has a 3.25 pack-year smoking history. She has never used smokeless tobacco. She reports current alcohol use. She reports that she does not use drugs.  Past Medical History:  Diagnosis Date   Abnormal Pap smear    Anxiety    Arthritis    hands   Depression    Dyslipidemia with elevated low density lipoprotein (LDL) cholesterol and abnormally low high density lipoprotein cholesterol    diet controlled   Eczema    High cholesterol    Hypertension    resolved - lost weight, no med   Hyperthyroidism    SVD (spontaneous vaginal delivery)    x 2    Past Surgical History:  Procedure Laterality Date   DILATATION & CURETTAGE/HYSTEROSCOPY WITH MYOSURE N/A 06/16/2015   Procedure: DILATATION & CURETTAGE/HYSTEROSCOPY WITH Novasure;  Surgeon: [name], MD;  Location: [facility];  Service: Gynecology;  Laterality: N/A;   GYNECOLOGIC CRYOSURGERY     LAPAROSCOPIC TUBAL LIGATION  03/04/2011   Procedure:  LAPAROSCOPIC TUBAL LIGATION;  Surgeon: Felipa Emory;  Location: [facility];  Service: Gynecology;  Laterality: Bilateral;  with filshie clips   TUBAL LIGATION  03/04/2011   Procedure: ESSURE TUBAL STERILIZATION;  Surgeon: Felipa Emory;  Location: [facility];  Service: Gynecology;  Laterality: Bilateral;  Attempted   WISDOM TOOTH EXTRACTION      Current Outpatient Medications  Medication Sig Dispense Refill   acetaminophen (TYLENOL) 325 MG tablet Take 650 mg by mouth every 6 (six) hours as needed for mild pain or headache.     BL EVENING PRIMROSE OIL PO Take 1 capsule by mouth 2 (two) times daily. Reported on 06/05/2015     Cyanocobalamin (VITAMIN B 12) 100 MCG LOZG Take by mouth.     DHA-EPA-Vitamin E 192-251-11 MG-MG-UNIT CAPS Take 2 capsules by mouth 2 (two) times daily. Reported on 06/05/2015     gabapentin (NEURONTIN) 100 MG capsule Take 100 mg by mouth at bedtime.     ibuprofen (ADVIL) 400 MG tablet Take 400 mg by mouth every 6 (six) hours as needed.     levothyroxine (SYNTHROID) 75 MCG tablet Take 1 tablet (75 mcg total) by mouth daily. 90 tablet 3   Multiple Vitamins-Minerals (MULTIVITAMINS THER. W/MINERALS) TABS Take 1 tablet by mouth daily. Reported on 06/05/2015     Nutritional Supplements (CATALYTIC FORMULA)  TABS Take 1 tablet by mouth daily. Reported on 06/05/2015     zolpidem (AMBIEN) 5 MG tablet TAKE 1/2 TO 1 TABLET BY MOUTH EVERY NIGHT AT BEDTIME AS NEEDED FOR INSOMNIA 30 tablet 2   BLACK COHOSH PO Take 1 tablet by mouth 2 (two) times daily. Reported on 06/05/2015     FLUoxetine (PROZAC) 20 MG capsule Take 1 capsule (20 mg total) by mouth daily. 90 capsule 3   rosuvastatin (CRESTOR) 10 MG tablet Take 1 tablet (10 mg total) by mouth daily. 90 tablet 3   No current facility-administered medications for this visit.    Family History  Problem Relation Age of Onset   Hypertension Father    Hyperlipidemia Father    Colon polyps Father    Thyroid disease Mother    Hypertension  Sister    Hyperlipidemia Sister    Healthy Sister    Healthy Daughter    Healthy Son    Colon cancer Neg Hx    Esophageal cancer Neg Hx    Rectal cancer Neg Hx    Stomach cancer Neg Hx     Review of Systems  Constitutional:  Positive for fatigue.  All other systems reviewed and are negative.  Exam:   BP 126/79 (BP Location: Right Arm, Patient Position: Sitting, Cuff Size: Large)   Pulse 62   Ht 5\' 7"  (1.702 m) Comment: reported  Wt 188 lb 3.2 oz (85.4 kg)   BMI 29.48 kg/m   Height: 5\' 7"  (170.2 cm) (reported)  General appearance: alert, cooperative and appears stated age Head: Normocephalic, without obvious abnormality, atraumatic Neck: no adenopathy, supple, symmetrical, trachea midline and thyroid normal to inspection and palpation Lungs: clear to auscultation bilaterally Breasts: normal appearance, no masses or tenderness Heart: regular rate and rhythm Abdomen: soft, non-tender; bowel sounds normal; no masses,  no organomegaly Extremities: extremities normal, atraumatic, no cyanosis or edema Skin: Skin color, texture, turgor normal. No rashes or lesions Lymph nodes: Cervical, supraclavicular, and axillary nodes normal. No abnormal inguinal nodes palpated Neurologic: Grossly normal   Pelvic: External genitalia:  no lesions              Urethra:  normal appearing urethra with no masses, tenderness or lesions              Bartholins and Skenes: normal                 Vagina: normal appearing vagina with normal color and no discharge, no lesions              Cervix: no lesions              Pap taken: No. Bimanual Exam:  Uterus:  normal size, contour, position, consistency, mobility, non-tender              Adnexa: normal adnexa and no mass, fullness, tenderness               Rectovaginal: Confirms               Anus:  normal sphincter tone, no lesions  Chaperone, Octaviano Batty, CMA, was present for exam.  Assessment/Plan: 1. Well woman exam with routine gynecological  exam - pap neg 2021 with neg HR HPV - MMG 2022 - colonoscopy 2020, follow up 10 years - declines vaccinations  2. Postmenopausal - no HRT  3. Vasomotor symptoms due to menopause  4. Elevated lipids - followed by cardiology  5. Chronic insomnia - rx for [employer]  5mg  1/2 tab nightly qhs prn.  #30/2RF  6. Arthralgia of both hands  7. Decreased grip strength of both hands - ANA, RF, ANA, CBC with diff, CMP ordered today    .

## 2021-02-02 LAB — CBC WITH DIFFERENTIAL/PLATELET
Basophils Absolute: 0.1 10*3/uL (ref 0.0–0.2)
Basos: 1 %
EOS (ABSOLUTE): 0.2 10*3/uL (ref 0.0–0.4)
Eos: 3 %
Hematocrit: 43.5 % (ref 34.0–46.6)
Hemoglobin: 14.3 g/dL (ref 11.1–15.9)
Immature Grans (Abs): 0 10*3/uL (ref 0.0–0.1)
Immature Granulocytes: 0 %
Lymphocytes Absolute: 2.7 10*3/uL (ref 0.7–3.1)
Lymphs: 33 %
MCH: 29.8 pg (ref 26.6–33.0)
MCHC: 32.9 g/dL (ref 31.5–35.7)
MCV: 91 fL (ref 79–97)
Monocytes Absolute: 0.6 10*3/uL (ref 0.1–0.9)
Monocytes: 7 %
Neutrophils Absolute: 4.6 10*3/uL (ref 1.4–7.0)
Neutrophils: 56 %
Platelets: 309 10*3/uL (ref 150–450)
RBC: 4.8 x10E6/uL (ref 3.77–5.28)
RDW: 12.2 % (ref 11.7–15.4)
WBC: 8.3 10*3/uL (ref 3.4–10.8)

## 2021-02-02 LAB — COMPREHENSIVE METABOLIC PANEL
ALT: 29 IU/L (ref 0–32)
AST: 31 IU/L (ref 0–40)
Albumin/Globulin Ratio: 2 (ref 1.2–2.2)
Albumin: 5 g/dL — ABNORMAL HIGH (ref 3.8–4.9)
Alkaline Phosphatase: 58 IU/L (ref 44–121)
BUN/Creatinine Ratio: 15 (ref 9–23)
BUN: 13 mg/dL (ref 6–24)
Bilirubin Total: 0.3 mg/dL (ref 0.0–1.2)
CO2: 26 mmol/L (ref 20–29)
Calcium: 9.8 mg/dL (ref 8.7–10.2)
Chloride: 100 mmol/L (ref 96–106)
Creatinine, Ser: 0.85 mg/dL (ref 0.57–1.00)
Globulin, Total: 2.5 g/dL (ref 1.5–4.5)
Glucose: 92 mg/dL (ref 70–99)
Potassium: 4.3 mmol/L (ref 3.5–5.2)
Sodium: 143 mmol/L (ref 134–144)
Total Protein: 7.5 g/dL (ref 6.0–8.5)
eGFR: 82 mL/min/{1.73_m2} (ref >59)

## 2021-02-02 LAB — ANA: Anti Nuclear Antibody (ANA): NEGATIVE

## 2021-02-02 LAB — SEDIMENTATION RATE: Sed Rate: 7 mm/hr (ref 0–40)

## 2021-02-02 LAB — RHEUMATOID FACTOR: Rheumatoid fact SerPl-aCnc: <10 IU/mL (ref <14.0)

## 2021-03-13 DIAGNOSIS — H5213 Myopia, bilateral: Secondary | ICD-10-CM | POA: Diagnosis not present

## 2021-04-19 ENCOUNTER — Other Ambulatory Visit (HOSPITAL_BASED_OUTPATIENT_CLINIC_OR_DEPARTMENT_OTHER): Payer: Self-pay

## 2021-04-29 ENCOUNTER — Encounter: Payer: Self-pay | Admitting: Internal Medicine

## 2021-04-29 IMAGING — MG DIGITAL SCREENING BILATERAL MAMMOGRAM WITH TOMO AND CAD
8 series · 9 of 24 positions shown · non-contrast
Comparison: Previous exam(s).

CLINICAL DATA: Screening.

EXAM:
DIGITAL SCREENING BILATERAL MAMMOGRAM WITH TOMO AND CAD

[L MLO synth-2D]
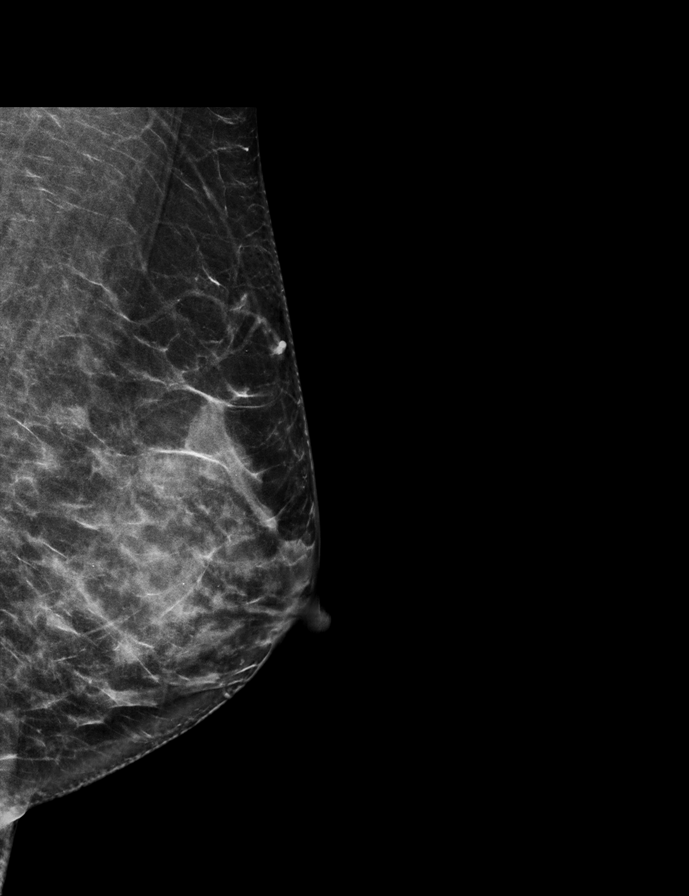

[R CC synth-2D]
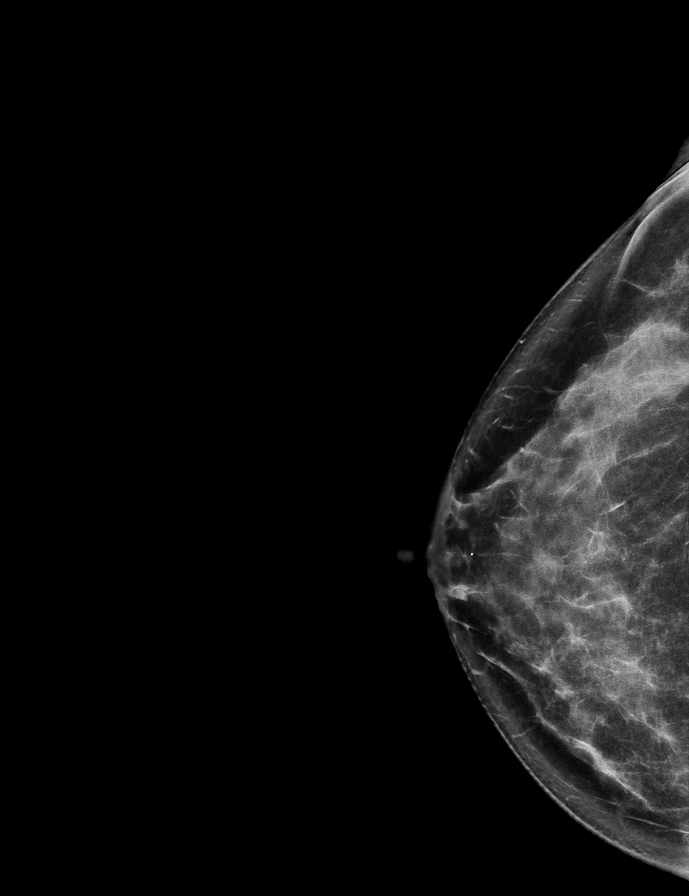

[L CC synth-2D]
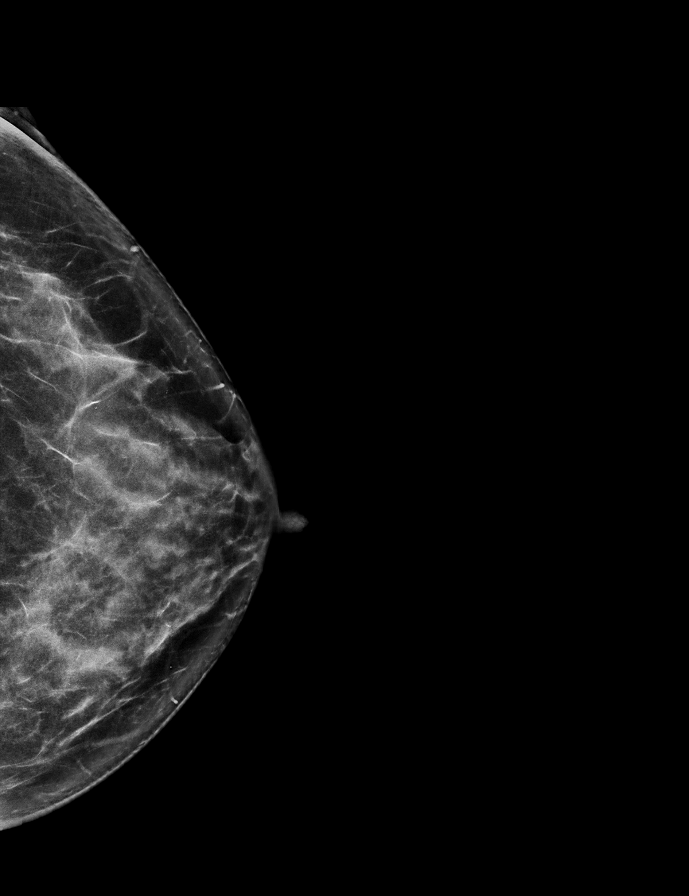

[R MLO synth-2D]
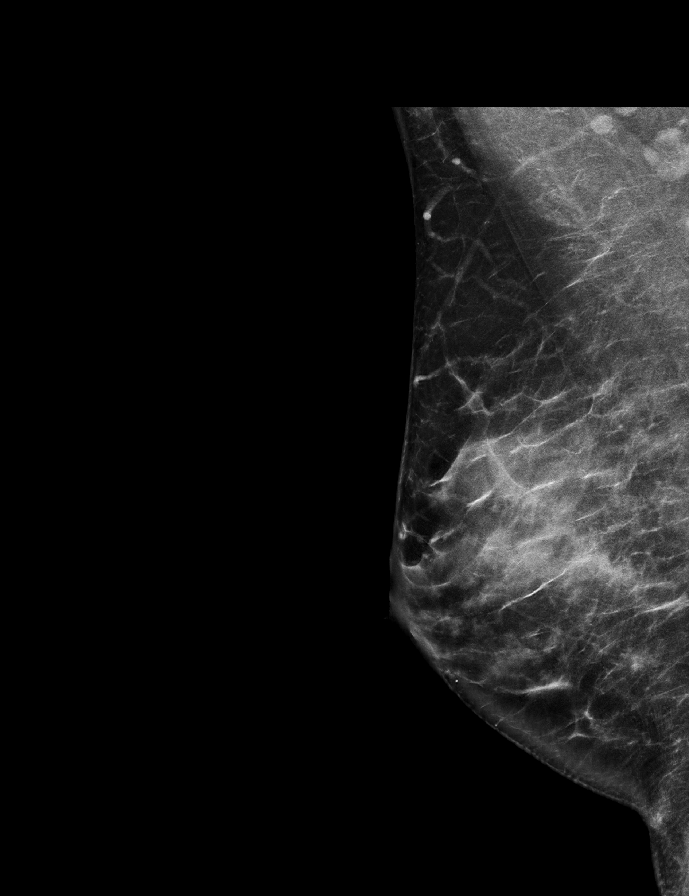

[L CC tomo · 2 of 66 frames shown]
[frame 22/66]
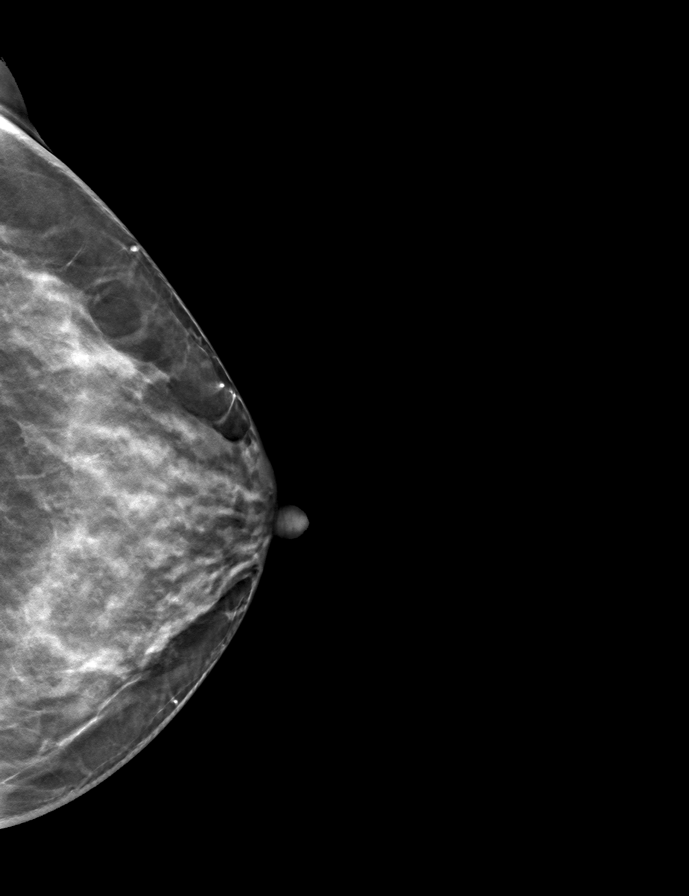
[frame 33/66]
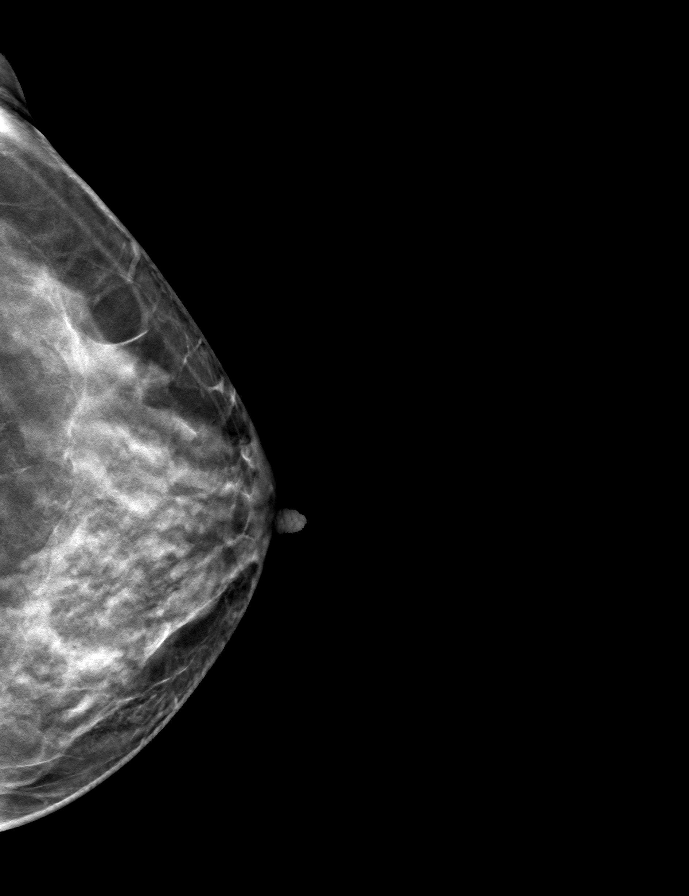

[L MLO tomo · tomo slice 33/66.0]
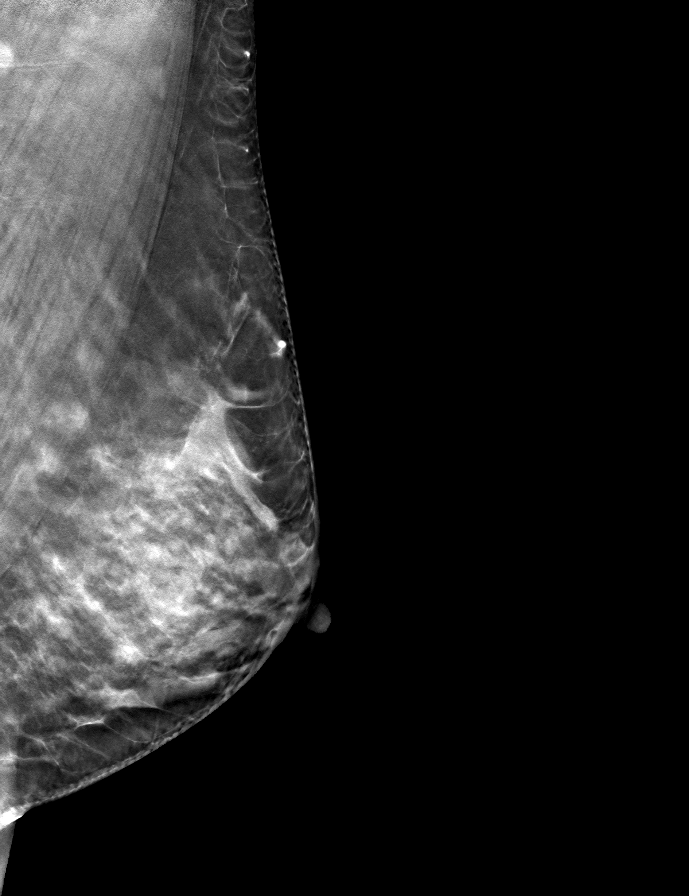

[R CC tomo · tomo slice 37/72.0]
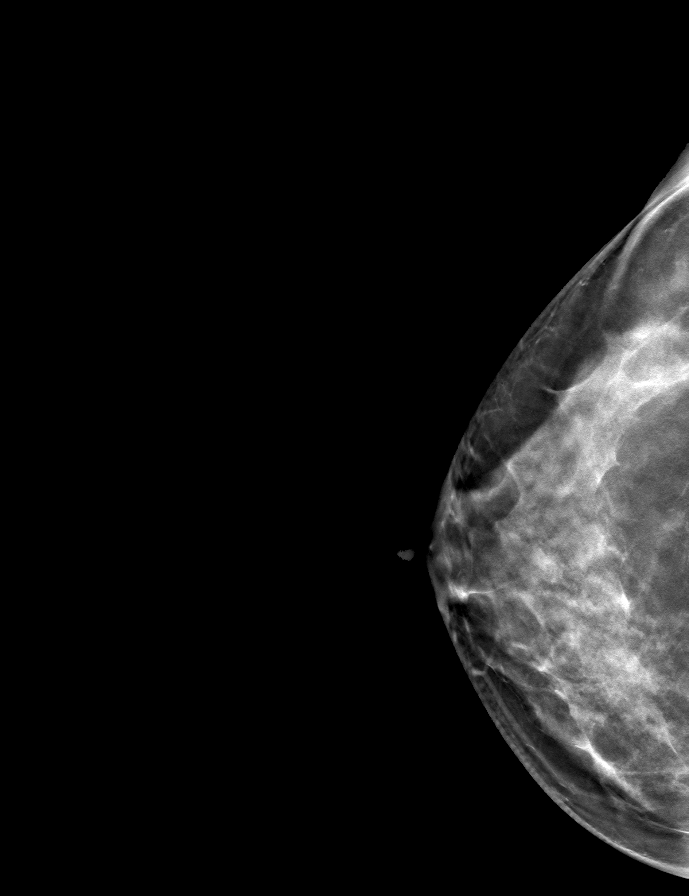

[R MLO tomo · tomo slice 37/72.0]
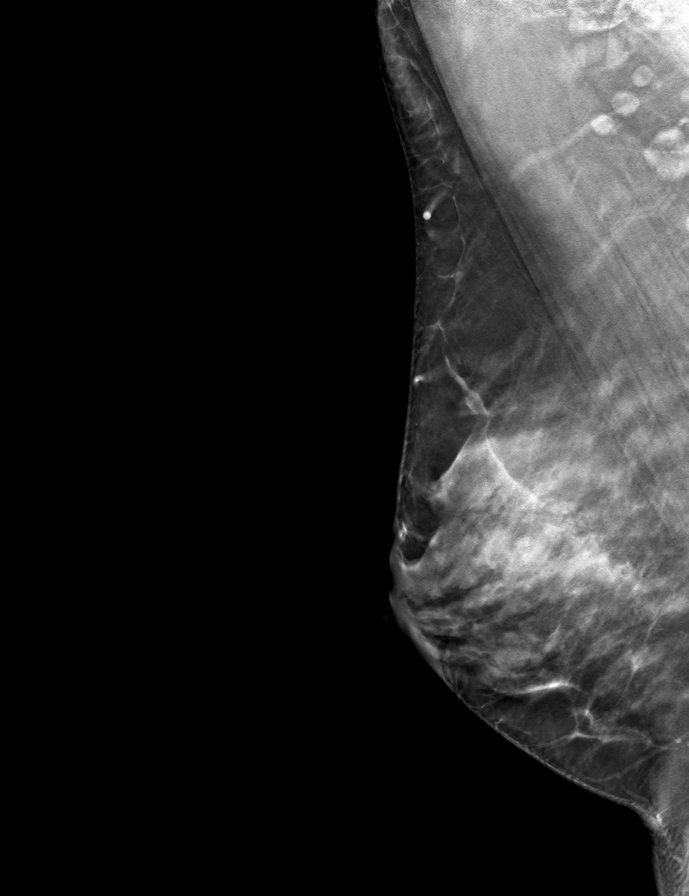

[9 of 24 positions shown; findings below may reference images not displayed]

ACR Breast Density Category c: The breast tissue is heterogeneously
dense, which may obscure small masses.
FINDINGS: There are no findings suspicious for malignancy. Images were
processed with CAD.
IMPRESSION: No mammographic evidence of malignancy. A result letter of this
screening mammogram will be mailed directly to the patient.

RECOMMENDATION:
Screening mammogram in one year. (Code:[id number]-[id number])

BI-RADS CATEGORY  1: Negative.

## 2021-04-30 ENCOUNTER — Other Ambulatory Visit: Payer: Self-pay | Admitting: Internal Medicine

## 2021-04-30 DIAGNOSIS — E038 Other specified hypothyroidism: Secondary | ICD-10-CM

## 2021-05-02 ENCOUNTER — Other Ambulatory Visit (INDEPENDENT_AMBULATORY_CARE_PROVIDER_SITE_OTHER): Payer: 59

## 2021-05-02 ENCOUNTER — Other Ambulatory Visit: Payer: Self-pay

## 2021-05-02 DIAGNOSIS — E038 Other specified hypothyroidism: Secondary | ICD-10-CM

## 2021-05-02 LAB — T4, FREE: Free T4: 0.81 ng/dL (ref 0.60–1.60)

## 2021-05-02 LAB — TSH: TSH: 10.23 u[IU]/mL — ABNORMAL HIGH (ref 0.35–5.50)

## 2021-06-15 ENCOUNTER — Other Ambulatory Visit (HOSPITAL_BASED_OUTPATIENT_CLINIC_OR_DEPARTMENT_OTHER): Payer: Self-pay

## 2021-07-27 ENCOUNTER — Other Ambulatory Visit (HOSPITAL_BASED_OUTPATIENT_CLINIC_OR_DEPARTMENT_OTHER): Payer: Self-pay | Admitting: Obstetrics & Gynecology

## 2021-07-27 ENCOUNTER — Other Ambulatory Visit (HOSPITAL_BASED_OUTPATIENT_CLINIC_OR_DEPARTMENT_OTHER): Payer: Self-pay

## 2021-07-27 MED ORDER — ZOLPIDEM TARTRATE 5 MG PO TABS
ORAL_TABLET | ORAL | 2 refills | Status: DC
  Filled 2021-07-27: qty 30, 30d supply, fill #0
  Filled 2021-09-28: qty 30, 30d supply, fill #1
  Filled 2021-11-15: qty 30, 30d supply, fill #2

## 2021-09-13 ENCOUNTER — Other Ambulatory Visit (HOSPITAL_BASED_OUTPATIENT_CLINIC_OR_DEPARTMENT_OTHER): Payer: Self-pay

## 2021-09-28 ENCOUNTER — Other Ambulatory Visit (HOSPITAL_BASED_OUTPATIENT_CLINIC_OR_DEPARTMENT_OTHER): Payer: Self-pay

## 2021-10-22 ENCOUNTER — Other Ambulatory Visit: Payer: Self-pay | Admitting: Obstetrics & Gynecology

## 2021-10-22 DIAGNOSIS — Z1231 Encounter for screening mammogram for malignant neoplasm of breast: Secondary | ICD-10-CM

## 2021-10-30 ENCOUNTER — Ambulatory Visit: Payer: 59

## 2021-11-13 ENCOUNTER — Ambulatory Visit
Admission: RE | Admit: 2021-11-13 | Discharge: 2021-11-13 | Disposition: A | Discharge status: Home / Self Care | Payer: 59 | Source: Ambulatory Visit | Attending: Obstetrics & Gynecology | Admitting: Obstetrics & Gynecology

## 2021-11-13 DIAGNOSIS — Z1231 Encounter for screening mammogram for malignant neoplasm of breast: Secondary | ICD-10-CM

## 2021-11-15 ENCOUNTER — Other Ambulatory Visit (HOSPITAL_BASED_OUTPATIENT_CLINIC_OR_DEPARTMENT_OTHER): Payer: Self-pay

## 2022-01-05 ENCOUNTER — Other Ambulatory Visit (HOSPITAL_BASED_OUTPATIENT_CLINIC_OR_DEPARTMENT_OTHER): Payer: Self-pay | Admitting: Obstetrics & Gynecology

## 2022-01-08 ENCOUNTER — Other Ambulatory Visit (HOSPITAL_BASED_OUTPATIENT_CLINIC_OR_DEPARTMENT_OTHER): Payer: Self-pay

## 2022-01-08 MED ORDER — ZOLPIDEM TARTRATE 5 MG PO TABS
ORAL_TABLET | ORAL | 1 refills | Status: DC
  Filled 2022-01-08: qty 30, 30d supply, fill #0
  Filled 2022-02-13: qty 30, 30d supply, fill #1

## 2022-02-13 ENCOUNTER — Other Ambulatory Visit (HOSPITAL_BASED_OUTPATIENT_CLINIC_OR_DEPARTMENT_OTHER): Payer: Self-pay

## 2022-02-14 ENCOUNTER — Encounter (HOSPITAL_BASED_OUTPATIENT_CLINIC_OR_DEPARTMENT_OTHER): Payer: Self-pay | Admitting: Obstetrics & Gynecology

## 2022-02-14 ENCOUNTER — Ambulatory Visit (INDEPENDENT_AMBULATORY_CARE_PROVIDER_SITE_OTHER): Payer: 59 | Admitting: Obstetrics & Gynecology

## 2022-02-14 ENCOUNTER — Other Ambulatory Visit (HOSPITAL_BASED_OUTPATIENT_CLINIC_OR_DEPARTMENT_OTHER): Payer: Self-pay

## 2022-02-14 VITALS — BP 135/84 | HR 59 | Ht 66.75 in | Wt 198.6 lb

## 2022-02-14 DIAGNOSIS — R4586 Emotional lability: Secondary | ICD-10-CM

## 2022-02-14 DIAGNOSIS — E039 Hypothyroidism, unspecified: Secondary | ICD-10-CM | POA: Diagnosis not present

## 2022-02-14 DIAGNOSIS — Z6831 Body mass index (BMI) 31.0-31.9, adult: Secondary | ICD-10-CM

## 2022-02-14 DIAGNOSIS — E669 Obesity, unspecified: Secondary | ICD-10-CM | POA: Diagnosis not present

## 2022-02-14 DIAGNOSIS — F5104 Psychophysiologic insomnia: Secondary | ICD-10-CM | POA: Diagnosis not present

## 2022-02-14 DIAGNOSIS — E66811 Obesity, class 1: Secondary | ICD-10-CM

## 2022-02-14 DIAGNOSIS — Z01419 Encounter for gynecological examination (general) (routine) without abnormal findings: Secondary | ICD-10-CM | POA: Diagnosis not present

## 2022-02-14 MED ORDER — BUPROPION HCL ER (SR) 150 MG PO TB12
150.0000 mg | ORAL_TABLET | Freq: Two times a day (BID) | ORAL | 2 refills | Status: DC
  Filled 2022-02-14: qty 30, 15d supply, fill #0

## 2022-02-14 NOTE — Progress Notes (Signed)
51 y.o. [id number] Married White or Caucasian female here for annual exam.  Doing well.  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.  Denies vaginal bleeding.  Hot flashes are much better.    Having lots of stressors with family.  Has no libido.  Overall feels mood is becoming an issues.  Headache medications causes headaches.  She's stopped thyroid medications because of this.  Has discussed with endocrinology and feels no additional options were discussed.    Discussed options for medications for mood today.  Will try wellbutrin SR.  She is willing to try this.    No LMP recorded. Patient has had an ablation.          Sexually active: Yes.    The current method of family planning is post menopausal status.    Smoker:  no  Health Maintenance: Pap:  01/17/2020 Negative History of abnormal Pap:  h/o cryo MMG:  11/13/2021 Negative Colonoscopy:  03/04/2019, f/u 5 years BMD:   not indicated Screening  Labs: will check thyroid levels today   reports that she quit smoking about 23 years ago. Her smoking use included cigarettes. She has a 3.25 pack-year smoking history. She has never used smokeless tobacco. She reports current alcohol use. She reports that she does not use drugs.  Past Medical History:  Diagnosis Date   Abnormal Pap smear    Anxiety    Arthritis    hands   Depression    Dyslipidemia with elevated low density lipoprotein (LDL) cholesterol and abnormally low high density lipoprotein cholesterol    diet controlled   Eczema    High cholesterol    Hypertension    resolved - lost weight, no med   Hyperthyroidism    SVD (spontaneous vaginal delivery)    x 2    Past Surgical History:  Procedure Laterality Date   DILATATION & CURETTAGE/HYSTEROSCOPY WITH MYOSURE N/A 06/16/2015   Procedure: DILATATION & CURETTAGE/HYSTEROSCOPY WITH Novasure;  Surgeon: [name], MD;  Location: [facility];  Service: Gynecology;  Laterality: N/A;   GYNECOLOGIC CRYOSURGERY      LAPAROSCOPIC TUBAL LIGATION  03/04/2011   Procedure: LAPAROSCOPIC TUBAL LIGATION;  Surgeon: Felipa Emory;  Location: [facility];  Service: Gynecology;  Laterality: Bilateral;  with filshie clips   TUBAL LIGATION  03/04/2011   Procedure: ESSURE TUBAL STERILIZATION;  Surgeon: Felipa Emory;  Location: [facility];  Service: Gynecology;  Laterality: Bilateral;  Attempted   WISDOM TOOTH EXTRACTION      Current Outpatient Medications  Medication Sig Dispense Refill   acetaminophen (TYLENOL) 325 MG tablet Take 650 mg by mouth every 6 (six) hours as needed for mild pain or headache.     BL EVENING PRIMROSE OIL PO Take 1 capsule by mouth 2 (two) times daily. Reported on 06/05/2015     buPROPion (WELLBUTRIN SR) 150 MG 12 hr tablet Take 1 tablet (150 mg total) by mouth 2 (two) times daily. 30 tablet 2   Cyanocobalamin (VITAMIN B 12) 100 MCG LOZG Take by mouth.     ibuprofen (ADVIL) 400 MG tablet Take 400 mg by mouth every 6 (six) hours as needed.     Multiple Vitamins-Minerals (MULTIVITAMINS THER. W/MINERALS) TABS Take 1 tablet by mouth daily. Reported on 06/05/2015     zolpidem (AMBIEN) 5 MG tablet TAKE 1/2 TO 1 TABLET BY MOUTH EVERY NIGHT AT BEDTIME AS NEEDED FOR INSOMNIA 30 tablet 1   No current facility-administered medications for this visit.  Family History  Problem Relation Age of Onset   Hypertension Father    Hyperlipidemia Father    Colon polyps Father    Thyroid disease Mother    Hypertension Sister    Hyperlipidemia Sister    Healthy Sister    Healthy Daughter    Healthy Son    Colon cancer Neg Hx    Esophageal cancer Neg Hx    Rectal cancer Neg Hx    Stomach cancer Neg Hx     ROS: Constitutional: negative Genitourinary:negative  Exam:   BP 135/84   Pulse (!) 59   Ht 5' 6.75" (1.695 m)   Wt 198 lb 9.6 oz (90.1 kg)   BMI 31.34 kg/m   Height: 5' 6.75" (169.5 cm)  General appearance: alert, cooperative and appears stated age Head: Normocephalic, without obvious  abnormality, atraumatic Neck: no adenopathy, supple, symmetrical, trachea midline and thyroid normal to inspection and palpation Lungs: clear to auscultation bilaterally Breasts: normal appearance, no masses or tenderness Heart: regular rate and rhythm Abdomen: soft, non-tender; bowel sounds normal; no masses,  no organomegaly Extremities: extremities normal, atraumatic, no cyanosis or edema Skin: Skin color, texture, turgor normal. No rashes or lesions Lymph nodes: Cervical, supraclavicular, and axillary nodes normal. No abnormal inguinal nodes palpated Neurologic: Grossly normal   Pelvic: External genitalia:  no lesions              Urethra:  normal appearing urethra with no masses, tenderness or lesions              Bartholins and Skenes: normal                 Vagina: normal appearing vagina with normal color and no discharge, no lesions              Cervix: no lesions              Pap taken: No. Bimanual Exam:  Uterus:  normal size, contour, position, consistency, mobility, non-tender              Adnexa: normal adnexa and no mass, fullness, tenderness               Rectovaginal: Confirms               Anus:  normal sphincter tone, no lesions  Chaperone, Octaviano Batty, CMA, was present for exam.  Assessment/Plan: 1. Well woman exam with routine gynecological exam - Pap smear 01/2020 neg with neg HR HPV.  Will repeat next year. - Mammogram 10/2021 - Colonoscopy 2020, follow up 5 years - Bone mineral density not indicated at this time - lab work discussed.  Will obtain thyroid testing today - vaccines declined  2. Mood changes - buPROPion (WELLBUTRIN SR) 150 MG 12 hr tablet; Take 1 tablet (150 mg total) by mouth 2 (two) times daily.  Dispense: 30 tablet; Refill: 2  3. Class 1 obesity without serious comorbidity with body mass index (BMI) of 31.0 to 31.9 in adult, unspecified obesity type  4. Acquired hypothyroidism - T4, free - TSH  5. Chronic insomnia - does not need  ambien refill

## 2022-02-14 NOTE — Patient Instructions (Signed)
[facility] Address: [address], [place], [place] [address] Phone: [phone]

## 2022-02-18 ENCOUNTER — Encounter (HOSPITAL_BASED_OUTPATIENT_CLINIC_OR_DEPARTMENT_OTHER): Payer: Self-pay | Admitting: Obstetrics & Gynecology

## 2022-02-19 DIAGNOSIS — E039 Hypothyroidism, unspecified: Secondary | ICD-10-CM | POA: Diagnosis not present

## 2022-02-20 ENCOUNTER — Other Ambulatory Visit (HOSPITAL_BASED_OUTPATIENT_CLINIC_OR_DEPARTMENT_OTHER): Payer: Self-pay

## 2022-02-20 LAB — T4, FREE: Free T4: 0.98 ng/dL (ref 0.82–1.77)

## 2022-02-20 LAB — TSH: TSH: 9.94 u[IU]/mL — ABNORMAL HIGH (ref 0.450–4.500)

## 2022-02-22 ENCOUNTER — Other Ambulatory Visit (HOSPITAL_BASED_OUTPATIENT_CLINIC_OR_DEPARTMENT_OTHER): Payer: Self-pay

## 2022-02-22 MED ORDER — SYNTHROID 75 MCG PO TABS
75.0000 ug | ORAL_TABLET | Freq: Every day | ORAL | 6 refills | Status: DC
  Filled 2022-02-22: qty 30, 30d supply, fill #0
  Filled 2022-03-24 – 2022-03-25 (×2): qty 30, 30d supply, fill #1

## 2022-02-25 ENCOUNTER — Other Ambulatory Visit (HOSPITAL_BASED_OUTPATIENT_CLINIC_OR_DEPARTMENT_OTHER): Payer: Self-pay | Admitting: Obstetrics & Gynecology

## 2022-02-25 ENCOUNTER — Other Ambulatory Visit (HOSPITAL_BASED_OUTPATIENT_CLINIC_OR_DEPARTMENT_OTHER): Payer: Self-pay

## 2022-02-25 DIAGNOSIS — R4586 Emotional lability: Secondary | ICD-10-CM

## 2022-02-26 ENCOUNTER — Other Ambulatory Visit (HOSPITAL_BASED_OUTPATIENT_CLINIC_OR_DEPARTMENT_OTHER): Payer: Self-pay

## 2022-02-26 MED ORDER — BUPROPION HCL ER (SR) 150 MG PO TB12
150.0000 mg | ORAL_TABLET | Freq: Two times a day (BID) | ORAL | 2 refills | Status: DC
  Filled 2022-02-26: qty 60, 30d supply, fill #0
  Filled 2022-04-24: qty 60, 30d supply, fill #1
  Filled 2022-07-24: qty 60, 30d supply, fill #2

## 2022-03-16 ENCOUNTER — Other Ambulatory Visit (HOSPITAL_BASED_OUTPATIENT_CLINIC_OR_DEPARTMENT_OTHER): Payer: Self-pay

## 2022-03-16 ENCOUNTER — Other Ambulatory Visit (HOSPITAL_BASED_OUTPATIENT_CLINIC_OR_DEPARTMENT_OTHER): Payer: Self-pay | Admitting: Obstetrics & Gynecology

## 2022-03-20 ENCOUNTER — Other Ambulatory Visit (HOSPITAL_BASED_OUTPATIENT_CLINIC_OR_DEPARTMENT_OTHER): Payer: Self-pay

## 2022-03-20 ENCOUNTER — Encounter (HOSPITAL_BASED_OUTPATIENT_CLINIC_OR_DEPARTMENT_OTHER): Payer: Self-pay | Admitting: Obstetrics & Gynecology

## 2022-03-20 ENCOUNTER — Other Ambulatory Visit (HOSPITAL_BASED_OUTPATIENT_CLINIC_OR_DEPARTMENT_OTHER): Payer: Self-pay | Admitting: Obstetrics & Gynecology

## 2022-03-21 ENCOUNTER — Other Ambulatory Visit (HOSPITAL_BASED_OUTPATIENT_CLINIC_OR_DEPARTMENT_OTHER): Payer: Self-pay

## 2022-03-21 MED ORDER — ZOLPIDEM TARTRATE 5 MG PO TABS
2.5000 mg | ORAL_TABLET | Freq: Every evening | ORAL | 1 refills | Status: DC | PRN
  Filled 2022-03-21: qty 30, 30d supply, fill #0
  Filled 2022-04-17: qty 30, 30d supply, fill #1

## 2022-03-25 ENCOUNTER — Other Ambulatory Visit (HOSPITAL_BASED_OUTPATIENT_CLINIC_OR_DEPARTMENT_OTHER): Payer: Self-pay

## 2022-03-25 ENCOUNTER — Other Ambulatory Visit: Payer: Self-pay

## 2022-04-24 ENCOUNTER — Other Ambulatory Visit: Payer: Self-pay

## 2022-05-28 ENCOUNTER — Other Ambulatory Visit (HOSPITAL_BASED_OUTPATIENT_CLINIC_OR_DEPARTMENT_OTHER): Payer: Self-pay | Admitting: Obstetrics & Gynecology

## 2022-05-31 ENCOUNTER — Other Ambulatory Visit (HOSPITAL_BASED_OUTPATIENT_CLINIC_OR_DEPARTMENT_OTHER): Payer: Self-pay

## 2022-05-31 MED ORDER — ZOLPIDEM TARTRATE 5 MG PO TABS
2.5000 mg | ORAL_TABLET | Freq: Every evening | ORAL | 1 refills | Status: DC | PRN
  Filled 2022-05-31 – 2022-06-14 (×2): qty 30, 30d supply, fill #0
  Filled 2022-07-24: qty 30, 30d supply, fill #1

## 2022-06-08 ENCOUNTER — Other Ambulatory Visit (HOSPITAL_BASED_OUTPATIENT_CLINIC_OR_DEPARTMENT_OTHER): Payer: Self-pay

## 2022-06-14 ENCOUNTER — Other Ambulatory Visit (HOSPITAL_BASED_OUTPATIENT_CLINIC_OR_DEPARTMENT_OTHER): Payer: Self-pay

## 2022-07-24 ENCOUNTER — Other Ambulatory Visit: Payer: Self-pay

## 2022-08-27 ENCOUNTER — Other Ambulatory Visit (HOSPITAL_BASED_OUTPATIENT_CLINIC_OR_DEPARTMENT_OTHER): Payer: Self-pay | Admitting: Obstetrics & Gynecology

## 2022-08-29 ENCOUNTER — Other Ambulatory Visit (HOSPITAL_BASED_OUTPATIENT_CLINIC_OR_DEPARTMENT_OTHER): Payer: Self-pay

## 2022-08-29 MED ORDER — ZOLPIDEM TARTRATE 5 MG PO TABS
2.5000 mg | ORAL_TABLET | Freq: Every evening | ORAL | 1 refills | Status: DC | PRN
  Filled 2022-08-29: qty 30, 30d supply, fill #0
  Filled 2022-09-26: qty 30, 30d supply, fill #1

## 2022-09-26 ENCOUNTER — Other Ambulatory Visit (HOSPITAL_BASED_OUTPATIENT_CLINIC_OR_DEPARTMENT_OTHER): Payer: Self-pay

## 2022-09-26 ENCOUNTER — Encounter (HOSPITAL_BASED_OUTPATIENT_CLINIC_OR_DEPARTMENT_OTHER): Payer: Self-pay

## 2022-10-17 ENCOUNTER — Other Ambulatory Visit: Payer: Self-pay | Admitting: Obstetrics & Gynecology

## 2022-10-17 DIAGNOSIS — Z1231 Encounter for screening mammogram for malignant neoplasm of breast: Secondary | ICD-10-CM

## 2022-11-15 ENCOUNTER — Ambulatory Visit
Admission: RE | Admit: 2022-11-15 | Discharge: 2022-11-15 | Disposition: A | Discharge status: Home / Self Care | Payer: 59 | Source: Ambulatory Visit

## 2022-11-15 DIAGNOSIS — Z1231 Encounter for screening mammogram for malignant neoplasm of breast: Secondary | ICD-10-CM | POA: Diagnosis not present

## 2022-11-23 ENCOUNTER — Other Ambulatory Visit (HOSPITAL_BASED_OUTPATIENT_CLINIC_OR_DEPARTMENT_OTHER): Payer: Self-pay | Admitting: Obstetrics & Gynecology

## 2022-11-25 ENCOUNTER — Other Ambulatory Visit (HOSPITAL_BASED_OUTPATIENT_CLINIC_OR_DEPARTMENT_OTHER): Payer: Self-pay

## 2022-11-25 ENCOUNTER — Other Ambulatory Visit: Payer: Self-pay

## 2022-11-25 MED ORDER — ZOLPIDEM TARTRATE 5 MG PO TABS
2.5000 mg | ORAL_TABLET | Freq: Every evening | ORAL | 2 refills | Status: DC | PRN
  Filled 2022-11-25: qty 30, 30d supply, fill #0
  Filled 2023-01-09: qty 30, 30d supply, fill #1
  Filled 2023-03-03: qty 30, 30d supply, fill #2

## 2023-01-09 ENCOUNTER — Other Ambulatory Visit (HOSPITAL_BASED_OUTPATIENT_CLINIC_OR_DEPARTMENT_OTHER): Payer: Self-pay

## 2023-01-09 ENCOUNTER — Other Ambulatory Visit: Payer: Self-pay

## 2023-03-03 ENCOUNTER — Other Ambulatory Visit: Payer: Self-pay

## 2023-03-06 NOTE — Progress Notes (Signed)
52 y.o. Angela Wells Married White or Caucasian female here for annual exam.  Has recently pulled something in her back this week when lifting something.  It's really sore.  Took some of her husband's flexeril.  Recommended having rx of her own.    Continues to work in the ER.    Denies vaginal bleeding.    Having issues with anger.  She wonders if this is hormone related.    Is not taking thyroid medication.  It gives her headaches.  She's tried generic and branded.  Has seen Dr. Lonzo Cloud.  No LMP recorded. Patient has had an ablation.          Sexually active: Yes.    The current method of family planning is post menopausal status.     Health Maintenance: Pap:  01/17/2020 Negative History of abnormal Pap:  h/o cryo MMG:  11/15/2022 Negative Colonoscopy:  03/04/2019, follow up 5 years BMD:   not indicated Screening Labs: ordered today   reports that she quit smoking about 24 years ago. Her smoking use included cigarettes. She started smoking about 37 years ago. She has a 3.3 pack-year smoking history. She has never used smokeless tobacco. She reports current alcohol use. She reports that she does not use drugs.  Past Medical History:  Diagnosis Date   Abnormal Pap smear    Anxiety    Arthritis    hands   Depression    Dyslipidemia with elevated low density lipoprotein (LDL) cholesterol and abnormally low high density lipoprotein cholesterol    diet controlled   Eczema    High cholesterol    Hypertension    resolved - lost weight, no med   Hyperthyroidism    SVD (spontaneous vaginal delivery)    x 2    Past Surgical History:  Procedure Laterality Date   DILATATION & CURETTAGE/HYSTEROSCOPY WITH MYOSURE N/A 06/16/2015   Procedure: DILATATION & CURETTAGE/HYSTEROSCOPY WITH Novasure;  Surgeon: Jerene Bears, MD;  Location: [facility];  Service: Gynecology;  Laterality: N/A;   GYNECOLOGIC CRYOSURGERY     LAPAROSCOPIC TUBAL LIGATION  03/04/2011   Procedure: LAPAROSCOPIC TUBAL  LIGATION;  Surgeon: Lum Keas;  Location: [facility];  Service: Gynecology;  Laterality: Bilateral;  with filshie clips   TUBAL LIGATION  03/04/2011   Procedure: ESSURE TUBAL STERILIZATION;  Surgeon: Lum Keas;  Location: [facility];  Service: Gynecology;  Laterality: Bilateral;  Attempted   WISDOM TOOTH EXTRACTION      Current Outpatient Medications  Medication Sig Dispense Refill   acetaminophen (TYLENOL) 325 MG tablet Take 650 mg by mouth every 6 (six) hours as needed for mild pain or headache.     BL EVENING PRIMROSE OIL PO Take 1 capsule by mouth 2 (two) times daily. Reported on 06/05/2015     buPROPion (WELLBUTRIN SR) 150 MG 12 hr tablet Take 1 tablet (150 mg total) by mouth 2 (two) times daily. 60 tablet 2   Cyanocobalamin (VITAMIN B 12) 100 MCG LOZG Take by mouth.     ibuprofen (ADVIL) 400 MG tablet Take 400 mg by mouth every 6 (six) hours as needed.     Multiple Vitamins-Minerals (MULTIVITAMINS THER. W/MINERALS) TABS Take 1 tablet by mouth daily. Reported on 06/05/2015     SYNTHROID 75 MCG tablet Take 1 tablet (75 mcg total) by mouth daily before breakfast. 30 tablet 6   zolpidem (AMBIEN) 5 MG tablet Take 0.5-1 tablets (2.5-5 mg total) by mouth at bedtime as needed for insomnia. 30 tablet 2  No current facility-administered medications for this visit.    Family History  Problem Relation Age of Onset   Hypertension Father    Hyperlipidemia Father    Colon polyps Father    Thyroid disease Mother    Hypertension Sister    Hyperlipidemia Sister    Healthy Sister    Healthy Daughter    Healthy Son    Colon cancer Neg Hx    Esophageal cancer Neg Hx    Rectal cancer Neg Hx    Stomach cancer Neg Hx     ROS: Constitutional: negative Genitourinary:negative  Exam:   BP 128/60   Pulse 67   Ht 5\' 7"  (1.702 m)   Wt 198 lb 3.2 oz (89.9 kg)   BMI 31.04 kg/m   Height: 5\' 7"  (170.2 cm)  General appearance: alert, cooperative and appears stated age Head: Normocephalic,  without obvious abnormality, atraumatic Neck: no adenopathy, supple, symmetrical, trachea midline and thyroid normal to inspection and palpation Lungs: clear to auscultation bilaterally Breasts: normal appearance, no masses or tenderness Heart: regular rate and rhythm Abdomen: soft, non-tender; bowel sounds normal; no masses,  no organomegaly Extremities: extremities normal, atraumatic, no cyanosis or edema Skin: Skin color, texture, turgor normal. No rashes or lesions Lymph nodes: Cervical, supraclavicular, and axillary nodes normal. No abnormal inguinal nodes palpated Neurologic: Grossly normal   Pelvic: External genitalia:  no lesions              Urethra:  normal appearing urethra with no masses, tenderness or lesions              Bartholins and Skenes: normal                 Vagina: normal appearing vagina with normal color and no discharge, no lesions              Cervix: no lesions              Pap taken: Yes.   Bimanual Exam:  Uterus:  normal size, contour, position, consistency, mobility, non-tender              Adnexa: normal adnexa and no mass, fullness, tenderness               Rectovaginal: Confirms               Anus:  normal sphincter tone, no lesions  Chaperone, Ina Homes, CMA, was present for exam.  Assessment/Plan: 1. Well woman exam with routine gynecological exam (Primary) - Pap smear with neg HR HPV obtained - Mammogram 11/2021 - Colonoscopy 2020 - Bone mineral density not indicated - lab work ordered - vaccines reviewed/updated  2. Cervical cancer screening - Cytology - PAP( [place])  3. Postmenopausal - will try estradiol patch for 1 month to see if helps.  Will need to add progesterone if this does help her symptoms - estradiol (VIVELLE-DOT) 0.05 MG/24HR patch; Place 1 patch (0.05 mg total) onto the skin 2 (two) times a week.  Dispense: 8 patch; Refill: 1  4. Muscle pain - cyclobenzaprine (FLEXERIL) 5 MG tablet; Take 1 tablet (5 mg total) by  mouth 3 (three) times daily as needed for muscle spasms.  Dispense: 30 tablet; Refill: 0 - ibuprofen (ADVIL) 800 MG tablet; Take 1 tablet (800 mg total) by mouth every 8 (eight) hours as needed.  Dispense: 30 tablet; Refill: 0  5. Other insomnia - zolpidem (AMBIEN) 5 MG tablet; Take 0.5-1 tablets (2.5-5 mg total) by mouth  at bedtime as needed for insomnia.  Dispense: 30 tablet; Refill: 2  6. BMI 31.0-31.9,adult - Hemoglobin A1c - Comprehensive metabolic panel - Lipid panel  7. Acquired hypothyroidism - T4, free - TSH

## 2023-03-07 ENCOUNTER — Ambulatory Visit (INDEPENDENT_AMBULATORY_CARE_PROVIDER_SITE_OTHER): Payer: 59 | Admitting: Obstetrics & Gynecology

## 2023-03-07 ENCOUNTER — Encounter (HOSPITAL_BASED_OUTPATIENT_CLINIC_OR_DEPARTMENT_OTHER): Payer: Self-pay | Admitting: Obstetrics & Gynecology

## 2023-03-07 ENCOUNTER — Other Ambulatory Visit (HOSPITAL_BASED_OUTPATIENT_CLINIC_OR_DEPARTMENT_OTHER): Payer: Self-pay

## 2023-03-07 ENCOUNTER — Other Ambulatory Visit (HOSPITAL_COMMUNITY)
Admission: RE | Admit: 2023-03-07 | Discharge: 2023-03-07 | Disposition: A | Discharge status: Home / Self Care | Payer: 59 | Source: Ambulatory Visit | Attending: Obstetrics & Gynecology | Admitting: Obstetrics & Gynecology

## 2023-03-07 VITALS — BP 128/60 | HR 67 | Ht 67.0 in | Wt 198.2 lb

## 2023-03-07 DIAGNOSIS — M791 Myalgia, unspecified site: Secondary | ICD-10-CM | POA: Diagnosis not present

## 2023-03-07 DIAGNOSIS — Z124 Encounter for screening for malignant neoplasm of cervix: Secondary | ICD-10-CM | POA: Diagnosis not present

## 2023-03-07 DIAGNOSIS — G4709 Other insomnia: Secondary | ICD-10-CM | POA: Diagnosis not present

## 2023-03-07 DIAGNOSIS — Z01419 Encounter for gynecological examination (general) (routine) without abnormal findings: Secondary | ICD-10-CM | POA: Diagnosis not present

## 2023-03-07 DIAGNOSIS — E039 Hypothyroidism, unspecified: Secondary | ICD-10-CM | POA: Diagnosis not present

## 2023-03-07 DIAGNOSIS — Z780 Asymptomatic menopausal state: Secondary | ICD-10-CM | POA: Diagnosis not present

## 2023-03-07 DIAGNOSIS — Z6831 Body mass index (BMI) 31.0-31.9, adult: Secondary | ICD-10-CM

## 2023-03-07 MED ORDER — IBUPROFEN 800 MG PO TABS
800.0000 mg | ORAL_TABLET | Freq: Three times a day (TID) | ORAL | 0 refills | Status: AC | PRN
  Filled 2023-03-07: qty 30, 10d supply, fill #0

## 2023-03-07 MED ORDER — ZOLPIDEM TARTRATE 5 MG PO TABS
2.5000 mg | ORAL_TABLET | Freq: Every evening | ORAL | 2 refills | Status: DC | PRN
  Filled 2023-03-07 – 2023-05-20 (×2): qty 30, 30d supply, fill #0
  Filled 2023-07-26: qty 30, 30d supply, fill #1

## 2023-03-07 MED ORDER — CYCLOBENZAPRINE HCL 5 MG PO TABS
5.0000 mg | ORAL_TABLET | Freq: Three times a day (TID) | ORAL | 0 refills | Status: AC | PRN
Start: 2023-03-07 — End: ?
  Filled 2023-03-07: qty 30, 10d supply, fill #0

## 2023-03-07 MED ORDER — ESTRADIOL 0.05 MG/24HR TD PTTW
1.0000 | MEDICATED_PATCH | TRANSDERMAL | 1 refills | Status: DC
  Filled 2023-03-07: qty 8, 28d supply, fill #0
  Filled 2023-04-17: qty 8, 28d supply, fill #1

## 2023-03-08 LAB — COMPREHENSIVE METABOLIC PANEL
ALT: 18 [IU]/L (ref 0–32)
AST: 19 [IU]/L (ref 0–40)
Albumin: 4.6 g/dL (ref 3.8–4.9)
Alkaline Phosphatase: 55 [IU]/L (ref 44–121)
BUN/Creatinine Ratio: 16 (ref 9–23)
BUN: 13 mg/dL (ref 6–24)
Bilirubin Total: 0.4 mg/dL (ref 0.0–1.2)
CO2: 24 mmol/L (ref 20–29)
Calcium: 9.5 mg/dL (ref 8.7–10.2)
Chloride: 104 mmol/L (ref 96–106)
Creatinine, Ser: 0.83 mg/dL (ref 0.57–1.00)
Globulin, Total: 2.3 g/dL (ref 1.5–4.5)
Glucose: 70 mg/dL (ref 70–99)
Potassium: 4.4 mmol/L (ref 3.5–5.2)
Sodium: 143 mmol/L (ref 134–144)
Total Protein: 6.9 g/dL (ref 6.0–8.5)
eGFR: 84 mL/min/{1.73_m2} (ref >59)

## 2023-03-08 LAB — LIPID PANEL
Chol/HDL Ratio: 4.1 {ratio} (ref 0.0–4.4)
Cholesterol, Total: 278 mg/dL — ABNORMAL HIGH (ref 100–199)
HDL: 67 mg/dL (ref >39)
LDL Chol Calc (NIH): 183 mg/dL — ABNORMAL HIGH (ref 0–99)
Triglycerides: 152 mg/dL — ABNORMAL HIGH (ref 0–149)
VLDL Cholesterol Cal: 28 mg/dL (ref 5–40)

## 2023-03-08 LAB — HEMOGLOBIN A1C
Est. average glucose Bld gHb Est-mCnc: 120 mg/dL
Hgb A1c MFr Bld: 5.8 % — ABNORMAL HIGH (ref 4.8–5.6)

## 2023-03-08 LAB — T4, FREE: Free T4: 0.68 ng/dL — ABNORMAL LOW (ref 0.82–1.77)

## 2023-03-08 LAB — TSH: TSH: 29.4 u[IU]/mL — ABNORMAL HIGH (ref 0.450–4.500)

## 2023-03-11 LAB — CYTOLOGY - PAP
Comment: NEGATIVE
Diagnosis: NEGATIVE
High risk HPV: NEGATIVE

## 2023-03-17 ENCOUNTER — Encounter (HOSPITAL_BASED_OUTPATIENT_CLINIC_OR_DEPARTMENT_OTHER): Payer: Self-pay | Admitting: Obstetrics & Gynecology

## 2023-03-17 DIAGNOSIS — E039 Hypothyroidism, unspecified: Secondary | ICD-10-CM | POA: Insufficient documentation

## 2023-03-17 DIAGNOSIS — R7303 Prediabetes: Secondary | ICD-10-CM | POA: Insufficient documentation

## 2023-03-18 ENCOUNTER — Encounter (HOSPITAL_BASED_OUTPATIENT_CLINIC_OR_DEPARTMENT_OTHER): Payer: Self-pay | Admitting: Obstetrics & Gynecology

## 2023-03-25 ENCOUNTER — Other Ambulatory Visit (HOSPITAL_BASED_OUTPATIENT_CLINIC_OR_DEPARTMENT_OTHER): Payer: Self-pay

## 2023-03-25 ENCOUNTER — Telehealth: Payer: Self-pay

## 2023-03-25 NOTE — Telephone Encounter (Signed)
 Contact patient for appointment as she has not been seen since 2022

## 2023-03-25 NOTE — Telephone Encounter (Signed)
LMx1 to call office to schedule appointment.

## 2023-04-04 ENCOUNTER — Other Ambulatory Visit (HOSPITAL_BASED_OUTPATIENT_CLINIC_OR_DEPARTMENT_OTHER): Payer: Self-pay

## 2023-04-04 ENCOUNTER — Ambulatory Visit (INDEPENDENT_AMBULATORY_CARE_PROVIDER_SITE_OTHER): Payer: 59 | Admitting: Internal Medicine

## 2023-04-04 ENCOUNTER — Encounter: Payer: Self-pay | Admitting: Internal Medicine

## 2023-04-04 VITALS — BP 110/74 | HR 84 | Ht 67.0 in | Wt 198.0 lb

## 2023-04-04 DIAGNOSIS — E039 Hypothyroidism, unspecified: Secondary | ICD-10-CM | POA: Diagnosis not present

## 2023-04-04 MED ORDER — THYROID 15 MG PO TABS
15.0000 mg | ORAL_TABLET | Freq: Every day | ORAL | 3 refills | Status: DC
  Filled 2023-04-04: qty 90, 90d supply, fill #0

## 2023-04-04 NOTE — Progress Notes (Signed)
Name: Angela Wells  MRN/ DOB: [id number], May 03, 1970    Age/ Sex: 53 y.o., female     PCP: Carlean Jews, NP   Reason for Endocrinology Evaluation: Subclinical hypothyroidism     Initial Endocrinology Clinic Visit: 10/08/2019    PATIENT IDENTIFIER: Angela Wells is a 53 y.o., female with a past medical history of Dyslipidemia. She has followed with [facility] since 10/08/2018 for consultative assistance with management of her subclinical hypothyroisism.   HISTORICAL SUMMARY:  Pt noted to have an elevated TSH at 8.490 uIU/mL with normal FT4 during routine labs in 07/2018. Repeat labs a month later confirmed similar results.   Pt was started on LT-4 replacement at the time   Mother with Hashimoto's   SUBJECTIVE:    Today (04/04/2023):  Angela Wells is here for a follow up on hypothyroidism.She has not been to our clinic in 31 months.   She has stopped the levothyroxine and synthroid due to headaches and made her angry  She is interested in desiccated thyroid hormone therapy  Her most recent TSH through PCPs office 29.4 u IU/mL  Weight continues to fluctuate Has noted constipation  Lacks energy Has  chronic depression but its stable  No local neck symptoms     HISTORY:  Past Medical History:  Past Medical History:  Diagnosis Date   Abnormal Pap smear    Anxiety    Arthritis    hands   Depression    Dyslipidemia with elevated low density lipoprotein (LDL) cholesterol and abnormally low high density lipoprotein cholesterol    diet controlled   Eczema    High cholesterol    Hypertension    resolved - lost weight, no med   Hyperthyroidism    SVD (spontaneous vaginal delivery)    x 2   Past Surgical History:  Past Surgical History:  Procedure Laterality Date   DILATATION & CURETTAGE/HYSTEROSCOPY WITH MYOSURE N/A 06/16/2015   Procedure: DILATATION & CURETTAGE/HYSTEROSCOPY WITH Novasure;  Surgeon: Jerene Bears, MD;  Location: [facility];   Service: Gynecology;  Laterality: N/A;   GYNECOLOGIC CRYOSURGERY     LAPAROSCOPIC TUBAL LIGATION  03/04/2011   Procedure: LAPAROSCOPIC TUBAL LIGATION;  Surgeon: Lum Keas;  Location: [facility];  Service: Gynecology;  Laterality: Bilateral;  with filshie clips   TUBAL LIGATION  03/04/2011   Procedure: ESSURE TUBAL STERILIZATION;  Surgeon: Lum Keas;  Location: [facility];  Service: Gynecology;  Laterality: Bilateral;  Attempted   WISDOM TOOTH EXTRACTION     Social History:  reports that she quit smoking about 25 years ago. Her smoking use included cigarettes. She started smoking about 38 years ago. She has a 3.3 pack-year smoking history. She has never used smokeless tobacco. She reports current alcohol use. She reports that she does not use drugs. Family History:  Family History  Problem Relation Age of Onset   Hypertension Father    Hyperlipidemia Father    Colon polyps Father    Thyroid disease Mother    Hypertension Sister    Hyperlipidemia Sister    Healthy Sister    Healthy Daughter    Healthy Son    Colon cancer Neg Hx    Esophageal cancer Neg Hx    Rectal cancer Neg Hx    Stomach cancer Neg Hx      HOME MEDICATIONS: Allergies as of 04/04/2023   No Known Allergies      Medication List  Accurate as of April 04, 2023  8:31 AM. If you have any questions, ask your nurse or doctor.          STOP taking these medications    Synthroid 75 MCG tablet Generic drug: levothyroxine Stopped by: Johnney Ou Lainee Lehrman       TAKE these medications    acetaminophen 325 MG tablet Commonly known as: TYLENOL Take 650 mg by mouth every 6 (six) hours as needed for mild pain or headache.   BL EVENING PRIMROSE OIL PO Take 1 capsule by mouth 2 (two) times daily. Reported on 06/05/2015   cyclobenzaprine 5 MG tablet Commonly known as: FLEXERIL Take 1 tablet (5 mg total) by mouth 3 (three) times daily as needed for muscle spasms.   estradiol 0.05 MG/24HR  patch Commonly known as: VIVELLE-DOT Place 1 patch (0.05 mg total) onto the skin 2 (two) times a week.   ibuprofen 800 MG tablet Commonly known as: ADVIL Take 1 tablet (800 mg total) by mouth every 8 (eight) hours as needed.   multivitamins ther. w/minerals Tabs tablet Take 1 tablet by mouth daily. Reported on 06/05/2015   thyroid 15 MG tablet Commonly known as: NP Thyroid Take 1 tablet (15 mg total) by mouth daily. Started by: Johnney Ou Ceferino Lang   Vitamin B 12 100 MCG Lozg Take by mouth.   zolpidem 5 MG tablet Commonly known as: AMBIEN Take 0.5-1 tablets (2.5-5 mg total) by mouth at bedtime as needed for insomnia.          OBJECTIVE:   PHYSICAL EXAM: VS: BP 110/74 (BP Location: Left Arm, Patient Position: Sitting, Cuff Size: Large)   Pulse 84   Ht 5\' 7"  (1.702 m)   Wt 198 lb (89.8 kg)   SpO2 96%   BMI 31.01 kg/m    EXAM: General: Pt appears well and is in NAD  Neck: General: Supple without adenopathy. Thyroid:  No goiter or nodules appreciated.   Lungs: Clear with good BS bilat  Heart: Auscultation: RRR.  Abdomen: soft, nontender  Extremities:  BL LE: No pretibial edema   Mental Status: Judgment, insight: Intact Orientation: Oriented to time, place, and person Mood and affect: No depression, anxiety, or agitation     DATA REVIEWED:   Latest Reference Range & Units 03/07/23 09:10  TSH 0.450 - 4.500 uIU/mL 29.400 (H)  T4,Free(Direct) 0.82 - 1.77 ng/dL 4.08 (L)    ASSESSMENT / PLAN / RECOMMENDATIONS:   Hypothyroidism:  -Intolerant to levothyroxine or Synthroid -Patient is clinically hypothyroid -Patient interested in desiccated hormones - We discussed armour thyroid, has non-physiologic levels of T4:T3  -I cautioned the patient against palpitations, if she develops any side effects to Armour Thyroid 50 mg, she may opt to start at half a tablet until she builds tolerance -We did discuss the importance of LT-for replacement and the risk of myxedema  coma - No local neck symptoms  -Repeat labs in 2 months   Medications  Start NP thyroid 15 mg daily  F/U in 4 months Labs in 8 weeks   Signed electronically by: Lyndle Herrlich, MD  [facility] Endocrin[facility]  [facility] [address] [place], [place] [address] Phone: [phone] FAX: [phone]      CC: Carlean Jews, NP [address] [place] [address] Phone: [phone]  Fax: [phone]   Return to Endocrinology clinic as below: Future Appointments  Date Time Provider Department Center  06/03/2023  8:15 AM [facility]  [facility] None  08/06/2023  8:10 AM Kalana Yust, Konrad Dolores, MD [facility] None  03/15/2024  8:35 AM Jerene Bears, MD [facility]

## 2023-04-17 ENCOUNTER — Other Ambulatory Visit: Payer: Self-pay

## 2023-04-27 ENCOUNTER — Encounter (HOSPITAL_BASED_OUTPATIENT_CLINIC_OR_DEPARTMENT_OTHER): Payer: Self-pay | Admitting: Obstetrics & Gynecology

## 2023-04-29 ENCOUNTER — Other Ambulatory Visit (HOSPITAL_BASED_OUTPATIENT_CLINIC_OR_DEPARTMENT_OTHER): Payer: Self-pay

## 2023-04-29 ENCOUNTER — Other Ambulatory Visit (HOSPITAL_BASED_OUTPATIENT_CLINIC_OR_DEPARTMENT_OTHER): Payer: Self-pay | Admitting: Obstetrics & Gynecology

## 2023-04-29 DIAGNOSIS — Z780 Asymptomatic menopausal state: Secondary | ICD-10-CM

## 2023-04-29 MED ORDER — PROGESTERONE 200 MG PO CAPS
ORAL_CAPSULE | ORAL | 3 refills | Status: DC
  Filled 2023-04-29: qty 45, 90d supply, fill #0
  Filled 2023-07-09 – 2023-07-12 (×2): qty 45, 90d supply, fill #1

## 2023-04-29 MED ORDER — ESTRADIOL 0.05 MG/24HR TD PTTW
1.0000 | MEDICATED_PATCH | TRANSDERMAL | 3 refills | Status: DC
  Filled 2023-04-29 – 2023-06-05 (×2): qty 24, 84d supply, fill #0
  Filled 2023-06-13: qty 8, 28d supply, fill #0
  Filled 2023-09-09: qty 8, 28d supply, fill #1
  Filled 2023-11-07: qty 8, 28d supply, fill #2
  Filled 2024-01-28: qty 8, 28d supply, fill #3
  Filled 2024-02-27: qty 8, 28d supply, fill #4
  Filled ????-??-??: fill #2

## 2023-04-30 ENCOUNTER — Other Ambulatory Visit (HOSPITAL_BASED_OUTPATIENT_CLINIC_OR_DEPARTMENT_OTHER): Payer: Self-pay

## 2023-04-30 ENCOUNTER — Other Ambulatory Visit: Payer: Self-pay

## 2023-05-20 ENCOUNTER — Other Ambulatory Visit (HOSPITAL_BASED_OUTPATIENT_CLINIC_OR_DEPARTMENT_OTHER): Payer: Self-pay

## 2023-05-20 ENCOUNTER — Other Ambulatory Visit: Payer: Self-pay

## 2023-06-03 ENCOUNTER — Other Ambulatory Visit: Payer: 59

## 2023-06-03 DIAGNOSIS — E039 Hypothyroidism, unspecified: Secondary | ICD-10-CM | POA: Diagnosis not present

## 2023-06-04 ENCOUNTER — Other Ambulatory Visit (HOSPITAL_BASED_OUTPATIENT_CLINIC_OR_DEPARTMENT_OTHER): Payer: Self-pay

## 2023-06-04 ENCOUNTER — Encounter: Payer: Self-pay | Admitting: Internal Medicine

## 2023-06-04 ENCOUNTER — Other Ambulatory Visit: Payer: Self-pay | Admitting: Internal Medicine

## 2023-06-04 LAB — TSH: TSH: 10.38 m[IU]/L — ABNORMAL HIGH

## 2023-06-04 MED ORDER — THYROID 15 MG PO TABS
15.0000 mg | ORAL_TABLET | ORAL | 1 refills | Status: DC
  Filled 2023-06-04 – 2023-06-05 (×2): qty 104, fill #0
  Filled 2023-06-13 (×2): qty 104, 90d supply, fill #0

## 2023-06-05 ENCOUNTER — Other Ambulatory Visit: Payer: Self-pay

## 2023-06-05 ENCOUNTER — Other Ambulatory Visit (HOSPITAL_BASED_OUTPATIENT_CLINIC_OR_DEPARTMENT_OTHER): Payer: Self-pay

## 2023-06-13 ENCOUNTER — Other Ambulatory Visit (HOSPITAL_BASED_OUTPATIENT_CLINIC_OR_DEPARTMENT_OTHER): Payer: Self-pay

## 2023-06-17 ENCOUNTER — Other Ambulatory Visit: Payer: Self-pay

## 2023-06-17 ENCOUNTER — Other Ambulatory Visit (HOSPITAL_BASED_OUTPATIENT_CLINIC_OR_DEPARTMENT_OTHER): Payer: Self-pay

## 2023-07-10 ENCOUNTER — Other Ambulatory Visit: Payer: Self-pay

## 2023-07-11 ENCOUNTER — Other Ambulatory Visit (HOSPITAL_BASED_OUTPATIENT_CLINIC_OR_DEPARTMENT_OTHER): Payer: Self-pay

## 2023-07-14 ENCOUNTER — Other Ambulatory Visit (HOSPITAL_BASED_OUTPATIENT_CLINIC_OR_DEPARTMENT_OTHER): Payer: Self-pay

## 2023-07-28 ENCOUNTER — Other Ambulatory Visit: Payer: Self-pay

## 2023-08-05 NOTE — Progress Notes (Deleted)
 Name: Angela Wells  MRN/ DOB: [id number], Jun 06, 1970    Age/ Sex: 53 y.o., female     PCP: Alphonsus Ash, Heather  E, NP   Reason for Endocrinology Evaluation: Subclinical hypothyroidism     Initial Endocrinology Clinic Visit: 10/08/2019    PATIENT IDENTIFIER: Angela Wells is a 53 y.o., female with a past medical history of Dyslipidemia. She has followed with [facility] since 10/08/2018 for consultative assistance with management of her subclinical hypothyroisism.   HISTORICAL SUMMARY:  Pt noted to have an elevated TSH at 8.490 uIU/mL with normal FT4 during routine labs in 07/2018. Repeat labs a month later confirmed similar results.   Pt was started on LT-4 replacement at the time   Mother with Hashimoto's   SUBJECTIVE:    Today (08/05/2023):  Ms. Mendel is here for a follow up on hypothyroidism.She has not been to our clinic in 31 months.   She has stopped the levothyroxine  and synthroid  due to headaches and made her angry  She is interested in desiccated thyroid  hormone therapy  Her most recent TSH through PCPs office 29.4 u IU/mL  Weight continues to fluctuate Has noted constipation  Lacks energy Has  chronic depression but its stable  No local neck symptoms     HISTORY:  Past Medical History:  Past Medical History:  Diagnosis Date   Abnormal Pap smear    Anxiety    Arthritis    hands   Depression    Dyslipidemia with elevated low density lipoprotein (LDL) cholesterol and abnormally low high density lipoprotein cholesterol    diet controlled   Eczema    High cholesterol    Hypertension    resolved - lost weight, no med   Hyperthyroidism    SVD (spontaneous vaginal delivery)    x 2   Past Surgical History:  Past Surgical History:  Procedure Laterality Date   DILATATION & CURETTAGE/HYSTEROSCOPY WITH MYOSURE N/A 06/16/2015   Procedure: DILATATION & CURETTAGE/HYSTEROSCOPY WITH Novasure;  Surgeon: Lillian Rein, MD;  [facility]: [facility];   Service: Gynecology;  Laterality: N/A;   GYNECOLOGIC CRYOSURGERY     LAPAROSCOPIC TUBAL LIGATION  03/04/2011   Procedure: LAPAROSCOPIC TUBAL LIGATION;  Surgeon: Ammie Bale;  [facility];  Service: Gynecology;  Laterality: Bilateral;  with filshie clips   TUBAL LIGATION  03/04/2011   Procedure: ESSURE TUBAL STERILIZATION;  Surgeon: Ammie Bale;  Location: [facility];  Service: Gynecology;  Laterality: Bilateral;  Attempted   WISDOM TOOTH EXTRACTION     Social History:  reports that she quit smoking about 25 years ago. Her smoking use included cigarettes. She started smoking about 38 years ago. She has a 3.3 pack-year smoking history. She has never used smokeless tobacco. She reports current alcohol use. She reports that she does not use drugs. Family History:  Family History  Problem Relation Age of Onset   Hypertension Father    Hyperlipidemia Father    Colon polyps Father    Thyroid  disease Mother    Hypertension Sister    Hyperlipidemia Sister    Healthy Sister    Healthy Daughter    Healthy Son    Colon cancer Neg Hx    Esophageal cancer Neg Hx    Rectal cancer Neg Hx    Stomach cancer Neg Hx      HOME MEDICATIONS: Allergies as of 08/06/2023   No Known Allergies      Medication List  Accurate as of Aug 05, 2023  1:00 PM. If you have any questions, ask your nurse or doctor.          acetaminophen  325 MG tablet Commonly known as: TYLENOL  Take 650 mg by mouth every 6 (six) hours as needed for mild pain or headache.   BL EVENING PRIMROSE OIL PO Take 1 capsule by mouth 2 (two) times daily. Reported on 06/05/2015   cyclobenzaprine  5 MG tablet Commonly known as: FLEXERIL  Take 1 tablet (5 mg total) by mouth 3 (three) times daily as needed for muscle spasms.   estradiol  0.05 MG/24HR patch Commonly known as: VIVELLE -DOT Place 1 patch (0.05 mg total) onto the skin 2 (two) times a week.   ibuprofen  800 MG tablet Commonly known as: ADVIL  Take 1  tablet (800 mg total) by mouth every 8 (eight) hours as needed.   multivitamins ther. w/minerals Tabs tablet Take 1 tablet by mouth daily. Reported on 06/05/2015   NP Thyroid  15 MG tablet Generic drug: thyroid  Take 1 tablet (15 mg total) by mouth daily, expect take 2 tablets on Sundays.   progesterone  200 MG capsule Commonly known as: PROMETRIUM  Take 1 capsules nightly days 1-15 each month.   Vitamin B 12 100 MCG Lozg Take by mouth.   zolpidem  5 MG tablet Commonly known as: AMBIEN  Take 0.5-1 tablets (2.5-5 mg total) by mouth at bedtime as needed for insomnia.          OBJECTIVE:   PHYSICAL EXAM: VS: There were no vitals taken for this visit.   EXAM: General: Pt appears well and is in NAD  Neck: General: Supple without adenopathy. Thyroid :  No goiter or nodules appreciated.   Lungs: Clear with good BS bilat  Heart: Auscultation: RRR.  Abdomen: soft, nontender  Extremities:  BL LE: No pretibial edema   Mental Status: Judgment, insight: Intact Orientation: Oriented to time, place, and person Mood and affect: No depression, anxiety, or agitation     DATA REVIEWED:   Latest Reference Range & Units 03/07/23 09:10  TSH 0.450 - 4.500 uIU/mL 29.400 (H)  T4,Free(Direct) 0.82 - 1.77 ng/dL 9.60 (L)    ASSESSMENT / PLAN / RECOMMENDATIONS:   Hypothyroidism:  -Intolerant to levothyroxine  or Synthroid  -Patient is clinically hypothyroid -Patient interested in desiccated hormones - We discussed armour thyroid , has non-physiologic levels of T4:T3  -I cautioned the patient against palpitations, if she develops any side effects to Armour Thyroid  50 mg, she may opt to start at half a tablet until she builds tolerance -We did discuss the importance of LT-for replacement and the risk of myxedema coma - No local neck symptoms  -Repeat labs in 2 months   Medications  Start NP thyroid  15 mg daily  F/U in 4 months Labs in 8 weeks   Signed electronically by: Natale Bail, MD  [facility] Endocrinology  [facility] [address] [place] [place], [place] [address] Phone: [phone] FAX: [phone]      CC: Alphonsus Ash, Heather  E, NP [address] [place] [address] Phone: [phone]  Fax: [phone]   Return to Endocrinology clinic as below: Future Appointments  Date Time Provider Department Center  08/06/2023  8:10 AM Beverlyn Mcginness, Julian Obey, MD [facility] None  03/15/2024  8:35 AM Lillian Rein, MD [facility]

## 2023-08-06 ENCOUNTER — Ambulatory Visit: Payer: 59 | Admitting: Internal Medicine

## 2023-08-20 ENCOUNTER — Ambulatory Visit: Admitting: Internal Medicine

## 2023-08-28 ENCOUNTER — Encounter (HOSPITAL_BASED_OUTPATIENT_CLINIC_OR_DEPARTMENT_OTHER): Payer: Self-pay | Admitting: Obstetrics & Gynecology

## 2023-08-29 ENCOUNTER — Other Ambulatory Visit (HOSPITAL_BASED_OUTPATIENT_CLINIC_OR_DEPARTMENT_OTHER): Payer: Self-pay

## 2023-08-29 ENCOUNTER — Other Ambulatory Visit (HOSPITAL_BASED_OUTPATIENT_CLINIC_OR_DEPARTMENT_OTHER): Payer: Self-pay | Admitting: *Deleted

## 2023-08-29 ENCOUNTER — Encounter: Payer: Self-pay | Admitting: Pediatrics

## 2023-08-29 MED ORDER — PROGESTERONE 200 MG PO CAPS
200.0000 mg | ORAL_CAPSULE | Freq: Every evening | ORAL | 0 refills | Status: DC
  Filled 2023-08-29 – 2023-10-03 (×4): qty 30, 30d supply, fill #0

## 2023-08-29 NOTE — Progress Notes (Signed)
 Prescription changed to nightly dose.

## 2023-09-09 ENCOUNTER — Other Ambulatory Visit (HOSPITAL_BASED_OUTPATIENT_CLINIC_OR_DEPARTMENT_OTHER): Payer: Self-pay

## 2023-09-12 ENCOUNTER — Other Ambulatory Visit (HOSPITAL_BASED_OUTPATIENT_CLINIC_OR_DEPARTMENT_OTHER): Payer: Self-pay

## 2023-09-17 ENCOUNTER — Encounter: Payer: Self-pay | Admitting: Internal Medicine

## 2023-09-17 ENCOUNTER — Ambulatory Visit (INDEPENDENT_AMBULATORY_CARE_PROVIDER_SITE_OTHER): Admitting: Internal Medicine

## 2023-09-17 VITALS — BP 120/80 | HR 99 | Ht 67.0 in | Wt 187.4 lb

## 2023-09-17 DIAGNOSIS — E039 Hypothyroidism, unspecified: Secondary | ICD-10-CM

## 2023-09-17 LAB — TSH: TSH: 7.16 m[IU]/L — ABNORMAL HIGH

## 2023-09-17 NOTE — Progress Notes (Unsigned)
 Name: Angela Wells  MRN/ DOB: [id number], [birth year]    Age/ Sex: 52 y.o., female     PCP: Hanford Tyrell BRAVO, NP   Reason for Endocrinology Evaluation: Subclinical hypothyroidism     Initial Endocrinology Clinic Visit: 10/08/2019    PATIENT IDENTIFIER: Angela Wells is a 53 y.o., female with a past medical history of Dyslipidemia. She has followed with [facility] since 10/08/2018 for consultative assistance with management of her subclinical hypothyroisism.   HISTORICAL SUMMARY:  Pt noted to have an elevated TSH at 8.490 uIU/mL with normal FT4 during routine labs in 07/2018. Repeat labs a month later confirmed similar results.   Pt was started on LT-4 replacement at the time developed headache to levothyroxine  as well as she was feeling angry on it.  We started her on desiccated thyroid  hormone, after 4 months she was complaining of headaches and attributed that to the desiccated hormones but I did explain to the patient that I did not think her headaches are related after 4 months of desiccated hormonal use.   Mother with Hashimoto's   SUBJECTIVE:    Today (09/17/2023):  Angela Wells is here for a follow up on hypothyroidism.She has not been to our clinic in 31 months.    Pt has been noted with weight loss  Headaches have resolved  She was started on estrogen patch and progesterone  through Gyn  Pt with mild constipation - uses stool softeners as needed  Energy level is improving  Sleep has been variable  No local neck symptoms   NP thyroid  15 mg 2 tabs on Sundays and 1 tab Monday through Saturday      HISTORY:  Past Medical History:  Past Medical History:  Diagnosis Date   Abnormal Pap smear    Anxiety    Arthritis    hands   Depression    Dyslipidemia with elevated low density lipoprotein (LDL) cholesterol and abnormally low high density lipoprotein cholesterol    diet controlled   Eczema    High cholesterol    Hypertension    resolved -  lost weight, no med   Hyperthyroidism    SVD (spontaneous vaginal delivery)    x 2   Past Surgical History:  Past Surgical History:  Procedure Laterality Date   DILATATION & CURETTAGE/HYSTEROSCOPY WITH MYOSURE N/A 06/16/2015   Procedure: DILATATION & CURETTAGE/HYSTEROSCOPY WITH Novasure;  Surgeon: Ronal GORMAN Pinal, MD;  Location: [facility];  Service: Gynecology;  Laterality: N/A;   GYNECOLOGIC CRYOSURGERY     LAPAROSCOPIC TUBAL LIGATION  03/04/2011   Procedure: LAPAROSCOPIC TUBAL LIGATION;  Surgeon: CHRISTELLA Elvie Pinal;  Location: [facility];  Service: Gynecology;  Laterality: Bilateral;  with filshie clips   TUBAL LIGATION  03/04/2011   Procedure: ESSURE TUBAL STERILIZATION;  Surgeon: CHRISTELLA Elvie Pinal;  Location: [facility];  Service: Gynecology;  Laterality: Bilateral;  Attempted   WISDOM TOOTH EXTRACTION     Social History:  reports that she quit smoking about 25 years ago. Her smoking use included cigarettes. She started smoking about 38 years ago. She has a 3.3 pack-year smoking history. She has never used smokeless tobacco. She reports current alcohol use. She reports that she does not use drugs. Family History:  Family History  Problem Relation Age of Onset   Hypertension Father    Hyperlipidemia Father    Colon polyps Father    Thyroid  disease Mother    Hypertension Sister    Hyperlipidemia Sister  Healthy Sister    Healthy Daughter    Healthy Son    Colon cancer Neg Hx    Esophageal cancer Neg Hx    Rectal cancer Neg Hx    Stomach cancer Neg Hx      HOME MEDICATIONS: Allergies as of 09/17/2023   No Known Allergies      Medication List        Accurate as of September 17, 2023 12:52 PM. If you have any questions, ask your nurse or doctor.          acetaminophen  325 MG tablet Commonly known as: TYLENOL  Take 650 mg by mouth every 6 (six) hours as needed for mild pain or headache.   BL EVENING PRIMROSE OIL PO Take 1 capsule by mouth 2 (two) times daily. Reported on 06/05/2015    cyclobenzaprine  5 MG tablet Commonly known as: FLEXERIL  Take 1 tablet (5 mg total) by mouth 3 (three) times daily as needed for muscle spasms.   estradiol  0.05 MG/24HR patch Commonly known as: VIVELLE -DOT Place 1 patch (0.05 mg total) onto the skin 2 (two) times a week.   ibuprofen  800 MG tablet Commonly known as: ADVIL  Take 1 tablet (800 mg total) by mouth every 8 (eight) hours as needed.   multivitamins ther. w/minerals Tabs tablet Take 1 tablet by mouth daily. Reported on 06/05/2015   NP Thyroid  15 MG tablet Generic drug: thyroid  Take 1 tablet (15 mg total) by mouth daily, expect take 2 tablets on Sundays.   progesterone  200 MG capsule Commonly known as: PROMETRIUM  Take 1 capsule (200 mg total) by mouth at bedtime.   Vitamin B 12 100 MCG Lozg Take by mouth.   zolpidem  5 MG tablet Commonly known as: AMBIEN  Take 0.5-1 tablets (2.5-5 mg total) by mouth at bedtime as needed for insomnia.          OBJECTIVE:   PHYSICAL EXAM: VS: There were no vitals taken for this visit.   EXAM: General: Pt appears well and is in NAD  Neck: General: Supple without adenopathy. Thyroid :  No goiter or nodules appreciated.   Lungs: Clear with good BS bilat  Heart: Auscultation: RRR.  Abdomen: soft, nontender  Extremities:  BL LE: No pretibial edema   Mental Status: Judgment, insight: Intact Orientation: Oriented to time, place, and person Mood and affect: No depression, anxiety, or agitation     DATA REVIEWED:   Latest Reference Range & Units 03/07/23 09:10  TSH 0.450 - 4.500 uIU/mL 29.400 (H)  T4,Free(Direct) 0.82 - 1.77 ng/dL 9.31 (L)    ASSESSMENT / PLAN / RECOMMENDATIONS:   Hypothyroidism:  -Intolerant to levothyroxine  or Synthroid  -Patient is clinically hypothyroid -Patient interested in desiccated hormones - We discussed armour thyroid , has non-physiologic levels of T4:T3  -I cautioned the patient against palpitations, if she develops any side effects to Armour  Thyroid  50 mg, she may opt to start at half a tablet until she builds tolerance -We did discuss the importance of LT-for replacement and the risk of myxedema coma - No local neck symptoms  -Repeat labs in 2 months   Medications  thyroid  15 mg daily  F/U in 4 months Labs in 8 weeks   Signed electronically by: Stefano Redgie Butts, MD  [facility]  [facility] [address] [place] [place], [place] [address] Phone: [phone] FAX: [phone]      CC: Hanford Mina BRAVO, NP [address] [place] [address] Phone: [phone]  Fax: [phone]  Return to Endocrinology clinic as below: Future Appointments  Date Time Provider Department Center  09/17/2023  3:00 PM Micole Delehanty, Donell Cardinal, MD [facility] None  10/28/2023  2:30 PM McGreal, Inocente HERO, MD [facility]  03/15/2024  8:35 AM Cleotilde Ronal RAMAN, MD [facility]

## 2023-09-18 ENCOUNTER — Other Ambulatory Visit (HOSPITAL_BASED_OUTPATIENT_CLINIC_OR_DEPARTMENT_OTHER): Payer: Self-pay

## 2023-09-18 ENCOUNTER — Other Ambulatory Visit: Payer: Self-pay

## 2023-09-18 MED ORDER — THYROID 15 MG PO TABS
15.0000 mg | ORAL_TABLET | ORAL | 3 refills | Status: DC
  Filled 2023-09-18 – 2023-09-30 (×2): qty 104, 90d supply, fill #0
  Filled 2024-02-06: qty 104, 90d supply, fill #1

## 2023-09-29 ENCOUNTER — Other Ambulatory Visit (HOSPITAL_BASED_OUTPATIENT_CLINIC_OR_DEPARTMENT_OTHER): Payer: Self-pay

## 2023-09-30 ENCOUNTER — Other Ambulatory Visit (HOSPITAL_BASED_OUTPATIENT_CLINIC_OR_DEPARTMENT_OTHER): Payer: Self-pay

## 2023-10-03 ENCOUNTER — Other Ambulatory Visit (HOSPITAL_BASED_OUTPATIENT_CLINIC_OR_DEPARTMENT_OTHER): Payer: Self-pay

## 2023-10-03 ENCOUNTER — Other Ambulatory Visit: Payer: Self-pay

## 2023-10-07 ENCOUNTER — Other Ambulatory Visit: Payer: Self-pay | Admitting: Obstetrics & Gynecology

## 2023-10-07 DIAGNOSIS — Z1231 Encounter for screening mammogram for malignant neoplasm of breast: Secondary | ICD-10-CM

## 2023-10-27 NOTE — Progress Notes (Signed)
 [facility] Initial Consultation   Referring Provider Hanford, Heather  E, NP [address] [place],  [place] [address]  Primary Care Provider Boscia, Heather  E, NP  Patient Profile: Angela Wells is a 53 y.o. female who is seen in consultation in the [facility] at the request of Dr. Boscia for evaluation and management of the problem(s) noted below.  Problem List: Dysphagia Elevated liver enzymes Family history of colon polyps   History of Present Illness     Discussed the use of AI scribe software for clinical note transcription with the patient, who gave verbal consent to proceed.  History of Present Illness Angela Wells is a 53 year old female with Hashimoto's thyroiditis dyslipidemia, prediabetes, arthritis, depression.who presents with swallowing difficulties and to discuss colorectal cancer screening  Dysphagia  - Swallowing difficulties present for two years, particularly with foods such as rice, shredded chicken, and hearty salads - Sensation of food getting stuck at the mid-sternum, associated with chest pain that sometimes radiates across the chest - At times has tried to swallow liquids in order to encourage passage of the food bolus - Liquids worsen symptoms and occasionally lead to vomiting - Episodes are intermittent but increasing in frequency - Soft foods and liquids do not cause issues - Able to swallow pills and medication without difficulty - Denies odynophagia, GERD, heartburn, pyrosis - Has not noted any neck masses or lesions - History of Hashimoto's thyroiditis-no history of goiter - No history of rheumatologic disorders such as scleroderma -no significant changes to skin, hair or nails  Colorectal cancer screening - Reports family history of colon polyps in her father and sister - No family history of colorectal cancer - States she has been on on a 5-year screening interval because of her family history - Last  colonoscopy 2020-normal - Has had mild symptoms of constipation and sensation of incomplete evacuation of stools - No rectal bleeding  History of elevated LFTs - Previously evaluated by Dr. Eda for elevated LFTs - Laboratory studies negative for HBV, HCV, hemochromatosis, autoimmune hepatitis, insulin  resistance - Previous ultrasound normal - Notes from 2020 suggest elevated ALT could be related to herbal therapies or thyroid  dysfunction; consideration of future celiac testing - LFTs were normal 03/07/2023 - No family history of liver disease    GI Review of Symptoms Significant for intermittent mild constipation and incomplete evacuation of stool, dysphagia. Otherwise negative.  General Review of Systems  Review of systems is significant for the pertinent positives and negatives as listed per the HPI.  Full ROS is otherwise negative.  Past Medical History   Past Medical History:  Diagnosis Date   Abnormal Pap smear    Anxiety    Arthritis    hands   Depression    Dyslipidemia with elevated low density lipoprotein (LDL) cholesterol and abnormally low high density lipoprotein cholesterol    diet controlled   Eczema    High cholesterol    Hypertension    resolved - lost weight, no med   Hyperthyroidism    SVD (spontaneous vaginal delivery)    x 2     Past Surgical History   Past Surgical History:  Procedure Laterality Date   DILATATION & CURETTAGE/HYSTEROSCOPY WITH MYOSURE N/A 06/16/2015   Procedure: DILATATION & CURETTAGE/HYSTEROSCOPY WITH Novasure;  Surgeon: Ronal GORMAN Pinal, MD;  Location: [facility];  Service: Gynecology;  Laterality: N/A;   GYNECOLOGIC CRYOSURGERY     LAPAROSCOPIC TUBAL LIGATION  03/04/2011   Procedure: LAPAROSCOPIC  TUBAL LIGATION;  Surgeon: CHRISTELLA Elvie Pinal;  Location: [facility];  Service: Gynecology;  Laterality: Bilateral;  with filshie clips   TUBAL LIGATION  03/04/2011   Procedure: ESSURE TUBAL STERILIZATION;  Surgeon: CHRISTELLA Elvie Pinal;  Location:  [facility];  Service: Gynecology;  Laterality: Bilateral;  Attempted   WISDOM TOOTH EXTRACTION       Allergies and Medications   No Known Allergies   Current Meds  Medication Sig   acetaminophen  (TYLENOL ) 325 MG tablet Take 650 mg by mouth every 6 (six) hours as needed for mild pain or headache.   BL EVENING PRIMROSE OIL PO Take 1 capsule by mouth 2 (two) times daily. Reported on 06/05/2015   Cyanocobalamin (VITAMIN B 12) 100 MCG LOZG Take by mouth.   estradiol  (VIVELLE -DOT) 0.05 MG/24HR patch Place 1 patch (0.05 mg total) onto the skin 2 (two) times a week.   ibuprofen  (ADVIL ) 800 MG tablet Take 1 tablet (800 mg total) by mouth every 8 (eight) hours as needed.   Multiple Vitamins-Minerals (MULTIVITAMINS THER. W/MINERALS) TABS Take 1 tablet by mouth daily. Reported on 06/05/2015   Na Sulfate-K Sulfate-Mg Sulfate concentrate (SUPREP) 17.5-3.13-1.6 GM/177ML SOLN Take 1 kit (354 mLs total) by mouth once for 1 dose.   progesterone  (PROMETRIUM ) 200 MG capsule Take 1 capsule (200 mg total) by mouth at bedtime.   thyroid  (NP THYROID ) 15 MG tablet Take 1 tablet (15 mg total) by mouth daily as directed for 6 days of the week. Take two tablets on Sundays.   zolpidem  (AMBIEN ) 5 MG tablet Take 0.5-1 tablets (2.5-5 mg total) by mouth at bedtime as needed for insomnia.     Family History   Family History  Problem Relation Age of Onset   Hypertension Father    Hyperlipidemia Father    Colon polyps Father    Thyroid  disease Mother    Hypertension Sister    Hyperlipidemia Sister    Healthy Sister    Healthy Daughter    Healthy Son    Colon cancer Neg Hx    Esophageal cancer Neg Hx    Rectal cancer Neg Hx    Stomach cancer Neg Hx      Social History   Social History   Tobacco Use   Smoking status: Former    Current packs/day: 0.00    Average packs/day: 0.3 packs/day for 13.0 years (3.3 ttl pk-yrs)    Types: Cigarettes    Start date: 03/18/1985    Quit date: 03/18/1998    Years since  quitting: 25.6   Smokeless tobacco: Never  Vaping Use   Vaping status: Never Used  Substance Use Topics   Alcohol use: Yes    Alcohol/week: 0.0 - 1.0 standard drinks of alcohol    Comment: rarely   Drug use: No   Angela Wells reports that she quit smoking about 25 years ago. Her smoking use included cigarettes. She started smoking about 38 years ago. She has a 3.3 pack-year smoking history. She has never used smokeless tobacco. She reports current alcohol use. She reports that she does not use drugs.  Vital Signs and Physical Examination   Vitals:   10/28/23 1433  BP: 112/64  Pulse: 64   Body mass index is 27.88 kg/m. Weight: 178 lb (80.7 kg)  General: Well developed, well nourished, no acute distress Head: Normocephalic and atraumatic, no palpable neck masses, no abnormal thyroid  Eyes: Sclerae anicteric, EOMI Lungs: Clear throughout to auscultation Heart: Regular rate and rhythm; No murmurs, rubs or bruits  Abdomen: Soft, non tender and non distended. No masses, hepatosplenomegaly or hernias noted. Normal Bowel sounds Rectal: Deferred Musculoskeletal: Symmetrical with no gross deformities    Review of Data  The following data was reviewed at the time of this encounter:  Laboratory Studies      Latest Ref Rng & Units 02/01/2021    3:26 PM 12/20/2018    1:42 PM 12/20/2018    1:05 PM  CBC  WBC 3.4 - 10.8 x10E3/uL 8.3   10.9   Hemoglobin 11.1 - 15.9 g/dL 85.6  86.3  86.0   Hematocrit 34.0 - 46.6 % 43.5  40.0  40.5   Platelets 150 - 450 x10E3/uL 309   254     No results found for: LIPASE    Latest Ref Rng & Units 03/07/2023    9:10 AM 02/01/2021    3:26 PM 07/21/2020    9:22 AM  CMP  Glucose 70 - 99 mg/dL 70  92    BUN 6 - 24 mg/dL 13  13    Creatinine 9.42 - 1.00 mg/dL 9.16  9.14    Sodium 865 - 144 mmol/L 143  143    Potassium 3.5 - 5.2 mmol/L 4.4  4.3    Chloride 96 - 106 mmol/L 104  100    CO2 20 - 29 mmol/L 24  26    Calcium  8.7 - 10.2 mg/dL 9.5  9.8    Total  Protein 6.0 - 8.5 g/dL 6.9  7.5  7.2   Total Bilirubin 0.0 - 1.2 mg/dL 0.4  0.3  0.4   Alkaline Phos 44 - 121 IU/L 55  58  53   AST 0 - 40 IU/L 19  31  43   ALT 0 - 32 IU/L 18  29  63    Lab Results  Component Value Date   TSH 7.16 (H) 09/17/2023   THYROIDAB 885 (H) 10/08/2018   Per documentation in clinic note 11/09/2022  Hepatitis B and hepatitis C serologies negative based upon blood donation earlier this year  Labs for hemochromatosis, autoimmune hepatitis, insulin  resistance negative  Imaging Studies  CTAP 12/20/2018 1. No signs of significant acute traumatic injury to the abdomen or pelvis. 2. 1.4 x 0.9 cm lesion in segment 3 of the liver with imaging characteristics compatible with a small cavernous hemangioma. 3. Aortic atherosclerosis.   GI Procedures and Studies  Colonoscopy 03/04/2019 Normal colon and TI  Clinical Impression  It is my clinical impression that Ms. Spade is a 53 y.o. female with;  Dysphagia Elevated liver enzymes Family history of colon polyps  Angela Wells presents to the office today for evaluation and management of dysphagia, family history of colon polyps and elevated liver enzymes.  She has had a 2-year history of intermittent solid food dysphagia that has progressed over time.  No dyne aphasia or other red flag symptoms.  Reviewed that the differential diagnosis could include GERD, esophagitis, esophageal web/ring/stricture, eosinophilic esophagitis, esophageal motility disorder, extrinsic compression of the esophagus.  Gastrointestinal malignancy is in the differential diagnosis but less likely based upon the duration and absence of untoward consequences.  For further evaluation I have recommended performing an upper endoscopy.  Angela Wells reports a family history of colon polyps in her father and sister.  She has been advised to have colonoscopies every 5 years in the past.  Discussed whether or not her family members had advanced adenomas -she is not sure  about this aspect of the history.  Advised that she  could proceed to colonoscopy this year in conjunction with her EGD.  If she is able to obtain any further information about her family history and there is no evidence of advanced adenomas may be able to space out colonoscopy screening intervals.  She has had a history of elevated ALT in the past.  Previous workup has excluded HBV, HCV, hemochromatosis, autoimmune hepatitis and insulin  resistance.  There was suspicion that her elevated ALT in the past could have been related to drug-induced liver injury secondary to herbal therapies or recent thyroid  dysfunction.  Currently liver enzymes are normal.  If they are elevated again in the future can perform updated abdominal ultrasound.  Plan  Schedule EGD +/- dilation  and colonoscopy at [facility]; biopsy for celiac disease given previous history of elevated ALT Obtain additional family history regarding the type of polyps that have been identified and family members of possible Continue to monitor liver enzymes every 6 to 12 months If liver enzymes elevate again in the future consider updated right upper quadrant ultrasound.  Planned Follow Up 3-4 months  The patient or caregiver verbalized understanding of the material covered, with no barriers to understanding. All questions were answered. Patient or caregiver is agreeable with the plan outlined above.    It was a pleasure to see Angela Wells.  If you have any questions or concerns regarding this evaluation, do not hesitate to contact me.  Inocente Hausen, MD [facility]

## 2023-10-28 ENCOUNTER — Ambulatory Visit (INDEPENDENT_AMBULATORY_CARE_PROVIDER_SITE_OTHER): Admitting: Pediatrics

## 2023-10-28 ENCOUNTER — Other Ambulatory Visit (HOSPITAL_BASED_OUTPATIENT_CLINIC_OR_DEPARTMENT_OTHER): Payer: Self-pay

## 2023-10-28 ENCOUNTER — Encounter: Payer: Self-pay | Admitting: Pediatrics

## 2023-10-28 VITALS — BP 112/64 | HR 64 | Ht 67.0 in | Wt 178.0 lb

## 2023-10-28 DIAGNOSIS — Z83719 Family history of colon polyps, unspecified: Secondary | ICD-10-CM

## 2023-10-28 DIAGNOSIS — R1310 Dysphagia, unspecified: Secondary | ICD-10-CM

## 2023-10-28 DIAGNOSIS — R7989 Other specified abnormal findings of blood chemistry: Secondary | ICD-10-CM | POA: Diagnosis not present

## 2023-10-28 DIAGNOSIS — Z1211 Encounter for screening for malignant neoplasm of colon: Secondary | ICD-10-CM

## 2023-10-28 MED ORDER — NA SULFATE-K SULFATE-MG SULF 17.5-3.13-1.6 GM/177ML PO SOLN
1.0000 | Freq: Once | ORAL | 0 refills | Status: AC
  Filled 2023-10-28: qty 354, 1d supply, fill #0

## 2023-10-28 NOTE — Patient Instructions (Signed)
 You have been scheduled for an endoscopy and colonoscopy. Please follow the written instructions given to you at your visit today.  If you use inhalers (even only as needed), please bring them with you on the day of your procedure.  DO NOT TAKE 7 DAYS PRIOR TO TEST- Trulicity (dulaglutide) Ozempic, Wegovy (semaglutide) Mounjaro (tirzepatide) Bydureon Bcise (exanatide extended release)  DO NOT TAKE 1 DAY PRIOR TO YOUR TEST Rybelsus (semaglutide) Adlyxin (lixisenatide) Victoza (liraglutide) Byetta (exanatide) ___________________________________________________________________________  Follow up in 3-4 months.  Thank you for entrusting me with your care and for choosing [facility], Dr. Inocente Hausen   _______________________________________________________  If your blood pressure at your visit was 140/90 or greater, please contact your primary care physician to follow up on this.  _______________________________________________________  If you are age 24 or older, your body mass index should be between 23-30. Your Body mass index is 27.88 kg/m. If this is out of the aforementioned range listed, please consider follow up with your Primary Care Provider.  If you are age 38 or younger, your body mass index should be between 19-25. Your Body mass index is 27.88 kg/m. If this is out of the aformentioned range listed, please consider follow up with your Primary Care Provider.   ________________________________________________________  The [facility] providers would like to encourage you to use MYCHART to communicate with providers for non-urgent requests or questions.  Due to long hold times on the telephone, sending your provider a message by [facility] may be a faster and more efficient way to get a response.  Please allow 48 business hours for a response.  Please remember that this is for non-urgent requests.  _______________________________________________________  [facility] is using a team-based approach to care.  Your team is made up of your doctor and two to three APPS. Our APPS (Nurse Practitioners and Physician Assistants) work with your physician to ensure care continuity for you. They are fully qualified to address your health concerns and develop a treatment plan. They communicate directly with your gastroenterologist to care for you. Seeing the Advanced Practice Practitioners on your physician's team can help you by facilitating care more promptly, often allowing for earlier appointments, access to diagnostic testing, procedures, and other specialty referrals.

## 2023-10-29 ENCOUNTER — Other Ambulatory Visit (HOSPITAL_BASED_OUTPATIENT_CLINIC_OR_DEPARTMENT_OTHER): Payer: Self-pay | Admitting: Obstetrics & Gynecology

## 2023-10-29 DIAGNOSIS — G4709 Other insomnia: Secondary | ICD-10-CM

## 2023-10-30 NOTE — Telephone Encounter (Signed)
 Medication refill request: Ambien  5 mg tablets take 0.5-1 tablets (2.5-5 mg total) by mouth at bedtime as needed for insomnia. Last AEX:  03/07/2023 with Dr.Miller Next AEX: 03/15/2024 with Dr.Miller  Routing to Arland Roller, CNM to review refills request with Dr.Miller being out of town.

## 2023-10-31 ENCOUNTER — Other Ambulatory Visit (HOSPITAL_BASED_OUTPATIENT_CLINIC_OR_DEPARTMENT_OTHER): Payer: Self-pay

## 2023-10-31 MED ORDER — ZOLPIDEM TARTRATE 5 MG PO TABS
2.5000 mg | ORAL_TABLET | Freq: Every evening | ORAL | 0 refills | Status: DC | PRN
  Filled 2023-11-07: qty 30, 30d supply, fill #0

## 2023-11-07 ENCOUNTER — Other Ambulatory Visit: Payer: Self-pay

## 2023-11-07 ENCOUNTER — Other Ambulatory Visit (HOSPITAL_BASED_OUTPATIENT_CLINIC_OR_DEPARTMENT_OTHER): Payer: Self-pay

## 2023-11-18 ENCOUNTER — Ambulatory Visit
Admission: RE | Admit: 2023-11-18 | Discharge: 2023-11-18 | Disposition: A | Discharge status: Home / Self Care | Source: Ambulatory Visit

## 2023-11-18 DIAGNOSIS — Z1231 Encounter for screening mammogram for malignant neoplasm of breast: Secondary | ICD-10-CM

## 2023-11-21 ENCOUNTER — Other Ambulatory Visit (HOSPITAL_BASED_OUTPATIENT_CLINIC_OR_DEPARTMENT_OTHER): Payer: Self-pay

## 2023-11-21 ENCOUNTER — Other Ambulatory Visit (HOSPITAL_BASED_OUTPATIENT_CLINIC_OR_DEPARTMENT_OTHER): Payer: Self-pay | Admitting: Obstetrics & Gynecology

## 2023-11-27 ENCOUNTER — Other Ambulatory Visit (HOSPITAL_BASED_OUTPATIENT_CLINIC_OR_DEPARTMENT_OTHER): Payer: Self-pay

## 2023-11-27 ENCOUNTER — Other Ambulatory Visit (HOSPITAL_BASED_OUTPATIENT_CLINIC_OR_DEPARTMENT_OTHER): Payer: Self-pay | Admitting: Obstetrics & Gynecology

## 2023-12-03 ENCOUNTER — Other Ambulatory Visit (HOSPITAL_BASED_OUTPATIENT_CLINIC_OR_DEPARTMENT_OTHER): Payer: Self-pay

## 2023-12-03 MED ORDER — PROGESTERONE 200 MG PO CAPS
200.0000 mg | ORAL_CAPSULE | Freq: Every evening | ORAL | 0 refills | Status: DC
  Filled 2023-12-03: qty 90, 90d supply, fill #0

## 2023-12-11 ENCOUNTER — Encounter: Payer: Self-pay | Admitting: Pediatrics

## 2023-12-14 NOTE — Progress Notes (Unsigned)
 [facility] History and Physical   Primary Care Physician:  Hanford, Heather  E, NP   Reason for Procedure:  Dysphagia, colorectal cancer screening, family history of colon polyps  Plan:    Upper endoscopy and colonoscopy     HPI: Angela Wells is a 53 y.o. female undergoing upper endoscopy for investigation of dysphagia and colonoscopy for colorectal cancer screening.  Patient was recently seen in clinic endorsing swallowing difficulties for 2 years, particularly with foods such as rice, shredded chicken and salads.  Reported sensation of food getting stuck in mid sternum associated with chest pain sometimes radiating across the chest.  Liquids sometimes worsen symptoms leading to vomiting.  No GERD symptoms.  Last colonoscopy performed in 2020 was normal.  She reports a family history of colon polyps in father and sister that have relegated her to a 5-year interval colonoscopy schedule.  Denies change in bowel habits or rectal bleeding at the time of this exam.   Past Medical History:  Diagnosis Date   Abnormal Pap smear    Anxiety    Arthritis    hands   Depression    Dyslipidemia with elevated low density lipoprotein (LDL) cholesterol and abnormally low high density lipoprotein cholesterol    diet controlled   Eczema    High cholesterol    Hypertension    resolved - lost weight, no med   Hyperthyroidism    SVD (spontaneous vaginal delivery)    x 2    Past Surgical History:  Procedure Laterality Date   DILATATION & CURETTAGE/HYSTEROSCOPY WITH MYOSURE N/A 06/16/2015   Procedure: DILATATION & CURETTAGE/HYSTEROSCOPY WITH Novasure;  Surgeon: Ronal GORMAN Pinal, MD;  Location: [facility];  Service: Gynecology;  Laterality: N/A;   GYNECOLOGIC CRYOSURGERY     LAPAROSCOPIC TUBAL LIGATION  03/04/2011   Procedure: LAPAROSCOPIC TUBAL LIGATION;  Surgeon: CHRISTELLA Elvie Pinal;  Location: [facility];  Service: Gynecology;  Laterality: Bilateral;  with filshie clips   TUBAL LIGATION   03/04/2011   Procedure: ESSURE TUBAL STERILIZATION;  Surgeon: CHRISTELLA Elvie Pinal;  Location: [facility];  Service: Gynecology;  Laterality: Bilateral;  Attempted   WISDOM TOOTH EXTRACTION      Prior to Admission medications   Medication Sig Start Date End Date Taking? Authorizing Provider  acetaminophen  (TYLENOL ) 325 MG tablet Take 650 mg by mouth every 6 (six) hours as needed for mild pain or headache.    [provider]  BL EVENING PRIMROSE OIL PO Take 1 capsule by mouth 2 (two) times daily. Reported on 06/05/2015    [provider]  Cyanocobalamin (VITAMIN B 12) 100 MCG LOZG Take by mouth.    [provider]  cyclobenzaprine  (FLEXERIL ) 5 MG tablet Take 1 tablet (5 mg total) by mouth 3 (three) times daily as needed for muscle spasms. Patient not taking: Reported on 10/28/2023 03/07/23   Pinal Ronal GORMAN, MD  estradiol  (VIVELLE -DOT) 0.05 MG/24HR patch Place 1 patch (0.05 mg total) onto the skin 2 (two) times a week. 05/01/23   Pinal Ronal GORMAN, MD  ibuprofen  (ADVIL ) 800 MG tablet Take 1 tablet (800 mg total) by mouth every 8 (eight) hours as needed. 03/07/23   Pinal Ronal GORMAN, MD  Multiple Vitamins-Minerals (MULTIVITAMINS THER. W/MINERALS) TABS Take 1 tablet by mouth daily. Reported on 06/05/2015    [provider]  progesterone  (PROMETRIUM ) 200 MG capsule Take 1 capsule (200 mg total) by mouth at bedtime. 12/03/23   Pinal Ronal GORMAN, MD  thyroid  (NP THYROID ) 15 MG tablet Take  1 tablet (15 mg total) by mouth daily as directed for 6 days of the week. Take two tablets on Sundays. 09/18/23   Shamleffer, Ibtehal Jaralla, MD  zolpidem  (AMBIEN ) 5 MG tablet Take 0.5-1 tablets (2.5-5 mg total) by mouth at bedtime as needed for insomnia. 10/31/23 01/29/24  Tad Arland POUR, CNM    Current Outpatient Medications  Medication Sig Dispense Refill   acetaminophen  (TYLENOL ) 325 MG tablet Take 650 mg by mouth every 6 (six) hours as needed for mild pain or headache.     BL EVENING PRIMROSE OIL PO  Take 1 capsule by mouth 2 (two) times daily. Reported on 06/05/2015     Cyanocobalamin (VITAMIN B 12) 100 MCG LOZG Take by mouth.     estradiol  (VIVELLE -DOT) 0.05 MG/24HR patch Place 1 patch (0.05 mg total) onto the skin 2 (two) times a week. 24 patch 3   ibuprofen  (ADVIL ) 800 MG tablet Take 1 tablet (800 mg total) by mouth every 8 (eight) hours as needed. 30 tablet 0   Multiple Vitamins-Minerals (MULTIVITAMINS THER. W/MINERALS) TABS Take 1 tablet by mouth daily. Reported on 06/05/2015     progesterone  (PROMETRIUM ) 200 MG capsule Take 1 capsule (200 mg total) by mouth at bedtime. 90 capsule 0   Pseudoephedrine-APAP-DM (DAYQUIL PO) Take 2 capsules by mouth every 6 (six) hours as needed (as needed for sinus symptoms).     thyroid  (NP THYROID ) 15 MG tablet Take 1 tablet (15 mg total) by mouth daily as directed for 6 days of the week. Take two tablets on Sundays. 104 tablet 3   cyclobenzaprine  (FLEXERIL ) 5 MG tablet Take 1 tablet (5 mg total) by mouth 3 (three) times daily as needed for muscle spasms. (Patient not taking: No sig reported) 30 tablet 0   zolpidem  (AMBIEN ) 5 MG tablet Take 0.5-1 tablets (2.5-5 mg total) by mouth at bedtime as needed for insomnia. 30 tablet 0   Current Facility-Administered Medications  Medication Dose Route Frequency Provider Last Rate Last Admin   0.9 %  sodium chloride  infusion  500 mL Intravenous Once Grete Bosko, Inocente HERO, MD        Allergies as of 12/19/2023   (No Known Allergies)    Family History  Problem Relation Age of Onset   Hypertension Father    Hyperlipidemia Father    Colon polyps Father    Thyroid  disease Mother    Hypertension Sister    Hyperlipidemia Sister    Healthy Sister    Healthy Daughter    Healthy Son    Colon cancer Neg Hx    Esophageal cancer Neg Hx    Rectal cancer Neg Hx    Stomach cancer Neg Hx     Social History   Socioeconomic History   Marital status: Married    Spouse name: Not on file   Number of children: 2   Years of  education: Not on file   Highest education level: Not on file  Occupational History   Occupation: Diplomatic Services operational officer  Tobacco Use   Smoking status: Former    Current packs/day: 0.00    Average packs/day: 0.3 packs/day for 13.0 years (3.3 ttl pk-yrs)    Types: Cigarettes    Start date: 03/18/1985    Quit date: 03/18/1998    Years since quitting: 25.7   Smokeless tobacco: Never  Vaping Use   Vaping status: Never Used  Substance and Sexual Activity   Alcohol use: Yes    Alcohol/week: 0.0 - 1.0 standard drinks of alcohol  Comment: rarely   Drug use: No   Sexual activity: Yes    Partners: Male    Birth control/protection: Surgical    Comment: BTL/Ablation  Other Topics Concern   Not on file  Social History Narrative   Not on file   Social Drivers of Health   Financial Resource Strain: Not on file  Food Insecurity: Not on file  Transportation Needs: Not on file  Physical Activity: Not on file  Stress: Not on file  Social Connections: Not on file  Intimate Partner Violence: Not on file    Review of Systems:  All other review of systems negative except as mentioned in the HPI.  Physical Exam: Vital signs BP 125/69   Pulse (!) 55   Temp 97.9 F (36.6 C)   Ht 5' 7 (1.702 m)   Wt 178 lb (80.7 kg)   SpO2 97%   BMI 27.88 kg/m   General:   Alert,  Well-developed, well-nourished, pleasant and cooperative in NAD Airway:  Mallampati 2 Lungs:  Clear throughout to auscultation.   Heart:  Regular rate and rhythm; no murmurs, clicks, rubs,  or gallops. Abdomen:  Soft, nontender and nondistended. Normal bowel sounds.   Neuro/Psych:  Normal mood and affect. A and O x 3  Inocente Hausen, MD [facility]

## 2023-12-19 ENCOUNTER — Ambulatory Visit (AMBULATORY_SURGERY_CENTER): Admitting: Pediatrics

## 2023-12-19 ENCOUNTER — Encounter: Payer: Self-pay | Admitting: Pediatrics

## 2023-12-19 VITALS — BP 130/77 | HR 44 | Temp 97.9°F | Resp 12 | Ht 67.0 in | Wt 178.0 lb

## 2023-12-19 DIAGNOSIS — R7401 Elevation of levels of liver transaminase levels: Secondary | ICD-10-CM

## 2023-12-19 DIAGNOSIS — K222 Esophageal obstruction: Secondary | ICD-10-CM

## 2023-12-19 DIAGNOSIS — R1310 Dysphagia, unspecified: Secondary | ICD-10-CM

## 2023-12-19 DIAGNOSIS — K644 Residual hemorrhoidal skin tags: Secondary | ICD-10-CM | POA: Diagnosis not present

## 2023-12-19 DIAGNOSIS — Z1211 Encounter for screening for malignant neoplasm of colon: Secondary | ICD-10-CM | POA: Diagnosis not present

## 2023-12-19 DIAGNOSIS — E7800 Pure hypercholesterolemia, unspecified: Secondary | ICD-10-CM | POA: Diagnosis not present

## 2023-12-19 DIAGNOSIS — K648 Other hemorrhoids: Secondary | ICD-10-CM

## 2023-12-19 DIAGNOSIS — F32A Depression, unspecified: Secondary | ICD-10-CM | POA: Diagnosis not present

## 2023-12-19 DIAGNOSIS — D128 Benign neoplasm of rectum: Secondary | ICD-10-CM | POA: Diagnosis not present

## 2023-12-19 DIAGNOSIS — K449 Diaphragmatic hernia without obstruction or gangrene: Secondary | ICD-10-CM | POA: Diagnosis not present

## 2023-12-19 DIAGNOSIS — Z83719 Family history of colon polyps, unspecified: Secondary | ICD-10-CM

## 2023-12-19 DIAGNOSIS — K621 Rectal polyp: Secondary | ICD-10-CM | POA: Diagnosis not present

## 2023-12-19 DIAGNOSIS — F419 Anxiety disorder, unspecified: Secondary | ICD-10-CM | POA: Diagnosis not present

## 2023-12-19 MED ORDER — SODIUM CHLORIDE 0.9 % IV SOLN: 500.0000 mL | Freq: Once | INTRAVENOUS | Status: DC

## 2023-12-19 NOTE — Patient Instructions (Signed)
 Start with soft foods today and advance as tolerated dut to dilatation of esophagus today  Await results of esophageal and duodenal biopsies done   Handouts on polyps,& hemorrhoids given to you today.   Await results of colon polyp removed   Await pathology results on polyps removed     YOU HAD AN ENDOSCOPIC PROCEDURE TODAY AT THE [facility]:   Refer to the procedure report that was given to you for any specific questions about what was found during the examination.  If the procedure report does not answer your questions, please call your gastroenterologist to clarify.  If you requested that your care partner not be given the details of your procedure findings, then the procedure report has been included in a sealed envelope for you to review at your convenience later.  YOU SHOULD EXPECT: Some feelings of bloating in the abdomen. Passage of more gas than usual.  Walking can help get rid of the air that was put into your GI tract during the procedure and reduce the bloating. If you had a lower endoscopy (such as a colonoscopy or flexible sigmoidoscopy) you may notice spotting of blood in your stool or on the toilet paper. If you underwent a bowel prep for your procedure, you may not have a normal bowel movement for a few days.  Please Note:  You might notice some irritation and congestion in your nose or some drainage.  This is from the oxygen used during your procedure.  There is no need for concern and it should clear up in a day or so.  SYMPTOMS TO REPORT IMMEDIATELY:  Following lower endoscopy (colonoscopy or flexible sigmoidoscopy):  Excessive amounts of blood in the stool  Significant tenderness or worsening of abdominal pains  Swelling of the abdomen that is new, acute  Fever of 100F or higher  Following upper endoscopy (EGD)  Vomiting of blood or coffee ground material  New chest pain or pain under the shoulder blades  Painful or persistently difficult  swallowing  New shortness of breath  Fever of 100F or higher  Black, tarry-looking stools  For urgent or emergent issues, a gastroenterologist can be reached at any hour by calling [phone]. Do not use MyChart messaging for urgent concerns.    DIET:  We do recommend a small meal at first, but then you may proceed to your regular diet.  Drink plenty of fluids but you should avoid alcoholic beverages for 24 hours.  ACTIVITY:  You should plan to take it easy for the rest of today and you should NOT DRIVE or use heavy machinery until tomorrow (because of the sedation medicines used during the test).    FOLLOW UP: Our staff will call the number listed on your records the next business day following your procedure.  We will call around 7:15- 8:00 am to check on you and address any questions or concerns that you may have regarding the information given to you following your procedure. If we do not reach you, we will leave a message.     If any biopsies were taken you will be contacted by phone or by letter within the next 1-3 weeks.  Please call us  at [phone] if you have not heard about the biopsies in 3 weeks.    SIGNATURES/CONFIDENTIALITY: You and/or your care partner have signed paperwork which will be entered into your electronic medical record.  These signatures attest to the fact that that the information above on your After  Visit Summary has been reviewed and is understood.  Full responsibility of the confidentiality of this discharge information lies with you and/or your care-partner.

## 2023-12-19 NOTE — Op Note (Signed)
 [facility] Patient Name: Theola Cuellar Procedure Date: 12/19/2023 10:34 AM MRN: [id number] Endoscopist: Inocente Hausen , MD, [id number] Age: 53 Referring MD:  Date of Birth: 1971-01-03 Gender: Female Account #: [id number] Procedure:                Colonoscopy Indications:              Colon cancer screening in patient at increased                            risk: Family history of colon polyps in multiple                            1st-degree relatives (Father, Sister), Last                            colonoscopy: December 2020 Medicines:                Monitored Anesthesia Care Procedure:                Pre-Anesthesia Assessment:                           - Prior to the procedure, a History and Physical                            was performed, and patient medications and                            allergies were reviewed. The patient's tolerance of                            previous anesthesia was also reviewed. The risks                            and benefits of the procedure and the sedation                            options and risks were discussed with the patient.                            All questions were answered, and informed consent                            was obtained. Prior Anticoagulants: The patient has                            taken no anticoagulant or antiplatelet agents. ASA                            Grade Assessment: II - A patient with mild systemic                            disease. After reviewing the risks and benefits,  the patient was deemed in satisfactory condition to                            undergo the procedure.                           After obtaining informed consent, the colonoscope                            was passed under direct vision. Throughout the                            procedure, the patient's blood pressure, pulse, and                            oxygen saturations were monitored  continuously. The                            Olympus Scope SN: [id number] was introduced through                            the anus and advanced to the terminal ileum. The                            colonoscopy was performed without difficulty. The                            patient tolerated the procedure well. The quality                            of the bowel preparation was good. The terminal                            ileum, ileocecal valve, appendiceal orifice, and                            rectum were photographed. Scope In: 11:03:28 AM Scope Out: 11:16:36 AM Scope Withdrawal Time: 0 hours 10 minutes 38 seconds  Total Procedure Duration: 0 hours 13 minutes 8 seconds  Findings:                 Hemorrhoids were found on perianal exam.                           The digital rectal exam was normal. Pertinent                            negatives include normal sphincter tone and no                            palpable rectal lesions.                           A 4 mm polyp was found in the rectum. The polyp was  sessile. The polyp was removed with a cold snare.                            Resection and retrieval were complete.                           Internal hemorrhoids were found during retroflexion. Complications:            No immediate complications. Estimated blood loss:                            Minimal. Estimated Blood Loss:     Estimated blood loss was minimal. Impression:               - Hemorrhoids found on perianal exam.                           - One 4 mm polyp in the rectum, removed with a cold                            snare. Resected and retrieved.                           - Internal hemorrhoids. Recommendation:           - Discharge patient to home (ambulatory).                           - Await pathology results.                           - Repeat colonoscopy for surveillance based on                            pathology results.                            - GI follow-up TBD pending biopsy results.                           - The findings and recommendations were discussed                            with the patient's family.                           - Patient has a contact number available for                            emergencies. The signs and symptoms of potential                            delayed complications were discussed with the                            patient. Return to normal activities tomorrow.  Written discharge instructions were provided to the                            patient. Inocente Hausen, MD 12/19/2023 11:25:29 AM This report has been signed electronically.

## 2023-12-19 NOTE — Progress Notes (Signed)
 Sedate, gd SR, tolerated procedure well, VSS, report to RN

## 2023-12-19 NOTE — Op Note (Signed)
 [facility] Patient Name: Angela Wells Procedure Date: 12/19/2023 10:38 AM MRN: [id number] Endoscopist: Inocente Hausen , MD, [id number] Age: 53 Referring MD:  Date of Birth: 08-Nov-1970 Gender: Female Account #: [id number] Procedure:                Upper GI endoscopy Indications:              Dysphagia, history of elevated ALT Medicines:                Monitored Anesthesia Care Procedure:                Pre-Anesthesia Assessment:                           - Prior to the procedure, a History and Physical                            was performed, and patient medications and                            allergies were reviewed. The patient's tolerance of                            previous anesthesia was also reviewed. The risks                            and benefits of the procedure and the sedation                            options and risks were discussed with the patient.                            All questions were answered, and informed consent                            was obtained. Prior Anticoagulants: The patient has                            taken no anticoagulant or antiplatelet agents. ASA                            Grade Assessment: II - A patient with mild systemic                            disease. After reviewing the risks and benefits,                            the patient was deemed in satisfactory condition to                            undergo the procedure.                           After obtaining informed consent, the endoscope was  passed under direct vision. Throughout the                            procedure, the patient's blood pressure, pulse, and                            oxygen saturations were monitored continuously. The                            Endoscope was introduced through the mouth, and                            advanced to the second part of duodenum. The upper                            GI endoscopy was  accomplished without difficulty.                            The patient tolerated the procedure well. Scope In: Scope Out: Findings:                 The examined esophagus was normal. Biopsies were                            obtained from the proximal and distal esophagus                            with cold forceps for histology for evaluation of                            possible eosinophilic esophagitis.                           A non-obstructing and mild Schatzki ring was found                            at the gastroesophageal junction. A guidewire was                            placed and the scope was withdrawn. Dilation was                            performed with a Savary dilator with no resistance                            at 16 mm and 17 mm.                           The gastric body, gastric antrum, cardia (on                            retroflexion) and gastric fundus (on retroflexion)  were normal.                           A small hiatal hernia was present.                           The duodenal bulb and second portion of the                            duodenum were normal. Biopsies for histology were                            taken with a cold forceps for evaluation of celiac                            disease. Complications:            No immediate complications. Estimated blood loss:                            Minimal. Estimated Blood Loss:     Estimated blood loss was minimal. Estimated blood                            loss was minimal. Impression:               - Normal esophagus.                           - Non-obstructing and mild Schatzki ring. Dilated                            to 17 mm.                           - Normal gastric body, antrum, cardia and gastric                            fundus.                           - Small hiatal hernia.                           - Normal duodenal bulb and second portion of the                             duodenum.                           - Biopsies were taken with a cold forceps for                            evaluation of eosinophilic esophagitis. Recommendation:           - Await pathology results.                           -  Perform a colonoscopy today. Inocente Hausen, MD 12/19/2023 11:20:35 AM This report has been signed electronically.

## 2023-12-19 NOTE — Progress Notes (Signed)
 Called to room to assist during endoscopic procedure.  Patient ID and intended procedure confirmed with present staff. Received instructions for my participation in the procedure from the performing physician.

## 2023-12-22 ENCOUNTER — Telehealth: Payer: Self-pay

## 2023-12-22 NOTE — Telephone Encounter (Signed)
 No answer after follow up call. Voice message left.

## 2023-12-23 ENCOUNTER — Ambulatory Visit: Payer: Self-pay | Admitting: Pediatrics

## 2023-12-23 LAB — SURGICAL PATHOLOGY

## 2024-01-14 DIAGNOSIS — H524 Presbyopia: Secondary | ICD-10-CM | POA: Diagnosis not present

## 2024-01-14 DIAGNOSIS — H52223 Regular astigmatism, bilateral: Secondary | ICD-10-CM | POA: Diagnosis not present

## 2024-01-14 DIAGNOSIS — H5213 Myopia, bilateral: Secondary | ICD-10-CM | POA: Diagnosis not present

## 2024-02-02 ENCOUNTER — Other Ambulatory Visit (HOSPITAL_BASED_OUTPATIENT_CLINIC_OR_DEPARTMENT_OTHER): Payer: Self-pay | Admitting: Certified Nurse Midwife

## 2024-02-02 DIAGNOSIS — G4709 Other insomnia: Secondary | ICD-10-CM

## 2024-02-05 MED ORDER — ZOLPIDEM TARTRATE 5 MG PO TABS
2.5000 mg | ORAL_TABLET | Freq: Every evening | ORAL | 0 refills | Status: AC | PRN
  Filled 2024-02-05: qty 30, 30d supply, fill #0

## 2024-02-06 ENCOUNTER — Other Ambulatory Visit (HOSPITAL_BASED_OUTPATIENT_CLINIC_OR_DEPARTMENT_OTHER): Payer: Self-pay

## 2024-02-06 ENCOUNTER — Other Ambulatory Visit: Payer: Self-pay

## 2024-02-28 ENCOUNTER — Other Ambulatory Visit (HOSPITAL_BASED_OUTPATIENT_CLINIC_OR_DEPARTMENT_OTHER): Payer: Self-pay

## 2024-03-15 ENCOUNTER — Ambulatory Visit (HOSPITAL_BASED_OUTPATIENT_CLINIC_OR_DEPARTMENT_OTHER): Payer: 59 | Admitting: Obstetrics & Gynecology

## 2024-03-15 ENCOUNTER — Other Ambulatory Visit (HOSPITAL_BASED_OUTPATIENT_CLINIC_OR_DEPARTMENT_OTHER): Payer: Self-pay

## 2024-03-15 ENCOUNTER — Other Ambulatory Visit: Payer: Self-pay

## 2024-03-15 ENCOUNTER — Encounter (HOSPITAL_BASED_OUTPATIENT_CLINIC_OR_DEPARTMENT_OTHER): Payer: Self-pay | Admitting: Obstetrics & Gynecology

## 2024-03-15 VITALS — BP 127/84 | HR 56 | Ht 67.0 in | Wt 190.0 lb

## 2024-03-15 DIAGNOSIS — Z1331 Encounter for screening for depression: Secondary | ICD-10-CM

## 2024-03-15 DIAGNOSIS — G4709 Other insomnia: Secondary | ICD-10-CM | POA: Diagnosis not present

## 2024-03-15 DIAGNOSIS — Z01419 Encounter for gynecological examination (general) (routine) without abnormal findings: Secondary | ICD-10-CM | POA: Diagnosis not present

## 2024-03-15 DIAGNOSIS — R7303 Prediabetes: Secondary | ICD-10-CM

## 2024-03-15 DIAGNOSIS — Z780 Asymptomatic menopausal state: Secondary | ICD-10-CM | POA: Diagnosis not present

## 2024-03-15 DIAGNOSIS — E7800 Pure hypercholesterolemia, unspecified: Secondary | ICD-10-CM | POA: Diagnosis not present

## 2024-03-15 DIAGNOSIS — Z79890 Hormone replacement therapy (postmenopausal): Secondary | ICD-10-CM

## 2024-03-15 LAB — CBC
Hematocrit: 42.3 % (ref 34.0–46.6)
Hemoglobin: 14.4 g/dL (ref 11.1–15.9)
MCH: 31.2 pg (ref 26.6–33.0)
MCHC: 34 g/dL (ref 31.5–35.7)
MCV: 92 fL (ref 79–97)
Platelets: 297 x10E3/uL (ref 150–450)
RBC: 4.61 x10E6/uL (ref 3.77–5.28)
RDW: 12.2 % (ref 11.7–15.4)
WBC: 6 x10E3/uL (ref 3.4–10.8)

## 2024-03-15 LAB — HEMOGLOBIN A1C
Est. average glucose Bld gHb Est-mCnc: 108 mg/dL
Hgb A1c MFr Bld: 5.4 % (ref 4.8–5.6)

## 2024-03-15 LAB — COMPREHENSIVE METABOLIC PANEL WITH GFR
ALT: 20 IU/L (ref 0–32)
AST: 24 IU/L (ref 0–40)
Albumin: 4.6 g/dL (ref 3.8–4.9)
Alkaline Phosphatase: 47 IU/L — ABNORMAL LOW (ref 49–135)
BUN/Creatinine Ratio: 15 (ref 9–23)
BUN: 13 mg/dL (ref 6–24)
Bilirubin Total: 0.4 mg/dL (ref 0.0–1.2)
CO2: 23 mmol/L (ref 20–29)
Calcium: 9.5 mg/dL (ref 8.7–10.2)
Chloride: 101 mmol/L (ref 96–106)
Creatinine, Ser: 0.85 mg/dL (ref 0.57–1.00)
Globulin, Total: 2.5 g/dL (ref 1.5–4.5)
Glucose: 88 mg/dL (ref 70–99)
Potassium: 4.8 mmol/L (ref 3.5–5.2)
Sodium: 138 mmol/L (ref 134–144)
Total Protein: 7.1 g/dL (ref 6.0–8.5)
eGFR: 81 mL/min/1.73

## 2024-03-15 LAB — LIPID PANEL
Chol/HDL Ratio: 4.4 ratio (ref 0.0–4.4)
Cholesterol, Total: 297 mg/dL — ABNORMAL HIGH (ref 100–199)
HDL: 68 mg/dL
LDL Chol Calc (NIH): 198 mg/dL — ABNORMAL HIGH (ref 0–99)
Triglycerides: 170 mg/dL — ABNORMAL HIGH (ref 0–149)
VLDL Cholesterol Cal: 31 mg/dL (ref 5–40)

## 2024-03-15 MED ORDER — NORETHINDRONE ACETATE 5 MG PO TABS
5.0000 mg | ORAL_TABLET | Freq: Every day | ORAL | 3 refills | Status: AC
  Filled 2024-03-15: qty 90, 90d supply, fill #0

## 2024-03-15 MED ORDER — ESTRADIOL 0.05 MG/24HR TD PTTW
1.0000 | MEDICATED_PATCH | TRANSDERMAL | 3 refills | Status: AC
  Filled 2024-03-15: qty 24, 84d supply, fill #0

## 2024-03-15 NOTE — Progress Notes (Signed)
 "  ANNUAL EXAM Patient name: Angela Wells MRN [id number]  Date of birth: July 14, 1970 Chief Complaint:   Gynecologic Exam  History of Present Illness:   Angela Wells is a 53 y.o. [phone] Caucasian female being seen today for a routine annual exam.  Doing well.  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  She is on HRT.  Using estradiol  0.05mg  patches.  Using prometrium  200mg  but takes about every other day because it gives her dreams.  Denies vaginal bleeding.    No LMP recorded. Patient has had an ablation.   Last pap: 03/07/2023. Results were: NILM w/ HRHPV negative. H/O abnormal pap: no Last mammogram: 11/18/2023. Results were: normal. Family h/o breast cancer: no Last colonoscopy: 12/19/2023. Results were: normal. Family h/o colorectal cancer: no     03/15/2024    8:38 AM 03/07/2023    8:29 AM 02/14/2022    8:16 AM 02/01/2021    2:23 PM 07/21/2020    8:49 AM  Depression screen PHQ 2/9  Decreased Interest 1 0 0 0 0  Down, Depressed, Hopeless 0 0 0 0 0  PHQ - 2 Score 1 0 0 0 0  Altered sleeping 2    0  Tired, decreased energy 2    0  Change in appetite 1    0  Feeling bad or failure about yourself  0    0  Trouble concentrating 0    0  Moving slowly or fidgety/restless 0    0  Suicidal thoughts 0    0  PHQ-9 Score 6    0      Data saved with a previous flowsheet row definition    Review of Systems:   Pertinent items are noted in HPI Denies any vaginal bleeding, pelvic pain, urinary or bowel changes. Pertinent History Reviewed:  Reviewed past medical,surgical, social and family history.  Reviewed problem list, medications and allergies. Physical Assessment:   Vitals:   03/15/24 0836  BP: 127/84  Pulse: (!) 56  SpO2: 99%  Weight: 190 lb (86.2 kg)  Height: 5' 7 (1.702 m)  Body mass index is 29.76 kg/m.        Physical Examination:   General appearance - well appearing, and in no distress  Mental status - alert, oriented to person, place, and  time  Psych:  She has a normal mood and affect  Skin - warm and dry, normal color, no suspicious lesions noted  Chest - effort normal, all lung fields clear to auscultation bilaterally  Heart - normal rate and regular rhythm  Neck:  midline trachea, no thyromegaly or nodules  Breasts - breasts appear normal, no suspicious masses, no skin or nipple changes or  axillary nodes  Abdomen - soft, nontender, nondistended, no masses or organomegaly  Pelvic - VULVA: normal appearing vulva with no masses, tenderness or lesions  VAGINA: normal appearing vagina with normal color and discharge, no lesions   CERVIX: normal appearing cervix without discharge or lesions, no CMT  Thin prep pap is not indicated  UTERUS: uterus is felt to be normal size, shape, consistency and nontender   ADNEXA: No adnexal masses or tenderness noted.  Rectal - normal rectal, good sphincter tone, no masses felt  Extremities:  No swelling or varicosities noted  Chaperone present for exam  No results found for this or any previous visit (from the past 24 hours).  Assessment & Plan:  1. Well woman exam with routine gynecological  exam (Primary) - Pap smear 2024 - Mammogram 11/21/2023 - Colonoscopy 12/19/2023 - Bone mineral density will be planned closer to age 63 - lab work ordered - vaccines reviewed/updated  2. Postmenopausal - RF for vivelle  dot 0.05mg  patches twice weekly - will switch from Prometrium  to norethindrone .  Advised pt to call if still has same sleep issues.  3. Pre-diabetes - CBC - Lipid panel  4. Elevated LDL cholesterol level - Comprehensive metabolic panel with GFR - Hemoglobin A1c  5. Other insomnia   Orders Placed This Encounter  Procedures   CBC   Comprehensive metabolic panel with GFR   Hemoglobin A1c   Lipid panel    Meds:  Meds ordered this encounter  Medications   estradiol  (VIVELLE -DOT) 0.05 MG/24HR patch    Sig: Place 1 patch (0.05 mg total) onto the skin 2 (two) times a  week.    Dispense:  24 patch    Refill:  3   norethindrone  (AYGESTIN ) 5 MG tablet    Sig: Take 1 tablet (5 mg total) by mouth daily.    Dispense:  90 tablet    Refill:  3    Follow-up: No follow-ups on file.  Ronal GORMAN Pinal, MD 03/15/2024 9:24 AM "

## 2024-03-19 ENCOUNTER — Ambulatory Visit (HOSPITAL_BASED_OUTPATIENT_CLINIC_OR_DEPARTMENT_OTHER): Payer: Self-pay | Admitting: Obstetrics & Gynecology

## 2024-03-23 ENCOUNTER — Encounter: Payer: Self-pay | Admitting: Internal Medicine

## 2024-03-23 ENCOUNTER — Ambulatory Visit: Admitting: Internal Medicine

## 2024-03-23 ENCOUNTER — Other Ambulatory Visit

## 2024-03-23 VITALS — BP 122/80 | HR 72 | Ht 67.0 in | Wt 191.0 lb

## 2024-03-23 DIAGNOSIS — E785 Hyperlipidemia, unspecified: Secondary | ICD-10-CM

## 2024-03-23 DIAGNOSIS — E039 Hypothyroidism, unspecified: Secondary | ICD-10-CM | POA: Diagnosis not present

## 2024-03-23 LAB — TSH: TSH: 7.72 m[IU]/L — ABNORMAL HIGH

## 2024-03-23 NOTE — Progress Notes (Unsigned)
 "     Name: Angela Wells  MRN/ DOB: [id number], September 28, 1970    Age/ Sex: 54 y.o., female     PCP: Hanford, Heather  E, NP   Reason for Endocrinology Evaluation: Subclinical hypothyroidism     Initial Endocrinology Clinic Visit: 10/08/2019    PATIENT IDENTIFIER: [name] is a 54 y.o., female with a past medical history of Dyslipidemia. She has followed with [facility] since 10/08/2018 for consultative assistance with management of her subclinical hypothyroisism.   HISTORICAL SUMMARY:  Pt noted to have an elevated TSH at 8.490 uIU/mL with normal FT4 during routine labs in 07/2018. Repeat labs a month later confirmed similar results.   Pt was started on LT-4 replacement at the time developed headache to levothyroxine  as well as she was feeling angry on it.  We started her on desiccated thyroid  hormone, after 4 months she was complaining of headaches and attributed that to the desiccated hormones but I did explain to the patient that I did not think her headaches are related after 4 months of desiccated hormonal use.   Mother with Hashimoto's   SUBJECTIVE:    Today (03/23/2024):  Angela Wells is here for a follow up on hypothyroidism.   She continues to follow-up with gynecology for HRT, she continues with estradiol  patch, Prometrium  was switched to norethindrone  as Prometrium  was causing dreams  Cholesterol remains high, she didn't feel well on statin and would like to focus on diet and exercise.  No local neck swelling  No palpitations  Has tremors due to caffeine  No change in BM No anxiety or jittery sensation  Energy has been low on the past 2 months   NP thyroid  15 mg 2 tabs on Sundays and 1 tab Monday through Saturday      HISTORY:  Past Medical History:  Past Medical History:  Diagnosis Date   Abnormal Pap smear    Anxiety    Arthritis    hands   Depression    Dyslipidemia with elevated low density lipoprotein (LDL) cholesterol and abnormally  low high density lipoprotein cholesterol    diet controlled   Eczema    High cholesterol    Hypertension    resolved - lost weight, no med   Hyperthyroidism    SVD (spontaneous vaginal delivery)    x 2   Past Surgical History:  Past Surgical History:  Procedure Laterality Date   DILATATION & CURETTAGE/HYSTEROSCOPY WITH MYOSURE N/A 06/16/2015   Procedure: DILATATION & CURETTAGE/HYSTEROSCOPY WITH Novasure;  Surgeon: Ronal GORMAN Pinal, MD;  Location: [facility];  Service: Gynecology;  Laterality: N/A;   GYNECOLOGIC CRYOSURGERY     LAPAROSCOPIC TUBAL LIGATION  03/04/2011   Procedure: LAPAROSCOPIC TUBAL LIGATION;  Surgeon: CHRISTELLA Elvie Pinal;  Location: [facility];  Service: Gynecology;  Laterality: Bilateral;  with filshie clips   TUBAL LIGATION  03/04/2011   Procedure: ESSURE TUBAL STERILIZATION;  Surgeon: CHRISTELLA Elvie Pinal;  Location: [facility];  Service: Gynecology;  Laterality: Bilateral;  Attempted   WISDOM TOOTH EXTRACTION     Social History:  reports that she quit smoking about 26 years ago. Her smoking use included cigarettes. She started smoking about 39 years ago. She has a 3.3 pack-year smoking history. She has never used smokeless tobacco. She reports current alcohol use. She reports that she does not use drugs. Family History:  Family History  Problem Relation Age of Onset   Hypertension Father    Hyperlipidemia Father    Colon polyps  Father    Thyroid  disease Mother    Hypertension Sister    Hyperlipidemia Sister    Healthy Sister    Healthy Daughter    Healthy Son    Colon cancer Neg Hx    Esophageal cancer Neg Hx    Rectal cancer Neg Hx    Stomach cancer Neg Hx      HOME MEDICATIONS: Allergies as of 03/23/2024   No Known Allergies      Medication List        Accurate as of March 23, 2024  8:01 AM. If you have any questions, ask your nurse or doctor.          acetaminophen  325 MG tablet Commonly known as: TYLENOL  Take 650 mg by mouth every 6 (six) hours as needed  for mild pain or headache.   BL EVENING PRIMROSE OIL PO Take 1 capsule by mouth 2 (two) times daily. Reported on 06/05/2015   cyclobenzaprine  5 MG tablet Commonly known as: FLEXERIL  Take 1 tablet (5 mg total) by mouth 3 (three) times daily as needed for muscle spasms.   estradiol  0.05 MG/24HR patch Commonly known as: VIVELLE -DOT Place 1 patch (0.05 mg total) onto the skin 2 (two) times a week.   ibuprofen  800 MG tablet Commonly known as: ADVIL  Take 1 tablet (800 mg total) by mouth every 8 (eight) hours as needed.   multivitamins ther. w/minerals Tabs tablet Take 1 tablet by mouth daily. Reported on 06/05/2015   norethindrone  5 MG tablet Commonly known as: AYGESTIN  Take 1 tablet (5 mg total) by mouth daily.   NP Thyroid  15 MG tablet Generic drug: thyroid  Take 1 tablet (15 mg total) by mouth daily as directed for 6 days of the week. Take two tablets on Sundays.   Vitamin B 12 100 MCG Lozg Take by mouth.   zolpidem  5 MG tablet Commonly known as: AMBIEN  Take 0.5-1 tablets (2.5-5 mg total) by mouth at bedtime as needed for insomnia.          OBJECTIVE:   PHYSICAL EXAM: VS: BP 122/80   Pulse 72   Ht 5' 7 (1.702 m)   Wt 191 lb (86.6 kg)   SpO2 97%   BMI 29.91 kg/m    EXAM: General: Pt appears well and is in NAD  Neck: General: Supple without adenopathy. Thyroid :  No goiter or nodules appreciated.   Lungs: Clear with good BS bilat  Heart: Auscultation: RRR.  Abdomen: soft, nontender  Extremities:  BL LE: No pretibial edema   Mental Status: Judgment, insight: Intact Orientation: Oriented to time, place, and person Mood and affect: No depression, anxiety, or agitation     DATA REVIEWED:   ASSESSMENT / PLAN / RECOMMENDATIONS:   Hypothyroidism:  -Intolerant to levothyroxine  or Synthroid  -Patient with low energy over the past 2 months - No local neck symptoms     Medications  Take NP thyroid  15 mg, 2 tablets on Sundays and 1 tablet rest of the  week  2. Dyslipidemia :  - LDL has been historically> 190 mg/dL  - Patient has no attending xanthomas or first-degree relative with early CVA/CAD - She was on a statin in the past but that did not make her feel well - Patient would like to focus on lifestyle changes.  I did encourage the patient to start as soon as possible, we did discuss that there are other lipid-lowering alternatives other than statins that would also provide cardiovascular benefits  Follow-up in 6 months  Signed electronically by: Stefano Redgie Butts, MD  [facility]  [facility] [address] [place], [place] [address] Phone: [phone] FAX: [phone]      CC: Hanford, Heather  E, NP [address] [place] [address] Phone: [phone]  Fax: [phone]   Return to Endocrinology clinic as below: No future appointments.    "

## 2024-03-24 ENCOUNTER — Other Ambulatory Visit (HOSPITAL_BASED_OUTPATIENT_CLINIC_OR_DEPARTMENT_OTHER): Payer: Self-pay

## 2024-03-24 ENCOUNTER — Ambulatory Visit: Payer: Self-pay | Admitting: Internal Medicine

## 2024-03-24 MED ORDER — THYROID 30 MG PO TABS
30.0000 mg | ORAL_TABLET | Freq: Every day | ORAL | 2 refills | Status: AC
  Filled 2024-03-24: qty 90, 90d supply, fill #0

## 2024-04-03 ENCOUNTER — Other Ambulatory Visit (HOSPITAL_BASED_OUTPATIENT_CLINIC_OR_DEPARTMENT_OTHER): Payer: Self-pay

## 2024-09-22 ENCOUNTER — Ambulatory Visit: Admitting: Internal Medicine
